# Patient Record
Sex: Male | Born: 1945 | Race: White | Hispanic: No | Marital: Married | Smoking: Never smoker
Health system: Southern US, Community
[De-identification: ages and names within clinical notes are randomized; demographics above are authoritative.]

## PROBLEM LIST (undated history)

## (undated) DIAGNOSIS — N184 Chronic kidney disease, stage 4 (severe): Secondary | ICD-10-CM

## (undated) DIAGNOSIS — I469 Cardiac arrest, cause unspecified: Secondary | ICD-10-CM

## (undated) DIAGNOSIS — I10 Essential (primary) hypertension: Secondary | ICD-10-CM

## (undated) DIAGNOSIS — I2510 Atherosclerotic heart disease of native coronary artery without angina pectoris: Secondary | ICD-10-CM

## (undated) DIAGNOSIS — E039 Hypothyroidism, unspecified: Secondary | ICD-10-CM

## (undated) DIAGNOSIS — K219 Gastro-esophageal reflux disease without esophagitis: Secondary | ICD-10-CM

## (undated) DIAGNOSIS — E119 Type 2 diabetes mellitus without complications: Secondary | ICD-10-CM

## (undated) DIAGNOSIS — E785 Hyperlipidemia, unspecified: Secondary | ICD-10-CM

## (undated) DIAGNOSIS — R4182 Altered mental status, unspecified: Secondary | ICD-10-CM

## (undated) DIAGNOSIS — Z940 Kidney transplant status: Secondary | ICD-10-CM

## (undated) DIAGNOSIS — N400 Enlarged prostate without lower urinary tract symptoms: Secondary | ICD-10-CM

## (undated) DIAGNOSIS — C4490 Unspecified malignant neoplasm of skin, unspecified: Secondary | ICD-10-CM

## (undated) DIAGNOSIS — Z8619 Personal history of other infectious and parasitic diseases: Secondary | ICD-10-CM

## (undated) DIAGNOSIS — I639 Cerebral infarction, unspecified: Secondary | ICD-10-CM

## (undated) HISTORY — DX: Hypothyroidism, unspecified: E03.9

## (undated) HISTORY — DX: Altered mental status, unspecified: R41.82

## (undated) HISTORY — PX: KIDNEY TRANSPLANT: SHX239

## (undated) HISTORY — DX: Benign prostatic hyperplasia without lower urinary tract symptoms: N40.0

## (undated) HISTORY — DX: Cerebral infarction, unspecified: I63.9

## (undated) HISTORY — DX: Atherosclerotic heart disease of native coronary artery without angina pectoris: I25.10

## (undated) HISTORY — DX: Essential (primary) hypertension: I10

## (undated) HISTORY — DX: Type 2 diabetes mellitus without complications: E11.9

## (undated) HISTORY — PX: EYE SURGERY: SHX253

## (undated) HISTORY — DX: Hyperlipidemia, unspecified: E78.5

## (undated) HISTORY — DX: Unspecified malignant neoplasm of skin, unspecified: C44.90

---

## 1997-07-13 ENCOUNTER — Encounter: Payer: Self-pay | Admitting: Family Medicine

## 1998-07-13 ENCOUNTER — Encounter: Payer: Self-pay | Admitting: Family Medicine

## 1999-01-26 ENCOUNTER — Ambulatory Visit (HOSPITAL_COMMUNITY): Admission: RE | Admit: 1999-01-26 | Discharge: 1999-01-26 | Payer: Self-pay | Admitting: Family Medicine

## 1999-01-26 ENCOUNTER — Encounter: Payer: Self-pay | Admitting: Family Medicine

## 1999-07-14 ENCOUNTER — Encounter: Payer: Self-pay | Admitting: Family Medicine

## 1999-07-14 LAB — CONVERTED CEMR LAB: PSA: 1.4 ng/mL

## 2000-06-12 ENCOUNTER — Encounter: Payer: Self-pay | Admitting: Family Medicine

## 2000-06-12 LAB — CONVERTED CEMR LAB: PSA: 1.6 ng/mL

## 2001-07-13 ENCOUNTER — Encounter: Payer: Self-pay | Admitting: Family Medicine

## 2002-09-12 ENCOUNTER — Encounter: Payer: Self-pay | Admitting: Family Medicine

## 2002-09-12 HISTORY — PX: CARDIAC CATHETERIZATION: SHX172

## 2002-09-12 LAB — CONVERTED CEMR LAB: PSA: 1.7 ng/mL

## 2002-12-10 ENCOUNTER — Encounter: Payer: Self-pay | Admitting: *Deleted

## 2002-12-10 ENCOUNTER — Ambulatory Visit (HOSPITAL_COMMUNITY): Admission: RE | Admit: 2002-12-10 | Discharge: 2002-12-10 | Payer: Self-pay | Admitting: *Deleted

## 2003-12-12 ENCOUNTER — Encounter: Payer: Self-pay | Admitting: Family Medicine

## 2003-12-12 LAB — CONVERTED CEMR LAB: PSA: 1.3 ng/mL

## 2004-07-26 ENCOUNTER — Ambulatory Visit: Payer: Self-pay | Admitting: Family Medicine

## 2005-06-10 ENCOUNTER — Ambulatory Visit: Payer: Self-pay | Admitting: Family Medicine

## 2005-07-04 ENCOUNTER — Ambulatory Visit: Payer: Self-pay | Admitting: Family Medicine

## 2005-07-13 ENCOUNTER — Encounter: Payer: Self-pay | Admitting: Family Medicine

## 2005-07-18 ENCOUNTER — Ambulatory Visit: Payer: Self-pay | Admitting: Family Medicine

## 2005-07-25 ENCOUNTER — Ambulatory Visit: Payer: Self-pay | Admitting: Family Medicine

## 2005-08-23 ENCOUNTER — Ambulatory Visit: Payer: Self-pay | Admitting: Family Medicine

## 2005-11-22 ENCOUNTER — Ambulatory Visit: Payer: Self-pay | Admitting: Family Medicine

## 2005-11-24 ENCOUNTER — Ambulatory Visit: Payer: Self-pay | Admitting: Family Medicine

## 2006-02-14 ENCOUNTER — Ambulatory Visit: Payer: Self-pay | Admitting: Family Medicine

## 2006-02-24 ENCOUNTER — Ambulatory Visit: Payer: Self-pay | Admitting: Family Medicine

## 2006-03-27 ENCOUNTER — Ambulatory Visit: Payer: Self-pay | Admitting: Family Medicine

## 2006-05-08 ENCOUNTER — Ambulatory Visit: Payer: Self-pay | Admitting: Family Medicine

## 2006-06-02 ENCOUNTER — Ambulatory Visit: Payer: Self-pay | Admitting: Family Medicine

## 2006-06-07 ENCOUNTER — Emergency Department (HOSPITAL_COMMUNITY): Admission: EM | Admit: 2006-06-07 | Discharge: 2006-06-07 | Payer: Self-pay | Admitting: Emergency Medicine

## 2006-06-14 ENCOUNTER — Ambulatory Visit: Payer: Self-pay | Admitting: Otolaryngology

## 2006-06-20 ENCOUNTER — Ambulatory Visit: Payer: Self-pay | Admitting: Internal Medicine

## 2006-06-26 ENCOUNTER — Ambulatory Visit: Payer: Self-pay | Admitting: Family Medicine

## 2006-06-29 ENCOUNTER — Encounter: Payer: Self-pay | Admitting: Family Medicine

## 2006-07-03 ENCOUNTER — Ambulatory Visit: Payer: Self-pay | Admitting: Family Medicine

## 2006-07-13 ENCOUNTER — Encounter: Payer: Self-pay | Admitting: Family Medicine

## 2006-07-27 ENCOUNTER — Ambulatory Visit: Payer: Self-pay | Admitting: Family Medicine

## 2006-07-27 LAB — CONVERTED CEMR LAB
Microalbumin U total vol: 360.4 mg/L
PSA: 2.02 ng/mL

## 2006-07-31 ENCOUNTER — Ambulatory Visit: Payer: Self-pay | Admitting: Family Medicine

## 2006-08-10 ENCOUNTER — Ambulatory Visit: Payer: Self-pay | Admitting: Family Medicine

## 2006-08-11 ENCOUNTER — Ambulatory Visit: Payer: Self-pay | Admitting: Family Medicine

## 2006-09-12 ENCOUNTER — Encounter: Payer: Self-pay | Admitting: Family Medicine

## 2006-09-28 ENCOUNTER — Ambulatory Visit: Payer: Self-pay | Admitting: Family Medicine

## 2006-09-28 LAB — CONVERTED CEMR LAB
BUN: 29 mg/dL — ABNORMAL HIGH (ref 6–23)
CO2: 27 meq/L (ref 19–32)
Calcium: 9.6 mg/dL (ref 8.4–10.5)
Chloride: 105 meq/L (ref 96–112)
Creatinine, Ser: 2.8 mg/dL — ABNORMAL HIGH (ref 0.4–1.5)
GFR calc Af Amer: 30 mL/min
GFR calc non Af Amer: 25 mL/min
Glucose, Bld: 102 mg/dL — ABNORMAL HIGH (ref 70–99)
Hgb A1c MFr Bld: 6.3 % — ABNORMAL HIGH (ref 4.6–6.0)
Potassium: 4.5 meq/L (ref 3.5–5.1)
Sodium: 138 meq/L (ref 135–145)

## 2006-10-02 ENCOUNTER — Ambulatory Visit: Payer: Self-pay | Admitting: Family Medicine

## 2006-12-27 ENCOUNTER — Ambulatory Visit: Payer: Self-pay | Admitting: Family Medicine

## 2006-12-27 LAB — CONVERTED CEMR LAB
BUN: 35 mg/dL — ABNORMAL HIGH (ref 6–23)
CO2: 27 meq/L (ref 19–32)
Calcium: 9.3 mg/dL (ref 8.4–10.5)
Chloride: 108 meq/L (ref 96–112)
Creatinine, Ser: 3.1 mg/dL — ABNORMAL HIGH (ref 0.4–1.5)
GFR calc Af Amer: 27 mL/min
GFR calc non Af Amer: 22 mL/min
Glucose, Bld: 109 mg/dL — ABNORMAL HIGH (ref 70–99)
Hgb A1c MFr Bld: 6.7 %
Hgb A1c MFr Bld: 6.7 % — ABNORMAL HIGH (ref 4.6–6.0)
Potassium: 4.6 meq/L (ref 3.5–5.1)
Sodium: 140 meq/L (ref 135–145)

## 2007-01-01 ENCOUNTER — Ambulatory Visit: Payer: Self-pay | Admitting: Family Medicine

## 2007-03-29 ENCOUNTER — Ambulatory Visit: Payer: Self-pay | Admitting: Family Medicine

## 2007-03-29 DIAGNOSIS — E782 Mixed hyperlipidemia: Secondary | ICD-10-CM

## 2007-03-29 DIAGNOSIS — N400 Enlarged prostate without lower urinary tract symptoms: Secondary | ICD-10-CM

## 2007-03-29 DIAGNOSIS — E039 Hypothyroidism, unspecified: Secondary | ICD-10-CM | POA: Insufficient documentation

## 2007-03-29 DIAGNOSIS — E119 Type 2 diabetes mellitus without complications: Secondary | ICD-10-CM | POA: Insufficient documentation

## 2007-03-29 DIAGNOSIS — K649 Unspecified hemorrhoids: Secondary | ICD-10-CM | POA: Insufficient documentation

## 2007-03-29 DIAGNOSIS — I6350 Cerebral infarction due to unspecified occlusion or stenosis of unspecified cerebral artery: Secondary | ICD-10-CM | POA: Insufficient documentation

## 2007-03-29 DIAGNOSIS — Q613 Polycystic kidney, unspecified: Secondary | ICD-10-CM

## 2007-03-29 DIAGNOSIS — I2510 Atherosclerotic heart disease of native coronary artery without angina pectoris: Secondary | ICD-10-CM | POA: Insufficient documentation

## 2007-03-29 DIAGNOSIS — I10 Essential (primary) hypertension: Secondary | ICD-10-CM | POA: Insufficient documentation

## 2007-03-29 LAB — CONVERTED CEMR LAB
CO2: 27 meq/L (ref 19–32)
GFR calc Af Amer: 27 mL/min
Glucose, Bld: 92 mg/dL (ref 70–99)
Potassium: 4.7 meq/L (ref 3.5–5.1)
Sodium: 140 meq/L (ref 135–145)

## 2007-04-02 ENCOUNTER — Ambulatory Visit: Payer: Self-pay | Admitting: Family Medicine

## 2007-04-02 DIAGNOSIS — N529 Male erectile dysfunction, unspecified: Secondary | ICD-10-CM

## 2007-06-19 ENCOUNTER — Ambulatory Visit: Payer: Self-pay | Admitting: Family Medicine

## 2007-06-21 ENCOUNTER — Ambulatory Visit: Payer: Self-pay | Admitting: Family Medicine

## 2007-08-17 ENCOUNTER — Telehealth: Payer: Self-pay | Admitting: Family Medicine

## 2007-09-03 ENCOUNTER — Telehealth (INDEPENDENT_AMBULATORY_CARE_PROVIDER_SITE_OTHER): Payer: Self-pay | Admitting: *Deleted

## 2007-09-13 DIAGNOSIS — I639 Cerebral infarction, unspecified: Secondary | ICD-10-CM

## 2007-09-13 HISTORY — DX: Cerebral infarction, unspecified: I63.9

## 2007-09-17 ENCOUNTER — Telehealth: Payer: Self-pay | Admitting: Family Medicine

## 2007-10-04 ENCOUNTER — Ambulatory Visit: Payer: Self-pay | Admitting: Family Medicine

## 2007-10-04 LAB — CONVERTED CEMR LAB
Alkaline Phosphatase: 44 units/L (ref 39–117)
BUN: 36 mg/dL — ABNORMAL HIGH (ref 6–23)
Bilirubin, Direct: 0.1 mg/dL (ref 0.0–0.3)
CO2: 25 meq/L (ref 19–32)
Eosinophils Absolute: 0.3 10*3/uL (ref 0.0–0.6)
Eosinophils Relative: 6.1 % — ABNORMAL HIGH (ref 0.0–5.0)
GFR calc Af Amer: 24 mL/min
GFR calc non Af Amer: 20 mL/min
HCT: 38.5 % — ABNORMAL LOW (ref 39.0–52.0)
Hgb A1c MFr Bld: 6.2 % — ABNORMAL HIGH (ref 4.6–6.0)
LDL Cholesterol: 91 mg/dL (ref 0–99)
Microalb Creat Ratio: 343.2 mg/g — ABNORMAL HIGH (ref 0.0–30.0)
Microalb, Ur: 51.2 mg/dL — ABNORMAL HIGH (ref 0.0–1.9)
Monocytes Absolute: 0.5 10*3/uL (ref 0.2–0.7)
Neutro Abs: 3.1 10*3/uL (ref 1.4–7.7)
Potassium: 4.6 meq/L (ref 3.5–5.1)
RBC: 4.59 M/uL (ref 4.22–5.81)
RDW: 13.1 % (ref 11.5–14.6)
Total CHOL/HDL Ratio: 5.3
Total Protein: 6.5 g/dL (ref 6.0–8.3)
WBC: 5.1 10*3/uL (ref 4.5–10.5)

## 2007-10-09 ENCOUNTER — Ambulatory Visit: Payer: Self-pay | Admitting: Family Medicine

## 2007-10-17 ENCOUNTER — Telehealth (INDEPENDENT_AMBULATORY_CARE_PROVIDER_SITE_OTHER): Payer: Self-pay | Admitting: *Deleted

## 2007-10-18 ENCOUNTER — Ambulatory Visit: Payer: Self-pay | Admitting: Family Medicine

## 2007-10-18 LAB — FECAL OCCULT BLOOD, GUAIAC: Fecal Occult Blood: NEGATIVE

## 2007-10-19 ENCOUNTER — Encounter (INDEPENDENT_AMBULATORY_CARE_PROVIDER_SITE_OTHER): Payer: Self-pay | Admitting: *Deleted

## 2008-01-02 ENCOUNTER — Ambulatory Visit: Payer: Self-pay | Admitting: Family Medicine

## 2008-01-03 LAB — CONVERTED CEMR LAB
BUN: 40 mg/dL — ABNORMAL HIGH (ref 6–23)
CO2: 27 meq/L (ref 19–32)
Chloride: 104 meq/L (ref 96–112)
Creatinine, Ser: 3.8 mg/dL — ABNORMAL HIGH (ref 0.4–1.5)

## 2008-01-07 ENCOUNTER — Ambulatory Visit: Payer: Self-pay | Admitting: Family Medicine

## 2008-01-09 ENCOUNTER — Encounter: Payer: Self-pay | Admitting: Family Medicine

## 2008-01-21 ENCOUNTER — Telehealth: Payer: Self-pay | Admitting: Family Medicine

## 2008-06-25 ENCOUNTER — Ambulatory Visit: Payer: Self-pay | Admitting: Family Medicine

## 2008-06-26 LAB — CONVERTED CEMR LAB
ALT: 25 units/L (ref 0–53)
AST: 28 units/L (ref 0–37)
BUN: 39 mg/dL — ABNORMAL HIGH (ref 6–23)
Basophils Absolute: 0 10*3/uL (ref 0.0–0.1)
Basophils Relative: 0.9 % (ref 0.0–3.0)
CO2: 26 meq/L (ref 19–32)
Chloride: 107 meq/L (ref 96–112)
Creatinine, Ser: 3.6 mg/dL — ABNORMAL HIGH (ref 0.4–1.5)
Creatinine,U: 109 mg/dL
Eosinophils Relative: 6.9 % — ABNORMAL HIGH (ref 0.0–5.0)
LDL Cholesterol: 89 mg/dL (ref 0–99)
Lymphocytes Relative: 25.3 % (ref 12.0–46.0)
Monocytes Relative: 9.3 % (ref 3.0–12.0)
Neutrophils Relative %: 57.6 % (ref 43.0–77.0)
Phosphorus: 4.3 mg/dL (ref 2.3–4.6)
RBC: 4.48 M/uL (ref 4.22–5.81)
TSH: 2.08 microintl units/mL (ref 0.35–5.50)
Total Bilirubin: 1 mg/dL (ref 0.3–1.2)
Total CHOL/HDL Ratio: 5.5
VLDL: 38 mg/dL (ref 0–40)
Vit D, 1,25-Dihydroxy: 28 — ABNORMAL LOW (ref 30–89)
WBC: 4.9 10*3/uL (ref 4.5–10.5)

## 2008-06-30 ENCOUNTER — Ambulatory Visit: Payer: Self-pay | Admitting: Family Medicine

## 2008-08-05 ENCOUNTER — Encounter: Payer: Self-pay | Admitting: Family Medicine

## 2008-08-29 ENCOUNTER — Telehealth: Payer: Self-pay | Admitting: Family Medicine

## 2008-09-08 ENCOUNTER — Telehealth: Payer: Self-pay | Admitting: Family Medicine

## 2008-09-29 ENCOUNTER — Ambulatory Visit: Payer: Self-pay | Admitting: Family Medicine

## 2008-10-07 ENCOUNTER — Ambulatory Visit: Payer: Self-pay | Admitting: Family Medicine

## 2008-10-07 ENCOUNTER — Ambulatory Visit: Payer: Self-pay | Admitting: Internal Medicine

## 2008-10-13 ENCOUNTER — Ambulatory Visit: Payer: Self-pay | Admitting: Internal Medicine

## 2008-10-13 LAB — HM COLONOSCOPY

## 2008-11-24 ENCOUNTER — Encounter: Payer: Self-pay | Admitting: Family Medicine

## 2008-12-09 ENCOUNTER — Encounter: Payer: Self-pay | Admitting: Family Medicine

## 2008-12-10 ENCOUNTER — Encounter: Payer: Self-pay | Admitting: Family Medicine

## 2008-12-24 ENCOUNTER — Telehealth: Payer: Self-pay | Admitting: Family Medicine

## 2008-12-24 ENCOUNTER — Ambulatory Visit: Payer: Self-pay | Admitting: Family Medicine

## 2008-12-25 LAB — CONVERTED CEMR LAB
Calcium: 9.3 mg/dL (ref 8.4–10.5)
Chloride: 106 meq/L (ref 96–112)
Creatinine, Ser: 5.7 mg/dL (ref 0.4–1.5)
Hgb A1c MFr Bld: 6.7 % — ABNORMAL HIGH (ref 4.6–6.5)

## 2008-12-26 ENCOUNTER — Encounter: Payer: Self-pay | Admitting: Family Medicine

## 2008-12-29 ENCOUNTER — Ambulatory Visit: Payer: Self-pay | Admitting: Family Medicine

## 2009-03-03 ENCOUNTER — Encounter: Payer: Self-pay | Admitting: Family Medicine

## 2009-03-23 ENCOUNTER — Telehealth: Payer: Self-pay | Admitting: Family Medicine

## 2009-06-24 ENCOUNTER — Ambulatory Visit: Payer: Self-pay | Admitting: Family Medicine

## 2009-06-24 LAB — CONVERTED CEMR LAB
BUN: 39 mg/dL — ABNORMAL HIGH (ref 6–23)
Basophils Absolute: 0 10*3/uL (ref 0.0–0.1)
Cholesterol: 146 mg/dL (ref 0–200)
Creatinine, Ser: 4.1 mg/dL — ABNORMAL HIGH (ref 0.4–1.5)
Creatinine,U: 103.3 mg/dL
Eosinophils Absolute: 0.4 10*3/uL (ref 0.0–0.7)
GFR calc non Af Amer: 15.73 mL/min (ref >60)
HDL: 29.5 mg/dL — ABNORMAL LOW (ref >39.00)
Hgb A1c MFr Bld: 6.8 % — ABNORMAL HIGH (ref 4.6–6.5)
LDL Cholesterol: 85 mg/dL (ref 0–99)
Lymphocytes Relative: 14.5 % (ref 12.0–46.0)
Lymphs Abs: 1.1 10*3/uL (ref 0.7–4.0)
Microalb Creat Ratio: 514 mg/g — ABNORMAL HIGH (ref 0.0–30.0)
Monocytes Relative: 11.3 % (ref 3.0–12.0)
Phosphorus: 4.7 mg/dL — ABNORMAL HIGH (ref 2.3–4.6)
Platelets: 177 10*3/uL (ref 150.0–400.0)
RDW: 12 % (ref 11.5–14.6)
Total Bilirubin: 0.9 mg/dL (ref 0.3–1.2)
Triglycerides: 159 mg/dL — ABNORMAL HIGH (ref 0.0–149.0)
VLDL: 31.8 mg/dL (ref 0.0–40.0)

## 2009-06-25 LAB — CONVERTED CEMR LAB: Vit D, 25-Hydroxy: 27 ng/mL — ABNORMAL LOW (ref 30–89)

## 2009-07-01 ENCOUNTER — Ambulatory Visit: Payer: Self-pay | Admitting: Family Medicine

## 2009-07-03 ENCOUNTER — Telehealth: Payer: Self-pay | Admitting: Family Medicine

## 2009-07-28 ENCOUNTER — Telehealth: Payer: Self-pay | Admitting: Family Medicine

## 2009-08-31 ENCOUNTER — Telehealth: Payer: Self-pay | Admitting: Family Medicine

## 2009-09-15 ENCOUNTER — Encounter: Payer: Self-pay | Admitting: Family Medicine

## 2009-10-28 ENCOUNTER — Telehealth: Payer: Self-pay | Admitting: Family Medicine

## 2010-02-16 ENCOUNTER — Encounter: Payer: Self-pay | Admitting: Family Medicine

## 2010-04-15 ENCOUNTER — Encounter (INDEPENDENT_AMBULATORY_CARE_PROVIDER_SITE_OTHER): Payer: Self-pay | Admitting: *Deleted

## 2010-05-13 ENCOUNTER — Ambulatory Visit: Payer: Self-pay | Admitting: Oncology

## 2010-06-03 ENCOUNTER — Ambulatory Visit: Payer: Self-pay | Admitting: Oncology

## 2010-06-03 ENCOUNTER — Encounter: Payer: Self-pay | Admitting: Family Medicine

## 2010-06-12 ENCOUNTER — Ambulatory Visit: Payer: Self-pay | Admitting: Oncology

## 2010-06-23 ENCOUNTER — Ambulatory Visit: Payer: Self-pay | Admitting: Family Medicine

## 2010-06-23 ENCOUNTER — Telehealth (INDEPENDENT_AMBULATORY_CARE_PROVIDER_SITE_OTHER): Payer: Self-pay | Admitting: *Deleted

## 2010-06-23 LAB — CONVERTED CEMR LAB
AST: 30 units/L (ref 0–37)
Alkaline Phosphatase: 58 units/L (ref 39–117)
Basophils Absolute: 0 10*3/uL (ref 0.0–0.1)
Bilirubin, Direct: 0.1 mg/dL (ref 0.0–0.3)
Calcium: 9.2 mg/dL (ref 8.4–10.5)
Creatinine, Ser: 4 mg/dL — ABNORMAL HIGH (ref 0.4–1.5)
Creatinine,U: 93.2 mg/dL
Eosinophils Absolute: 0.2 10*3/uL (ref 0.0–0.7)
Free T4: 0.65 ng/dL (ref 0.60–1.60)
GFR calc non Af Amer: 16.09 mL/min (ref >60)
Glucose, Bld: 97 mg/dL (ref 70–99)
HDL: 32.6 mg/dL — ABNORMAL LOW (ref >39.00)
Hemoglobin: 11.8 g/dL — ABNORMAL LOW (ref 13.0–17.0)
Lymphocytes Relative: 16.2 % (ref 12.0–46.0)
MCHC: 33.6 g/dL (ref 30.0–36.0)
Microalb, Ur: 49.5 mg/dL — ABNORMAL HIGH (ref 0.0–1.9)
Monocytes Relative: 9.2 % (ref 3.0–12.0)
Neutro Abs: 3.4 10*3/uL (ref 1.4–7.7)
Neutrophils Relative %: 70.6 % (ref 43.0–77.0)
PSA: 2.58 ng/mL (ref 0.10–4.00)
Platelets: 195 10*3/uL (ref 150.0–400.0)
RDW: 13.3 % (ref 11.5–14.6)
Sodium: 141 meq/L (ref 135–145)
Total Bilirubin: 0.9 mg/dL (ref 0.3–1.2)
Triglycerides: 68 mg/dL (ref 0.0–149.0)
VLDL: 13.6 mg/dL (ref 0.0–40.0)

## 2010-06-24 LAB — CONVERTED CEMR LAB: Vit D, 25-Hydroxy: 36 ng/mL (ref 30–89)

## 2010-07-08 ENCOUNTER — Ambulatory Visit: Payer: Self-pay | Admitting: Family Medicine

## 2010-07-09 ENCOUNTER — Encounter: Payer: Self-pay | Admitting: Family Medicine

## 2010-07-12 ENCOUNTER — Encounter: Payer: Self-pay | Admitting: Family Medicine

## 2010-08-11 ENCOUNTER — Encounter: Payer: Self-pay | Admitting: Family Medicine

## 2010-09-20 ENCOUNTER — Encounter: Payer: Self-pay | Admitting: Family Medicine

## 2010-09-23 ENCOUNTER — Encounter: Payer: Self-pay | Admitting: Family Medicine

## 2010-09-23 LAB — CONVERTED CEMR LAB
ALT: 24 units/L
AST: 23 units/L
Albumin: 3.5 g/dL
Alkaline Phosphatase: 112 units/L
Calcium: 8.7 mg/dL
Chloride: 108 meq/L
Potassium: 4.6 meq/L
Sodium: 139 meq/L
Total Protein: 5.7 g/dL

## 2010-09-24 ENCOUNTER — Encounter: Payer: Self-pay | Admitting: Family Medicine

## 2010-10-01 ENCOUNTER — Encounter: Payer: Self-pay | Admitting: Family Medicine

## 2010-10-04 ENCOUNTER — Encounter: Payer: Self-pay | Admitting: Family Medicine

## 2010-10-06 ENCOUNTER — Encounter: Payer: Self-pay | Admitting: Family Medicine

## 2010-10-07 ENCOUNTER — Encounter: Payer: Self-pay | Admitting: Family Medicine

## 2010-10-11 ENCOUNTER — Encounter: Payer: Self-pay | Admitting: Family Medicine

## 2010-10-14 LAB — CONVERTED CEMR LAB
Albumin: 2.7 g/dL
Albumin: 2.8 g/dL
BUN: 56 mg/dL
Calcium: 8.3 mg/dL
Magnesium: 1.3 mg/dL
Phosphorus: 4.4 mg/dL
Total Protein: 4.8 g/dL
Total Protein: 4.9 g/dL

## 2010-10-14 NOTE — Progress Notes (Signed)
Summary: Pt requests phone call  Phone Note Call from Patient Call back at [phone]   Caller: Patient Call For: Shaune Leeks MD Summary of Call: Pt is asking that you call him regarding a personal matter. Initial call taken by: Lowella Petties CMA,  October 28, 2009 3:15 PM    New/Updated Medications: LEVITRA 5 MG TABS (VARDENAFIL HCL) one tab by mouth 1 hr prior to desired activity. Prescriptions: LEVITRA 5 MG TABS (VARDENAFIL HCL) one tab by mouth 1 hr prior to desired activity.  #5 x 0   Entered and Authorized by:   Shaune Leeks MD   Signed by:   Shaune Leeks MD on 10/28/2009   Method used:   Electronically to        [facility]  [employer] [phone]* (retail)       [address] [place], [place]  [address]       Ph: [phone] or [phone]       Fax: [phone]   RxID:   [id number]

## 2010-10-14 NOTE — Letter (Signed)
Summary: [facility]   Imported By: Lanelle Bal 10/01/2009 09:00:35  _____________________________________________________________________  External Attachment:    Type:   Image     Comment:   External Document

## 2010-10-14 NOTE — Letter (Signed)
Summary: Edward Oconnell letter  [place] at [facility]  [address] [place], [place] [address]   Phone: [phone]  Fax: [phone]       04/15/2010 MRN: [id number]  Edward Oconnell [address] [place], [place]  [address]  Dear Mr. Bernestine [facility] [place], and [facility] announce the retirement of Arta Silence, M.D., from full-time practice at the [facility] office effective March 11, 2010 and his plans of returning part-time.  It is important to Dr. Hetty Ely and to our practice that you understand that [facility] has seven physicians in our office for your health care needs.  We will continue to offer the same exceptional care that you have today.    Dr. Hetty Ely has spoken to many of you about his plans for retirement and returning part-time in the fall.   We will continue to work with you through the transition to schedule appointments for you in the office and meet the high standards that [place] is committed to.   Again, it is with great pleasure that we share the news that Dr. Hetty Ely will return to [facility] in October of 2011 with a reduced schedule.    If you have any questions, or would like to request an appointment with one of our physicians, please call us at [phone] and press the option for Scheduling an appointment.  We take pleasure in providing you with excellent patient care and look forward to seeing you at your next office visit.  Our [facility] Physicians are:  Tillman Abide, M.D. Laurita Quint, M.D. Roxy Manns, M.D. Kerby Nora, M.D. Hannah Beat, M.D. Ruthe Mannan, M.D. We proudly welcomed Raechel Ache, M.D. and Eustaquio Boyden, M.D. to the practice in July/August 2011.  Sincerely,  [facility]

## 2010-10-14 NOTE — Miscellaneous (Signed)
Summary: Lab results from [facility]  Clinical Lists Changes  Observations: Added new observation of PO4: 4.4 mg/dL (84/69/6295 28:41) Added new observation of ALBUMIN: 2.7 g/dL (32/44/0102 72:53) Added new observation of PROTEIN, TOT: 4.9 g/dL (66/44/0347 42:59) Added new observation of CREATININE: 2.9 mg/dL (56/38/7564 33:29) Added new observation of BUN: 56 mg/dL (51/88/4166 06:30) Added new observation of BG RANDOM: 198 mg/dL (16/09/930 35:57) Added new observation of CO2 PLSM/SER: 18 meq/L (10/04/2010 12:32) Added new observation of CL SERUM: 111 meq/L (10/04/2010 12:32) Added new observation of MAGNESIUM: 1.3 mg/dL (32/20/2542 70:62)

## 2010-10-14 NOTE — Miscellaneous (Signed)
Summary: Labs Magnesium and Phosphorus  Clinical Lists Changes  Observations: Added new observation of MAGNESIUM: 1.5 mg/dL (56/21/3086 57:84) Added new observation of PO4: 5.2 mg/dL (69/62/9528 41:32)

## 2010-10-14 NOTE — Letter (Signed)
Summary: Dr.Michael Daniel,Urology,Note  Dr.Michael Daniel,Urology,Note   Imported By: Beau Fanny 08/12/2010 10:18:08  _____________________________________________________________________  External Attachment:    Type:   Image     Comment:   External Document

## 2010-10-14 NOTE — Letter (Signed)
Summary: [facility]  [facility]   Imported By: Maryln Gottron 07/29/2010 13:48:56  _____________________________________________________________________  External Attachment:    Type:   Image     Comment:   External Document  Appended Document: [facility] Pls let pt know, am willing to try this but need to recheck Chol profile in 3 mos time.  Appended Document: [facility] Patient notified as instructed by telephone. Was informed by patient that he already has an appointment set up for the recheck.

## 2010-10-14 NOTE — Letter (Signed)
Summary: Dr.Mariana Murea,Nephrology,Note  Dr.Mariana Murea,Nephrology,Note   Imported By: Beau Fanny 02/25/2010 10:16:38  _____________________________________________________________________  External Attachment:    Type:   Image     Comment:   External Document

## 2010-10-14 NOTE — Assessment & Plan Note (Signed)
Summary: cpx/dlo   Vital Signs:  Patient profile:   65 year old male Height:      67.75 inches Weight:      181 pounds BMI:     27.82 Temp:     97.9 degrees F oral Pulse rate:   64 / minute Pulse rhythm:   regular BP sitting:   130 / 82  (left arm) Cuff size:   regular  Vitals Entered By: Sydell Axon LPN (July 08, 2010 9:27 AM) CC: 30 Minute checkup, had a colonoscopy 02/10 by Dr. Leone Payor   History of Present Illness: Pt here for Comp Exam. He has recently been seen by Nephrology and some of his meds have been changed. He has had impotence problems. He has tried Cialis etc with no effect.He has crazy dreams at times.   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  Problems Prior to Update: 1)  Special Screening Malignant Neoplasm of Prostate  (ICD-V76.44) 2)  Special Screening Malig Neoplasms Other Sites  (ICD-V76.49) 3)  Health Maintenance Exam  (ICD-V70.0) 4)  Organic Impotence  (ICD-607.84) 5)  Diabetes Mellitus, Type II  (ICD-250.00) 6)  Abnormal Blood Chemistry Nec  (ICD-790.6) 7)  Stroke  (ICD-434.91) 8)  Hemorrhoids  (ICD-455.6) 9)  Polycystic Kidney Disease With Renal Insuff.  (ICD-753.12) 10)  Hyperlipidemia, Mixed  (ICD-272.2) 11)  Benign Prostatic Hypertrophy  (ICD-600.00) 12)  Hypothyroidism  (ICD-244.9) 13)  Hypertension  (ICD-401.9) 14)  Coronary Artery Disease  (ICD-414.00)  Medications Prior to Update: 1)  Diovan 320 Mg Tabs (Valsartan) .... Take 1 Tablet By Mouth Once A Day 2)  Levothyroxine Sodium 137 Mcg Tabs (Levothyroxine Sodium) .... Take 1 Tablet By Mouth Once A Day 3)  Tenex 2 Mg Tabs (Guanfacine Hcl) .... Take 1 Tablet By Mouth Every Night 4)  Ranitidine Hcl 150 Mg Caps (Ranitidine Hcl) .... Take 1 Capsule Twice A  Day 5)  Lipitor 40 Mg Tabs (Atorvastatin Calcium) .... Take One By Mouth At Bedtime 6)  Zetia 10 Mg Tabs (Ezetimibe) .... Take One By Mouth Daily 7)  Sertraline Hcl 100 Mg Tabs (Sertraline Hcl) .... Take One By Mouth Daily 8)  Adult Aspirin Low Strength 81 Mg  Tbdp (Aspirin) .... Take 1 Tablet By Mouth Once A Day 9)  Amlodipine Besylate 10 Mg Tabs (Amlodipine Besylate) .... One Tab By Mouth Daily 10)  Tricor 145 Mg  Tabs (Fenofibrate) .Marland Kitchen.. 1 At Bedtime 11)  Lopressor 50 Mg  Tabs (Metoprolol Tartrate) .Marland Kitchen.. 1 Tablet Twice A Day 12)  Fexofenadine Hcl 180 Mg  Tabs (Fexofenadine Hcl) .Marland Kitchen.. 1 Daily As Needed 13)  Ayurcid .... Digestive Support 14)  Suhruday .... Daily By Mouth 15)  Jivanyog .... Immune Support 16)  Amrutras .... Metabolic Formula 17)  Dr. Amaryllis Dyke Healthy .... Kidney Function 18)  Pure Nutrient 950 Without Iron .... Daily 19)  Kidney Tone .Marland KitchenMarland Kitchen. 1 Daily By Mouth 20)  Uritone .Marland KitchenMarland Kitchen. 1 Daily 21)  Levitra 5 Mg Tabs (Vardenafil Hcl) .... One Tab By Mouth 1 Hr Prior To Desired Activity.  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  Other died from COPD with oxygen dependence. 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  Other died from COPD with oxygen dependence. CV- Sister MI x3 HBP- Father, mother, sister Cancer- + uncles ETOH- + Uncles  Review of Systems General:  Complains of fatigue; denies chills, fever, sweats, weakness, and weight loss. Eyes:  Denies blurring, discharge, and eye pain. ENT:  Denies decreased hearing, ear discharge, earache, and ringing in ears. CV:  Complains of fatigue and shortness of breath with exertion; denies chest pain or discomfort, fainting, palpitations, swelling of feet, and swelling of hands; chronic . Resp:  Denies cough, shortness of breath, and wheezing. GI:  Denies abdominal pain, bloody stools, change in bowel habits, constipation, dark tarry stools, diarrhea, indigestion, loss of appetite, nausea, vomiting, vomiting blood, and yellowish skin color. GU:  Complains of nocturia; denies dysuria, hematuria, and urinary frequency; three times a night, difficulty with erections.. MS:  Complains of cramps; denies joint pain, muscle aches, and  stiffness; occas. Derm:  Denies dryness, itching, and rash. Neuro:  Denies numbness, poor balance, tingling, and tremors.  Physical Exam  General:  Well-developed,well-nourished,in no acute distress; alert,appropriate and cooperative throughout examination. Head:  Normocephalic and atraumatic without obvious abnormalities. No apparent alopecia or balding. Sinuses NT. Eyes:  Conjunctiva clear bilaterally.  Ears:  External ear exam shows no significant lesions or deformities.  Otoscopic examination reveals clear canals, tympanic membranes are intact bilaterally without bulging, retraction, inflammation or discharge. Hearing is grossly normal bilaterally. Nose:  External nasal examination shows no deformity or inflammation. Nasal mucosa are pink and moist without lesions. Mild clear mucous. Mouth:  Oral mucosa and oropharynx without lesions or exudates.  Teeth in good repair. Clear thick PND. Neck:  No deformities, masses, or tenderness noted. Chest Wall:  No deformities, masses, tenderness or gynecomastia noted. Breasts:  No masses or gynecomastia noted Lungs:  Normal respiratory effort, chest expands symmetrically. Lungs are clear to auscultation, no crackles or wheezes. Heart:  Normal rate  and regular rhythm. S1 and S2 normal without gallop, murmur, click, rub or other extra sounds. Abdomen:  Bowel sounds positive,abdomen soft and non-tender without masses, organomegaly or hernias noted. Rectal:  No external abnormalities noted. Normal sphincter tone. No rectal masses or tenderness. G neg. Genitalia:  Testes bilaterally descended without nodularity, tenderness or masses. No scrotal masses or lesions. No penis lesions or urethral discharge. Prostate:  Prostate gland firm and smooth, no enlargement, nodularity, tenderness, mass, asymmetry or induration. 20-30 gms. Msk:  No deformity or scoliosis noted of thoracic or lumbar spine.   Pulses:  R and L carotid,radial,femoral,dorsalis pedis and  posterior tibial pulses are full and equal bilaterally Extremities:  No clubbing, cyanosis, edema, or deformity noted with normal full range of motion of all joints.   Neurologic:  No cranial nerve deficits noted. Station and gait are normal. Sensory, motor and coordinative functions appear intact. Skin:  Intact without suspicious lesions or rashes Cervical Nodes:  No lymphadenopathy noted Inguinal Nodes:  No significant adenopathy Psych:  Cognition and judgment appear intact. Alert and cooperative with normal attention span and concentration. No apparent delusions, illusions, hallucinations  Diabetes Management Exam:    Foot Exam (with socks and/or shoes not present):       Sensory-Monofilament:          Left foot: diminished          Right foot: diminished       Inspection:          Left foot: normal          Right foot: normal   Impression & Recommendations:  Problem # 1:  HEALTH MAINTENANCE EXAM (ICD-V70.0)  Reviewed preventive care protocols, scheduled due services, and updated immunizations. Flu shot today.  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 The following medications were removed from the medication list:    Levitra 5 Mg Tabs (Vardenafil hcl) ..... One tab by mouth 1 hr prior to desired activity.  Orders: Urology Referral (Urology)  Problem # 3:  DIABETES MELLITUS, TYPE II (ICD-250.00) Assessment: Improved  Great control. Cont diet and activity. The following medications were removed from the medication list:    Diovan 320 Mg Tabs (Valsartan) .Marland Kitchen... Take 1 tablet by mouth once a day His updated medication list for this problem includes:    Adult Aspirin Low Strength 81 Mg Tbdp (Aspirin) .Marland Kitchen... Take 1 tablet by mouth once a day  Labs Reviewed: Creat: 4.0 (06/23/2010)   Microalbumin: 360.4 (07/27/2006)  Last Eye Exam: normal (09/12/2006) Reviewed HgBA1c results: 5.9 (06/23/2010)  6.8 (06/24/2009)  Problem #  4:  STROKE (ICD-434.91) Assessment: Unchanged Stable.  His updated medication list for this problem includes:    Adult Aspirin Low Strength 81 Mg Tbdp (Aspirin) .Marland Kitchen... Take 1 tablet by mouth once a day  Problem # 5:  HEMORRHOIDS (ICD-455.6) Assessment: Unchanged Stable.  Problem # 6:  POLYCYSTIC KIDNEY DISEASE WITH RENAL INSUFF. (ICD-753.12) Assessment: Unchanged Nos slightly improved...wax and wane. Cont to  follow. Is on transplant list.  Problem # 7:  HYPERLIPIDEMIA, MIXED (ICD-272.2) HDL slightly low but other nos great! Cont curr tx. The following medications were removed from the medication list:    Tricor 145 Mg Tabs (Fenofibrate) .Marland Kitchen... 1 at bedtime His updated medication list for this problem includes:    Lipitor 40 Mg Tabs (Atorvastatin calcium) .Marland Kitchen... Take one by mouth at bedtime    Zetia 10 Mg Tabs (Ezetimibe) .Marland Kitchen... Take one by mouth daily  Labs Reviewed: SGOT: 30 (06/23/2010)   SGPT: 23 (06/23/2010)   HDL:32.60 (06/23/2010), 29.50 (06/24/2009)  LDL:68 (06/23/2010), 85 (06/24/2009)  Chol:114 (06/23/2010), 146 (06/24/2009)  Trig:68.0 (06/23/2010), 159.0 (06/24/2009)  Problem # 8:  BENIGN PROSTATIC HYPERTROPHY (ICD-600.00) Assessment: Unchanged Stable sxs.   Problem # 9:  HYPOTHYROIDISM (ICD-244.9) Assessment: Unchanged Therapeutic per labs and sxs. Cont curr dose. His updated medication list for this problem includes:    Levothyroxine Sodium 137 Mcg Tabs (Levothyroxine sodium) .Marland Kitchen... Take 1 tablet by mouth once a day  Labs Reviewed: TSH: 1.25 (06/23/2010)    HgBA1c: 5.9 (06/23/2010) Chol: 114 (06/23/2010)   HDL: 32.60 (06/23/2010)   LDL: 68 (06/23/2010)   TG: 68.0 (06/23/2010)  Problem # 10:  HYPERTENSION (ICD-401.9) Assessment: Unchanged Stable control. Cont curr meds. The following medications were removed from the medication list:    Diovan 320 Mg Tabs (Valsartan) .Marland Kitchen... Take 1 tablet by mouth once a day    Tenex 2 Mg Tabs (Guanfacine hcl) .Marland Kitchen... Take 1 tablet by  mouth every night    Lopressor 50 Mg Tabs (Metoprolol tartrate) .Marland Kitchen... 1 tablet twice a day His updated medication list for this problem includes:    Amlodipine Besylate 10 Mg Tabs (Amlodipine besylate) ..... One tab by mouth daily    Labetalol Hcl 300 Mg Tabs (Labetalol hcl) .Marland Kitchen... Take one by mouth every am    Hydralazine Hcl 50 Mg Tabs (Hydralazine hcl) .Marland Kitchen... Take one by mouth two times a day  BP today: 130/82 Prior BP: 112/80 (07/01/2009)  Labs Reviewed: K+: 4.8 (06/23/2010) Creat: : 4.0 (06/23/2010)   Chol: 114 (06/23/2010)   HDL: 32.60 (06/23/2010)   LDL: 68 (06/23/2010)   TG: 68.0 (06/23/2010)  Complete Medication List: 1)  Levothyroxine Sodium 137 Mcg Tabs (Levothyroxine sodium) .... Take 1 tablet by mouth once a day 2)  Ranitidine Hcl 150 Mg Caps (Ranitidine hcl) .... Take 1 capsule twice a day 3)  Lipitor 40 Mg Tabs (Atorvastatin calcium) .... Take one by mouth at bedtime 4)  Zetia 10 Mg Tabs (Ezetimibe) .... Take one by mouth daily 5)  Sertraline Hcl 100 Mg Tabs (Sertraline hcl) .... Take one by mouth daily 6)  Adult Aspirin Low Strength 81 Mg Tbdp (Aspirin) .... Take 1 tablet by mouth once a day 7)  Amlodipine Besylate 10 Mg Tabs (Amlodipine besylate) .... One tab by mouth daily 8)  Ayurcid  .... Digestive support 9)  Suhruday  .... Daily by mouth 10)  Jivanyog  .... Immune support 11)  Amrutras  .... Metabolic formula 12)  Dr. Amaryllis Dyke Healthy  .... Kidney function 13)  Pure Nutrient 950 Without Iron  .... Daily 14)  Kidney Tone  .Marland Kitchen.. 1 daily by mouth 15)  Uritone  .Marland Kitchen.. 1 daily 16)  Labetalol Hcl 300 Mg Tabs (Labetalol hcl) .... Take one by mouth every am 17)  Hydralazine Hcl 50 Mg Tabs (Hydralazine hcl) .... Take one by mouth two times a day  Patient Instructions: 1)  Flu shot today.  2)  Refer to Urology. 3)  RTC 6 mos, BMET prior. 4)  Get eye exam for diabetes.   Orders Added: 1)  Urology Referral [Urology] 2)  Est. Patient 40-64 years  [phone]    Prior Medications (reviewed today): LEVOTHYROXINE SODIUM 137 MCG TABS (LEVOTHYROXINE SODIUM) Take 1 tablet by mouth once a day RANITIDINE HCL 150 MG CAPS (RANITIDINE HCL) Take 1 capsule twice a day LIPITOR 40 MG TABS (ATORVASTATIN CALCIUM) Take one by mouth at bedtime ZETIA 10 MG  TABS (EZETIMIBE) Take one by mouth daily SERTRALINE HCL 100 MG TABS (SERTRALINE HCL) Take one by mouth daily ADULT ASPIRIN LOW STRENGTH 81 MG  TBDP (ASPIRIN) Take 1 tablet by mouth once a day AMLODIPINE BESYLATE 10 MG TABS (AMLODIPINE BESYLATE) one tab by mouth daily AYURCID () digestive support SUHRUDAY () DAILY BY MOUTH JIVANYOG () immune support AMRUTRAS () metabolic formula DR. PANKAI NARAMIS HEALTHY () kidney function PURE NUTRIENT 950 WITHOUT IRON () daily KIDNEY TONE () 1 daily by mouth URITONE () 1 daily LABETALOL HCL 300 MG TABS (LABETALOL HCL) Take one by mouth every am HYDRALAZINE HCL 50 MG TABS (HYDRALAZINE HCL) Take one by mouth two times a day Current Allergies (reviewed today): No known allergies

## 2010-10-14 NOTE — Progress Notes (Signed)
----   Converted from flag ---- ---- 06/23/2010 7:11 AM, Shaune Leeks MD wrote: PSA V76.44 A1C MICROALB 250.00 BMET CBC 753.12 CHOL PROF HEPATIC 272.2 TSH T4FR 244.9 VIT D 268.9  ---- 06/22/2010 2:37 PM, Melody Comas wrote: Patient is coming in for cpx labs tomorrow am. What labs to draw and diagnosis please. ------------------------------

## 2010-10-14 NOTE — Miscellaneous (Signed)
Summary: [id number]  Clinical Lists Changes Fk506  10.7 NG/ML  (5.0-20.0)  (10/01/10)

## 2010-10-14 NOTE — Miscellaneous (Signed)
Summary: Lab results from [facility]  Clinical Lists Changes  Observations: Added new observation of CALCIUM: 8.3 mg/dL (16/06/9603 54:09) Added new observation of ALBUMIN: 2.8 g/dL (81/19/1478 29:56) Added new observation of PROTEIN, TOT: 4.8 g/dL (21/30/8657 84:69) Added new observation of CREATININE: 3.2 mg/dL (62/95/2841 32:44) Added new observation of BUN: 57 mg/dL (09/14/7251 66:44)

## 2010-10-14 NOTE — Letter (Signed)
Summary: [facility]  [facility]   Imported By: Lanelle Bal 06/21/2010 10:56:04  _____________________________________________________________________  External Attachment:    Type:   Image     Comment:   External Document

## 2010-10-14 NOTE — Consult Note (Signed)
Summary: Dr.Michael Daniel,[facility],Note  Dr.Michael Daniel,[facility],Note   Imported By: Beau Fanny 07/15/2010 09:19:53  _____________________________________________________________________  External Attachment:    Type:   Image     Comment:   External Document

## 2010-10-14 NOTE — Miscellaneous (Signed)
  Clinical Lists Changes  Observations: Added new observation of MAGNESIUM: 1.5 mg/dL (16/06/9603 54:09) Added new observation of CALCIUM: 8.7 mg/dL (81/19/1478 29:56) Added new observation of ALBUMIN: 3.5 g/dL (21/30/8657 84:69) Added new observation of PROTEIN, TOT: 5.7 g/dL (62/95/2841 32:44) Added new observation of SGPT (ALT): 24 units/L (09/23/2010 17:14) Added new observation of SGOT (AST): 23 units/L (09/23/2010 17:14) Added new observation of ALK PHOS: 112 units/L (09/23/2010 17:14) Added new observation of BILI TOTAL: 0.8 mg/dL (09/14/7251 66:44) Added new observation of CREATININE: 2.33 mg/dL (03/47/4259 56:38) Added new observation of BUN: 28 mg/dL (75/64/3329 51:88) Added new observation of BG RANDOM: 83 mg/dL (41/66/0630 16:01) Added new observation of CO2 PLSM/SER: 20 meq/L (09/23/2010 17:14) Added new observation of CL SERUM: 108 meq/L (09/23/2010 17:14) Added new observation of K SERUM: 4.6 meq/L (09/23/2010 17:14) Added new observation of NA: 139 meq/L (09/23/2010 17:14)

## 2010-10-18 ENCOUNTER — Encounter: Payer: Self-pay | Admitting: Family Medicine

## 2010-10-18 LAB — CONVERTED CEMR LAB
BUN: 35 mg/dL
Magnesium: 1.4 mg/dL
Potassium: 5.6 meq/L

## 2010-10-19 ENCOUNTER — Encounter: Payer: Self-pay | Admitting: Family Medicine

## 2010-10-20 NOTE — Letter (Signed)
Summary: Transplant Clinic Note/[facility]  Transplant Clinic Note/[facility]   Imported By: Sherian Rein 10/13/2010 08:56:08  _____________________________________________________________________  External Attachment:    Type:   Image     Comment:   External Document

## 2010-10-20 NOTE — Letter (Signed)
Summary: Post-transplant/[facility]   Imported By: Lester Teaticket 10/11/2010 09:55:04  _____________________________________________________________________  External Attachment:    Type:   Image     Comment:   External Document  Appended Document: [facility]    Clinical Lists Changes  Observations: Added new observation of PAST SURG HX: ECHO LAE 03/99 Persantine Cardiolite wnl 11/98 MRI/MRA- mild atrophy, sm vess dz secondary HTN 05/00 Stress Cardiolite, + EKG03/23/04 CATH, 70% , LAD lesion x 2 12/10/02 CT Head , negative abnml 06/07/06 MRI Cerv Spine, disc degen o/w  06/14/06 CT Head nml 06/2007 Carotid/ 12/07 Renal U/S 08/21/06 Colonoscopy Int 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  (Dr Leone Payor) 10/13/08     10 yrs Stress Echo w/Doppler Nml [facility]) 12/09/08 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 L Vertebr Art [facility]) 12/09/08 Donor Kidney Transplant [facility] 09/12/10 (10/12/2010 12:40)       Past Surgical History:    ECHO LAE 03/99    Persantine Cardiolite wnl 11/98    MRI/MRA- mild atrophy, sm vess dz secondary HTN 05/00    Stress Cardiolite, + EKG03/23/04    CATH, 70% , LAD lesion x 2 12/10/02    CT Head , negative abnml 06/07/06    MRI Cerv Spine, disc degen o/w  06/14/06    CT Head nml 06/2007    Carotid/ 12/07    Renal U/S 08/21/06    Colonoscopy 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

## 2010-10-21 ENCOUNTER — Encounter: Payer: Self-pay | Admitting: Family Medicine

## 2010-10-21 LAB — CONVERTED CEMR LAB
Creatinine, Ser: 4.28 mg/dL
Phosphorus: 4.6 mg/dL
Total Protein: 5.5 g/dL

## 2010-10-26 ENCOUNTER — Encounter: Payer: Self-pay | Admitting: Family Medicine

## 2010-10-27 ENCOUNTER — Encounter: Payer: Self-pay | Admitting: Family Medicine

## 2010-10-27 LAB — CONVERTED CEMR LAB
BUN: 37 mg/dL
Creatinine, Ser: 3.84 mg/dL
Glucose, Bld: 164 mg/dL
Magnesium: 1.5 mg/dL
Phosphorus: 4 mg/dL
Total Protein: 5.8 g/dL

## 2010-10-28 ENCOUNTER — Encounter: Payer: Self-pay | Admitting: Family Medicine

## 2010-10-28 NOTE — Miscellaneous (Signed)
Summary: Lab results from [facility]  Clinical Lists Changes  Observations: Added new observation of MAGNESIUM: 1.4 mg/dL (45/40/9811 91:47) Added new observation of PO4: 4.7 mg/dL (82/95/6213 08:65) Added new observation of PROTEIN, TOT: 5.6 g/dL (78/46/9629 52:84) Added new observation of CREATININE: 4.04 mg/dL (13/24/4010 27:25) Added new observation of BUN: 35 mg/dL (36/64/4034 74:25) Added new observation of K SERUM: 5.6 meq/L (10/18/2010 13:55)

## 2010-11-03 NOTE — Miscellaneous (Signed)
Summary: Labs from [facility]  Clinical Lists Changes  Observations: Added new observation of MAGNESIUM: 1.5 mg/dL (14/78/2956 21:30) Added new observation of PO4: 4.0 mg/dL (86/57/8469 62:95) Added new observation of PROTEIN, TOT: 5.8 g/dL (28/41/3244 01:02) Added new observation of CREATININE: 3.84 mg/dL (72/53/6644 03:47) Added new observation of BUN: 37 mg/dL (42/59/5638 75:64) Added new observation of BG RANDOM: 164 mg/dL (33/29/5188 41:66)

## 2010-11-03 NOTE — Letter (Signed)
Summary: [facility]   [facility]   Imported By: Maryln Gottron 10/26/2010 15:40:59  _____________________________________________________________________  External Attachment:    Type:   Image     Comment:   External Document

## 2010-11-03 NOTE — Letter (Signed)
Summary: [facility] Discharge Summary  [facility] Discharge Summary   Imported By: Kassie Mends 10/29/2010 09:14:19  _____________________________________________________________________  External Attachment:    Type:   Image     Comment:   External Document

## 2010-11-03 NOTE — Miscellaneous (Signed)
Summary: Labs from [facility]  Clinical Lists Changes  Observations: Added new observation of MAGNESIUM: 1.3 mg/dL (16/06/9603 54:09) Added new observation of PO4: 4.6 mg/dL (81/19/1478 29:56) Added new observation of PROTEIN, TOT: 5.5 g/dL (21/30/8657 84:69) Added new observation of CREATININE: 4.28 mg/dL (62/95/2841 32:44) Added new observation of BUN: 36 mg/dL (09/14/7251 66:44) Added new observation of CO2 PLSM/SER: 18 meq/L (10/21/2010 14:31)

## 2010-11-09 ENCOUNTER — Encounter: Payer: Self-pay | Admitting: Family Medicine

## 2010-11-09 NOTE — Letter (Signed)
Summary: [facility]  [facility]   Imported By: Maryln Gottron 10/29/2010 13:09:38  _____________________________________________________________________  External Attachment:    Type:   Image     Comment:   External Document

## 2010-11-17 ENCOUNTER — Encounter: Payer: Self-pay | Admitting: Family Medicine

## 2010-11-18 NOTE — Letter (Signed)
Summary: Transplant Visit Note/[facility]  Transplant Visit Note/[facility]   Imported By: Sherian Rein 11/08/2010 09:58:16  _____________________________________________________________________  External Attachment:    Type:   Image     Comment:   External Document

## 2010-11-18 NOTE — Letter (Signed)
Summary: [facility]   Imported By: Kassie Mends 11/12/2010 08:38:50  _____________________________________________________________________  External Attachment:    Type:   Image     Comment:   External Document

## 2010-11-23 NOTE — Letter (Signed)
Summary: [facility]   Imported By: Kassie Mends 11/15/2010 10:10:38  _____________________________________________________________________  External Attachment:    Type:   Image     Comment:   External Document

## 2010-11-30 ENCOUNTER — Encounter: Payer: Self-pay | Admitting: Family Medicine

## 2010-12-08 ENCOUNTER — Encounter: Payer: Self-pay | Admitting: Family Medicine

## 2010-12-23 ENCOUNTER — Encounter: Payer: Self-pay | Admitting: Family Medicine

## 2010-12-28 ENCOUNTER — Ambulatory Visit: Payer: Medicare Other | Admitting: Oncology

## 2010-12-29 ENCOUNTER — Other Ambulatory Visit: Payer: Self-pay | Admitting: *Deleted

## 2010-12-29 DIAGNOSIS — I10 Essential (primary) hypertension: Secondary | ICD-10-CM

## 2011-01-10 ENCOUNTER — Other Ambulatory Visit: Payer: Self-pay

## 2011-01-11 ENCOUNTER — Ambulatory Visit: Payer: Medicare Other | Admitting: Oncology

## 2011-01-13 ENCOUNTER — Ambulatory Visit: Payer: Self-pay | Admitting: Family Medicine

## 2011-01-28 NOTE — Assessment & Plan Note (Signed)
[facility]                             [place] OFFICE NOTE   NAME:Edward Oconnell, ELIM ECONOMOU                        MRN:          [id number]  DATE:07/31/2006                            DOB:          [birth year]    A 65 year old white male here for a health maintenance physical exam.  He  really has no problems today but has developed dizziness approximately 2  weeks ago.  It did not last long, possibly 15 minutes.  It came and went  quickly.  It was caused by light through the trees causing a scotoma, which  made the patient get slightly sick and then he looked very quickly to his  right, which caused his acute symptoms.  I sent him to physical therapy  earlier in the year for the same problem and they were unable to induce the  symptoms and did not treat him.  He had a blood pressure taken at Dr.  Kandis Cocking office recently and cardiology where he had not been seen for a  while and his blood pressure was very high.  Dr. Alanda Amass wants to evaluate  the renal arteries and is scheduled for that on December 10.  The patient  really has no complaints today.  Has not been hospitalized lately, although  he has been in the emergency room for dizziness.  Has had a head CT and a  MRI of the head and neck which showed degenerative disk disease.  He has  been seen by ENT for the dizziness as well; none of which has really made a  difference.  His son has recently been diagnosed with kidney disease as  well.  His creatinine is 1.9.  The patient, himself, has been very stable  with his kidney disease.  He has not seen Dr. Elton Sin for over a year now.  Last evaluation was actually in February 2005.   PAST MEDICAL HISTORY:  Significant for:  1. Extreme labile hypertension since 1984.  2. Hypothyroidism since 1983.  3. Elevated cholesterol and triglycerides since at least 1998.  4. Polycystic kidney disease with renal insufficiency of longstanding.  5. Benign  prostatic hypertrophy.  6. Hemorrhoids.  7. Coronary artery disease with most recent catheterization in March 2004      which was basically stable.  8. Elevated glucose since November 2000 which has remained in the low 100      range.   The patient had a MRI of the cervical spine in October of this year which  showed degenerative disk disease, otherwise normal.  A CT of the head with  no abnormalities in September of this year.  His catheterization, as above,  was in March 2004 predicated because of his stress Cardiolite test that  showed positive EKG changes just prior to that.  He had a MRI/MRA of the  head in May 2000 which showed mild atrophy and small vessel disease due to  presumed hypertension and an echo in March 1999 that showed a left atrial  enlargement.  He denies rheumatic fever, Scarlet Fever, strep infection,  asthma, pneumonia,  diabetes, liver disease.  He has had hypothyroidism since  1983, as above.  Has no known drug allergies.  Has never smoked.  Does not  drink or use drugs.   His medicines include:  1. Metoprolol ER 25 mg daily.  2. Diltiazem XR 180 mg daily.  3. Diovan 320 mg daily.  4. Ranitidine 150 mg once daily.  5. Synthroid 0.137 mg daily.  6. Zetia 10 mg daily.  7. Sertraline 100 mg daily.  8. Lipitor 40 mg at night.  9. Tenex 2 mg at night.  10.TriCor 160 mg at night.   He has had some erectile dysfunction in the past.  Viagra has not helped  much.   REVIEW OF SYSTEMS:  Significant for rare headaches at this point; dizziness  of which is presumably labyrinthitis, now fleeting; and syncope with  elevated blood pressure once in the past and also another episode from  bleeding from the teeth with extraction.  He has had no sequelae from either  incident.  EYES:  Significant for RK surgery in 1992 and laser surgery again  in January 2002.  He was last seen in October of this year and was tested  for glaucoma.  Hearing is mildly decreased, checked at  his work.  He has  occasional ringing of the ears.  He has a partial lower denture with his  last dental exam last month.  He has shortness of breath at times with  extreme exertion.  He has occasional chest pain which resolves with burping  and occasional palpitations.  He has had a murmur in the past, echo was  normal.  He was a blue baby when born.  He has rare diarrhea with  occasional constipation and has had blood from the rectum with straining due  to hemorrhoids in the past.  He has rare reflux disease reasonably well  controlled now with Ranitidine.  He does have nocturia usually once a day  with urgency, dribbling, and rare incontinence with decreased stream.  Otherwise, head, eyes, ears, nose, and throat, teeth, and gums,  cardiorespiratory, gastrointestinal, and genitourinary systems are  noncontributory.   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  He is  married and lives with his wife.  Has 2 children out of the house.   FAMILY HISTORY:  Father is alive at the age of 61 with hypertension.  Mother  died at the age of 72 with renal failure, was on dialysis 3 times a week  when she died.  Two sisters deceased, 1 with 3 or 4 heart attacks and an  aneurysm and kidney disease with failure.  Other died from COPD with oxygen  dependence.   Last tetanus shot was in 2003.  He had a flu shot last year in 2006.   PHYSICAL EXAMINATION:  A well-developed, well-nourished, 65 year old white  male in no acute distress.  Temperature 97.3.  Pulse is 60.  Blood pressure 150/90.  A weight of 188 at  68 inches with no known allergies.  Blood pressure when rechecked was  140/85.  Conjunctivae were clear.  HEENT:  Within normal limits.  Hearing was normal.  NECK:  Without adenopathy.  Thyroid is without  nodularity. LUNGS:  Clear to auscultation.  BACK:  Straight, nontender with no CVA tenderness.  HEART:  Regular rate and rhythm without murmur being heard today.  Pulses  were 2+ throughout and carotids were without bruits.  CHEST:  Symmetric with good excursion.  ABDOMEN:  Soft, nontender.  Good bowel sounds and no masses.  No  lymphadenopathy was noted throughout.  GENITOURINARY:  Testicles were descended bilaterally without nodularity, no  hernias or adenopathy are noted.  RECTAL:  Normal tone.  Guaiac-negative stool.  No mass.  The prostate 20 g,  smooth, soft, symmetric.  Raphae intact without nodularity.  MUSCULOSKELETAL:  Muscle strength is 5/5 with range of motion, gait, and  mobility normal.  SKIN:  Significant for actinic keratosis and benign moles.   Laboratory data was discussed.  Metabolic panel was totally normal, except  for his sugar being 110, which is stable for him, and his BUN, creatinine  being 33 and 2.9, which is the best it has been.  Cholesterol profile:  His  triglycerides were 141 and his HDL was only 34.1 and his LDL was 91.  Microalbumin was 360.4 mg per gram, which is improved for him.  His  glomerular filtration rate calculator was 24 mL per minute.  A1c was 6.5.  His PSA was 2.02 which is mildly elevated and his thyroid functions were  euthyroid on his current dose of Synthroid.  BUN and creatinine since  September 2004 were 41 and 3, respectively; 49 and 3 in December 2004; March  2005, 40 and 3;  July 2005, 35 and 3; November 2006, 33 and 2.9; June 2007,  29 and 3.1; September 2007, 32 and 3; October 2007, 40 and 3.2; and November  2007, 33 and 2.9.   ASSESSMENT:  1. Health maintenance physical examination with labile hypertension with      chronic renal insufficiency and known polycystic disease which now      appears stable on his current regimen and, currently, BUN and      creatinine are the best it has been since September 2004.  Still has       slightly high hypertension and Dr. Alanda Amass has adjusted his medicines      which the patient has not yet done, but I think we will be okay.  So, I      have written for prescriptions for him to go from Cardizem XR 180 to      Norvasc 5 mg daily, #90, and 4 refills, and Toprol XL 50 mg daily, #90      and 4 refills.  Have his renal artery evaluation on December 10 as      scheduled and followup with Dr. Alanda Amass as requested.  2. Hypothyroidism, adequate and euthyroid on the current dose of iron 37      mcg per day.  A prescription was written for #90 and 4 refills.  3. Elevated cholesterol and triglycerides which are adequate.  Still try      to decrease his LDL further by regular exercise and trying to tighten      diet somewhat which we discussed.  4. Benign prostatic hypertrophy which is stable.  He gets up once at night      sometimes but not every night, which is the best it has been in quite      some time and we will follow. 5. Coronary artery disease, appears stable.  He just saw Dr. Alanda Amass,      will followup.  6. Elevated glucose, stable since November 2000.  Cautioned the patient to  continue to be compliant with decreased sweets and carbohydrates.      Return in 3 months for an appointment. Refer back to Dr. Elton Sin to be      reevaluated at [facility].   Prescriptions otherwise were written for all of his medications for 3 months  and 3 refills and see me sooner if required.     Arta Silence, MD  Electronically Signed    RNS/MedQ  DD: 07/31/2006  DT: 08/01/2006  Job #: [id number]   cc:   Gerlene Burdock A. Alanda Amass, M.D.

## 2011-01-28 NOTE — Cardiovascular Report (Signed)
NAME:  Edward Oconnell, Edward Oconnell                           ACCOUNT NO.:  [id number]   MEDICAL RECORD NO.:  [id number]                   PATIENT TYPE:  OIB   LOCATION:  2899                                 FACILITY:  [facility]   PHYSICIAN:  Veneda Melter, M.D. [facility]               DATE OF BIRTH:  April 06, 1946   DATE OF PROCEDURE:  12/10/2002  DATE OF DISCHARGE:                              CARDIAC CATHETERIZATION   PROCEDURES PERFORMED:  1. Left heart catheterization.  2. Selective coronary angiography.  3. Selective renal angiography.   DIAGNOSES:  1. Three-vessel coronary artery disease.  2. Mild renal artery stenosis.   HISTORY:  The patient is a 65 year old gentleman with polycystic kidney  disease, hypertension, hypothyroidism and dyslipidemia who presents for  assessment of abnormal Cardiolite.  The patient had mild scarring and  ischemia in the inferior apex.  He is referred for further cardiac  assessment.   TECHNIQUE:  Informed consent was obtained.  The patient was brought to the  catheterization laboratory.  A 6-French sheath was placed in the right  femoral artery using modified Seldinger technique.  A 6-French JL-4 and JR-4  catheter were then used to engage the left and right coronary arteries.  A  selective angiography was performed in various projections using manual  injection of contrast.  A JR-4 catheter was then used to engage the renal  arteries and again selective angiography was performed using manual  injection of contrast.  A 6-French pigtail catheter was then advanced to the  left ventricle and appropriate hemodynamics were obtained.  Left  ventriculogram was not performed due to mild renal insuffiencey.  At the  termination of the case, the catheters were removed, the patient was  enrolled in the matrix closure study and was randomized to standard  compression.  Manual pressure was applied and hemostasis was achieved.  The  patient was then transferred to the floor in  stable condition.  He tolerated  the procedure well.  A total of 45 cc of nonionic contrast was utilized.   FINDINGS:   CORONARY ANGIOGRAPHY:  1. Left main trunk, a large-caliber vessel is a proximal taper of 30%.  2. LAD.  This is a small-caliber vessel, provides a first diagonal branch in     the mid-section.  The LAD has severe narrowing of 70% of the proximal     segment.  Prior to the first septal perforator there is abnormal contour     suggesting an ulcerated plaque.  There is then a further high-grade     narrowing of 70% between the septal perforator and the first diagonal     branch.  There is moderate stenosis involving  the bifurcation with the     diagonal branch of 50%.  The distal LAD is a small-caliber vessel with     mild irregularities.  The first diagonal branch has an ostial narrowing  of 50% as noted.  Remainder of vessel has mild disease.  3. Left circumflex artery is dominant and is a medium caliber vessel prior     to a distal marginal branch as well as the posterior descending artery.     The ostium and proximal AV circumflex has a tubular narrowing of at least     50 to 60%.  There is then further disease of 30% in the mid AV     circumflex.  The PDA and distal marginal branch have mild irregularities.  4. Ramus intermedius.  This is a large-caliber vessel prior to the     anterolateral wall.  There is a long tubular narrowing of 70% in the     proximal segment.  5. Right coronary artery.  Non-dominant and a small-caliber vessel and gives     off an  RV marginal branch.   LEFT VENTRICULOGRAPHY:  1. Left ventriculogram was not performed.  2. LV pressure is 140/5, aortic is 140/85, LVEDP equals 10.   RENAL ARTERIOGRAPHY:  1. Left renal artery is a small-caliber vessel.  There is a mild tubular     narrowing of 30% in the proximal segment.  2. The right renal artery is a large-caliber vessel with luminal     irregularities.   ASSESSMENT AND PLAN:  The  patient is a 65 year old gentleman who presents  with advanced multivessel coronary artery disease.  Treatment options will  be discussed with the patient.                                               Veneda Melter, M.D. [facility]    NG/MEDQ  D:  12/10/2002  T:  12/10/2002  Job:  [id number]   cc:   Veneda Melter, M.D. [facility]  [address]  [place]  [place] [address]  Fax: [phone]   Laurita Quint, M.D.  [address] [place]  [place] [address]  Fax: [phone]

## 2011-01-28 NOTE — Discharge Summary (Signed)
NAME:  Edward Oconnell, Edward Oconnell                           ACCOUNT NO.:  [id number]   MEDICAL RECORD NO.:  [id number]                   PATIENT TYPE:  OIB   LOCATION:  3733                                 FACILITY:  [facility]   PHYSICIAN:  Richard A. Alanda Amass, M.D.          DATE OF BIRTH:  18-Mar-1946   DATE OF ADMISSION:  12/10/2002  DATE OF DISCHARGE:  12/10/2002                                 DISCHARGE SUMMARY   HISTORY OF PRESENT ILLNESS:  The patient came in for outpatient Cardiolite.  He apparently had been seen by Dr. Hetty Ely, who is his primary care  physician.  Screening Cardiolite test was done per patient's request for  past history of known coronary disease, seen by Dr. Corinda Gubler in the past.  This was done in the [facility] office.  A Cardiolite, showed some  minor abnormality.  He had no other abnormal stress tests with good exercise  tolerance, a borderline abnormal stress EKG, normal LV size and function.  On images, he had partial reversible defect at the apex that could represent  a small segment of myocardial ischemia.  Thus, he was recommended to undergo  cardiac catheterization.  This was performed by Dr. Chales Abrahams which revealed  disease in his LAD, 70 and 50% blockages in his left circumflex, 50% and 30%  blockages in his OD,  70% blockages. Left ventriculogram was not done  secondary to known renal insufficiency with a baseline creatinine.  He also  had left main proximal 30%.  His RCA was small and nondominant.  The  patient, at that point, asked to be seen by Dr. Alanda Amass, his primary  cardiologist.  At that point, we were requested to take care of the patient,  thus he was seen by Dr. Alanda Amass.  He felt that he did not have any  symptoms of angina.  He has known polycystic kidney disease long term with  abnormal creatinine.  He felt that his Cardiolite only had a minor  abnormality, thus, he felt the patient could go home.  Dr. Alanda Amass plans  on reviewing the  films and seeing the patient in the office and to discuss  treatment options with him at that time.   LABORATORY DATA:  On December 03, 2002, his hemoglobin was 14.8, hematocrit  42.7, wbc 6.9, platelets 260.  Sodium 140, potassium 4.1, BUN 24, creatinine  2.0.  He was treated with Mucomyst prior to and after his procedure.   DISCHARGE DIAGNOSES:  1. Atherosclerotic cardiovascular disease status post catheterization as     stated above.  Mildly abnormal Cardiolite.  2. Polycystic kidney disease with abnormal creatinine, chronic; had been see     by Dr. Cornelius Moras in the past.  Apparently Dr. Hetty Ely is his primary care     physician and also sees him for his polycystic kidney disease.  3. History of hyperlipidemia.  4. Hypertension, difficult to control in the past.  The patient states that     his current medications keep his blood pressure under well control.   DISCHARGE MEDICATIONS:  1. Diltiazem XR 240 mg once a day.  2. Hyzaar 100/25 every morning.  3. Synthroid 125 mcg once a day.  4. ________ 150 mg twice a day.  5. Zoloft 100 mg at bedtime.  6. Lipitor 10 mg every morning.  7. Cozaar 50 mg once a day.  8. Toprol XL 25 mg once a day.  9. Enteric coated aspirin 81 mg once a day.   ACTIVITY:  He should do no strenuous activity, no driving, no lifting for  three days.   DISCHARGE INSTRUCTIONS:  He should have a BMET drawn on December 12, 2002.   FOLLOW UP:  He will follow up with Dr. Alanda Amass in two weeks.  Our office  will call him with an appointment.     Lezlie Octave, N.P.                        Richard A. Alanda Amass, M.D.    BB/MEDQ  D:  12/10/2002  T:  12/11/2002  Job:  [id number]   cc:   Laurita Quint, M.D.  [address] [place]  [place] [address]  Fax: [phone]

## 2011-01-28 NOTE — H&P (Signed)
NAME:  Edward Oconnell, Edward Oconnell                           ACCOUNT NO.:  [id number]   MEDICAL RECORD NO.:  [id number]                   PATIENT TYPE:  OIB   LOCATION:  3733                                 FACILITY:  [facility]   PHYSICIAN:  Richard A. Alanda Amass, M.D.          DATE OF BIRTH:  [birth year]   DATE OF ADMISSION:  DATE OF DISCHARGE:                                HISTORY & PHYSICAL   HISTORY OF PRESENT ILLNESS:  The patient is a 65 year old male followed by  Dr. Lovey Newcomer who has seen Dr. Alanda Amass in the past with a history of  hypertension and polycystic kidney disease and chronic renal insufficiency.  He last saw Dr. Alanda Amass in 1998.  He has had a negative Cardiolite study,  this was in 11/98.  He recently saw Dr. Lovey Newcomer for health maintenance  physical.  He was set up for a Cardiolite study as a screening.  He was not  having chest pain.  The Cardiolite study was abnormal at [facility]  12/03/02 with evidence of partial reversibility in the apex.  He was admitted  for diagnostic catheterization.  The patient was admitted.  Dr. Jillyn Hidden  evaluated and [facility] cardiology by Dr. Daleen Squibb.  He received Mucomyst prior to  his catheterization.  The catheterization was done using dye for the cords  only, revealed 70% LAD, 70% ramus intermedius, and 50-60% circumflex.  After  the catheterization the patient requested Dr. Alanda Amass be called.  We are  seeing him now and taking him over on our service.  He tolerated the  catheterization well and is stable.   CURRENT MEDICATIONS:  1. Diltiazem 240 daily.  2. Hyzaar 10/25 daily.  3. Synthroid 0.125 mg a day.  4. Ranitidine 150 mg b.i.d.  5. Cozaar 50 mg a day.  6. Zoloft 100 mg h.s.  7. Lipitor 10 mg h.s.  8. Humibid LA b.i.d.   ALLERGIES:  No known drug allergies.   PAST SURGICAL HISTORY:  He has had previous laser surgery.   PAST MEDICAL HISTORY:  1. He has a history of polycystic kidney disease and was seen by renal     service in  the past.  He says his creatinine has been stable at 2 for     some time now.  2. Hyperlipidemia.  3. Hypothyroidism.   SOCIAL HISTORY:  He is married.  Retired in [employer].  He  has two children.  He does not smoke.   FAMILY HISTORY:  His father is alive at 60 and he also has a history of  hypertension.  His mother died in her 44s of renal failure.  He has one  sister who died of a heart attack and one sister died of COPD.   REVIEW OF SYSTEMS:  Essentially unremarkable except as noted above.   PHYSICAL EXAMINATION:  VITAL SIGNS: Blood pressure 126/56, heart rate 82,  temperature 97.3.  GENERAL: He is  a well-developed well-nourished man in no acute distress.  HEENT: Normocephalic.  Extraocular movements intact.  Sclerae are  nonicteric.  Conjunctivae are within normal limits.  NECK: Without bruit and without JVD.  CHEST: Clear to auscultation and percussion.  CARDIAC: Reveals regular rate and rhythm without murmurs, rubs, or gallops.  Normal S1 and S2.  ABDOMEN: Nontender.  No hepatosplenomegaly.  Bowel sounds are present.  EXTREMITIES: Without edema.  Peripheral pulses are 2+/4.  His right groin is  without hematoma.  NEUROLOGIC: Grossly intact.  He is awake, alert, oriented, and cooperative.  He moves all extremities without obvious deficit.   LABORATORY DATA:  EKG reveal sinus rhythm without acute changes.   IMPRESSION:  1. Abnormal Cardiolite study with moderate coronary disease at     catheterization.  2. Chronic renal insufficiency with a history of polycystic kidney disease     with a stable creatinine of about 2.1.  3. Hypertension, controlled.  4. Hypothyroidism.  5. History of benign prostatic hypertrophy.  6. Family history of coronary disease.   PLAN:  The patient sinographies will be reviewed by Dr. Alanda Amass.  We will  follow up a creatinine.  We have assumed his care.      Abelino Derrick, P.A.                      Richard A. Alanda Amass,  M.D.    Lenard Lance  D:  12/10/2002  T:  12/10/2002  Job:  [id number]   cc:   Jillyn Hidden B. Truett Perna, M.D.  [address] [facility]  [place]  [place]  [address]  Fax: [phone]

## 2011-02-11 ENCOUNTER — Ambulatory Visit: Payer: Medicare Other | Admitting: Oncology

## 2011-03-07 ENCOUNTER — Other Ambulatory Visit: Payer: Self-pay | Admitting: Family Medicine

## 2011-03-07 NOTE — Telephone Encounter (Signed)
Please verify dose with pt.  It appears he was on 137, no a day.  Let me know. Thanks.

## 2011-03-07 NOTE — Telephone Encounter (Signed)
Patient not seen in over one year for physical is it okay to fill?

## 2011-03-09 NOTE — Telephone Encounter (Signed)
Please schedule pt for CPE. 

## 2011-03-09 NOTE — Telephone Encounter (Signed)
Spoke with spouse and was advised that patient is taking of Synthroid.  Please advise.

## 2011-03-10 MED ORDER — LEVOTHYROXINE SODIUM 150 MCG PO TABS: 150.0000 ug | ORAL_TABLET | Freq: Every day | ORAL | Status: DC

## 2011-03-10 NOTE — Telephone Encounter (Signed)
Patient say that he is going on vacation next week and will call to schedule cpx when he gets back. Also asked for 90 day supply. Refilled #90 w/ 0 refills and canceled original script w/ pharmacy.

## 2011-03-25 ENCOUNTER — Other Ambulatory Visit: Payer: Self-pay | Admitting: *Deleted

## 2011-03-25 MED ORDER — SERTRALINE HCL 100 MG PO TABS: 100.0000 mg | ORAL_TABLET | Freq: Every day | ORAL | Status: DC

## 2011-04-27 ENCOUNTER — Ambulatory Visit: Payer: Medicare Other | Admitting: Oncology

## 2011-05-14 ENCOUNTER — Ambulatory Visit: Payer: Medicare Other | Admitting: Oncology

## 2011-06-12 ENCOUNTER — Other Ambulatory Visit: Payer: Self-pay | Admitting: Family Medicine

## 2011-06-13 ENCOUNTER — Other Ambulatory Visit: Payer: Self-pay | Admitting: *Deleted

## 2011-06-13 MED ORDER — LEVOTHYROXINE SODIUM 150 MCG PO TABS: 150.0000 ug | ORAL_TABLET | Freq: Every day | ORAL | Status: DC

## 2011-06-13 NOTE — Telephone Encounter (Signed)
Thyroid functions being followed with everything else through [facility].

## 2011-06-13 NOTE — Telephone Encounter (Signed)
Received faxed refill request from pharmacy. Is it okay to refill Synthroid?

## 2011-07-13 ENCOUNTER — Encounter: Payer: Self-pay | Admitting: Family Medicine

## 2011-07-13 ENCOUNTER — Ambulatory Visit (INDEPENDENT_AMBULATORY_CARE_PROVIDER_SITE_OTHER): Payer: Medicare Other | Admitting: Family Medicine

## 2011-07-13 DIAGNOSIS — Q613 Polycystic kidney, unspecified: Secondary | ICD-10-CM

## 2011-07-13 DIAGNOSIS — E039 Hypothyroidism, unspecified: Secondary | ICD-10-CM

## 2011-07-13 DIAGNOSIS — E782 Mixed hyperlipidemia: Secondary | ICD-10-CM

## 2011-07-13 DIAGNOSIS — I10 Essential (primary) hypertension: Secondary | ICD-10-CM

## 2011-07-13 DIAGNOSIS — E119 Type 2 diabetes mellitus without complications: Secondary | ICD-10-CM

## 2011-07-13 MED ORDER — LEVOTHYROXINE SODIUM 150 MCG PO TABS: 150.0000 ug | ORAL_TABLET | Freq: Every day | ORAL | Status: DC

## 2011-07-13 NOTE — Assessment & Plan Note (Signed)
S/P transplant that eventually did not take but kidney function hanging in there. He thinks the renal problem began with manipulation of BP meds. We discussed avoiding dehydration and not overdoing. Cont per [facility].

## 2011-07-13 NOTE — Patient Instructions (Addendum)
Discuss Zostavax with Nephrology folks at [facility]. I do not think a live vaccine a good idea with his current state of disease.

## 2011-07-13 NOTE — Assessment & Plan Note (Signed)
Very slightly high, meds controlled via [facility]. Not high enough here today to warrant readjustment. Will follow. BP Readings from Last 3 Encounters:  07/13/11 148/84  07/08/10 130/82  07/01/09 112/80

## 2011-07-13 NOTE — Progress Notes (Signed)
  Subjective:    Patient ID: Edward Oconnell, male    DOB: Jul 03, 1946, 65 y.o.   MRN: [id number]  HPI Pt here for Comp Exam. He has had kidney transplant with rejection and is again on the transplant list. He is followed at [facility] and seen routinely. He continues with his [employer], doing physically what he can but staying in there as he does not know how not to work and would go nuts without something to do. He looks remarkably well today and really has no complaints. "I am good as long as I can stay off that machine."    Review of Systems  Constitutional: Positive for fatigue (routinely). Negative for fever, chills, diaphoresis, appetite change and unexpected weight change.  HENT: Negative for hearing loss, ear pain, tinnitus and ear discharge.   Eyes: Positive for visual disturbance (getting weaker as time goes by.). Negative for pain and discharge.  Respiratory: Negative for cough, shortness of breath and wheezing.   Cardiovascular: Negative for chest pain and palpitations.       No SOB w/ exertion  Gastrointestinal: Negative for nausea, vomiting, abdominal pain, diarrhea, constipation and blood in stool.       No heartburn or swallowing problems.  Genitourinary: Negative for dysuria, frequency and difficulty urinating.       No nocturia  Musculoskeletal: Negative for myalgias, back pain and arthralgias.  Skin: Negative for rash.       No itching or dryness.  Neurological: Negative for tremors and numbness.       No tingling or balance problems.  Hematological: Negative for adenopathy. Does not bruise/bleed easily.  Psychiatric/Behavioral: Negative for dysphoric mood and agitation.       Objective:   Physical Exam  Constitutional: He is oriented to person, place, and time. He appears well-developed and well-nourished. No distress.  HENT:  Head: Normocephalic and atraumatic.  Right Ear: External ear normal.  Left Ear: External ear normal.  Nose: Nose normal.  Mouth/Throat:  Oropharynx is clear and moist.  Eyes: Conjunctivae and EOM are normal. Pupils are equal, round, and reactive to light. Right eye exhibits no discharge. Left eye exhibits no discharge. No scleral icterus.  Neck: Normal range of motion. Neck supple. No thyromegaly present.  Cardiovascular: Normal rate, regular rhythm, normal heart sounds and intact distal pulses.   No murmur heard. Pulmonary/Chest: Effort normal and breath sounds normal. No respiratory distress. He has no wheezes.  Abdominal: Soft. Bowel sounds are normal. He exhibits no distension and no mass. There is no tenderness. There is no rebound and no guarding.       Multiple lower abdominal incision scars.  Genitourinary:       No genitorectal exam.  Musculoskeletal: Normal range of motion. He exhibits no edema.  Lymphadenopathy:    He has no cervical adenopathy.  Neurological: He is alert and oriented to person, place, and time. Coordination normal.  Skin: Skin is warm and dry. No rash noted. He is not diaphoretic.  Psychiatric: He has a normal mood and affect. His behavior is normal. Judgment and thought content normal.          Assessment & Plan:

## 2011-07-13 NOTE — Assessment & Plan Note (Signed)
Also followed closely per [facility]. [place]far has done well with good A1Cs.

## 2011-07-13 NOTE — Assessment & Plan Note (Signed)
Was adjusteed recently via labs at [facility] by [facility] but he needs script written here, which has been done.

## 2011-07-13 NOTE — Assessment & Plan Note (Signed)
Again followed by [facility]. LAbs I have received have been good.

## 2011-09-15 ENCOUNTER — Other Ambulatory Visit: Payer: Self-pay | Admitting: Family Medicine

## 2011-09-16 MED ORDER — LEVOTHYROXINE SODIUM 150 MCG PO TABS: 150.0000 ug | ORAL_TABLET | Freq: Every day | ORAL | Status: DC

## 2011-09-16 NOTE — Telephone Encounter (Signed)
Addended by: Annamarie Major on: 09/16/2011 02:37 PM   Modules accepted: Orders

## 2011-09-18 ENCOUNTER — Other Ambulatory Visit: Payer: Self-pay | Admitting: Family Medicine

## 2011-09-21 DIAGNOSIS — Z85828 Personal history of other malignant neoplasm of skin: Secondary | ICD-10-CM | POA: Diagnosis not present

## 2011-09-21 DIAGNOSIS — L821 Other seborrheic keratosis: Secondary | ICD-10-CM | POA: Diagnosis not present

## 2011-09-28 DIAGNOSIS — N289 Disorder of kidney and ureter, unspecified: Secondary | ICD-10-CM | POA: Diagnosis not present

## 2011-09-29 DIAGNOSIS — N289 Disorder of kidney and ureter, unspecified: Secondary | ICD-10-CM | POA: Diagnosis not present

## 2011-10-04 DIAGNOSIS — N289 Disorder of kidney and ureter, unspecified: Secondary | ICD-10-CM | POA: Diagnosis not present

## 2011-10-04 DIAGNOSIS — Z79899 Other long term (current) drug therapy: Secondary | ICD-10-CM | POA: Diagnosis not present

## 2011-10-18 ENCOUNTER — Ambulatory Visit (INDEPENDENT_AMBULATORY_CARE_PROVIDER_SITE_OTHER): Payer: Medicare Other | Admitting: Family Medicine

## 2011-10-18 ENCOUNTER — Encounter: Payer: Self-pay | Admitting: Family Medicine

## 2011-10-18 DIAGNOSIS — R05 Cough: Secondary | ICD-10-CM

## 2011-10-18 NOTE — Patient Instructions (Signed)
Finish the avelox.  This should gradually improve.  Take care.  Let us know if you have other concerns.

## 2011-10-18 NOTE — Progress Notes (Signed)
Followed at [facility].  On immunosuppressives for renal transplant.    Started with post nasal gtt, ST, some cough.  No fevers.  Going on for about 1 week.  On avelox per renal at [facility].  Some better, "I feel all right" but the cough is still lingering.  Has 2 days left of avelox.  Not SOB unless with exertion, at baseline.  No CP.  No dysuria.  No abd pain, but some diarrhea recently.  Sugar has been controlled.   Meds, vitals, and allergies reviewed.   ROS: See HPI.  Otherwise, noncontributory.  nad Mmm Op wnl L TM with SOM, no erythema on either TM Minimal cobblestoning.  ctab rrr Abd soft No edema

## 2011-10-20 DIAGNOSIS — R05 Cough: Secondary | ICD-10-CM | POA: Insufficient documentation

## 2011-10-20 NOTE — Assessment & Plan Note (Signed)
Nontoxic, lungs ctab, I would finish the abx and then f/u prn. This should resolve.  He agrees, understood.

## 2011-10-31 DIAGNOSIS — Z0000 Encounter for general adult medical examination without abnormal findings: Secondary | ICD-10-CM | POA: Diagnosis not present

## 2011-11-14 DIAGNOSIS — Z79899 Other long term (current) drug therapy: Secondary | ICD-10-CM | POA: Diagnosis not present

## 2011-11-14 DIAGNOSIS — N185 Chronic kidney disease, stage 5: Secondary | ICD-10-CM | POA: Diagnosis not present

## 2011-11-14 DIAGNOSIS — E039 Hypothyroidism, unspecified: Secondary | ICD-10-CM | POA: Diagnosis not present

## 2011-11-14 DIAGNOSIS — I120 Hypertensive chronic kidney disease with stage 5 chronic kidney disease or end stage renal disease: Secondary | ICD-10-CM | POA: Diagnosis not present

## 2011-11-14 DIAGNOSIS — Q612 Polycystic kidney, adult type: Secondary | ICD-10-CM | POA: Diagnosis not present

## 2011-11-14 DIAGNOSIS — T8610 Unspecified complication of kidney transplant: Secondary | ICD-10-CM | POA: Diagnosis not present

## 2011-11-14 DIAGNOSIS — I10 Essential (primary) hypertension: Secondary | ICD-10-CM | POA: Diagnosis not present

## 2012-01-09 DIAGNOSIS — Z79899 Other long term (current) drug therapy: Secondary | ICD-10-CM | POA: Diagnosis not present

## 2012-01-09 DIAGNOSIS — T451X5A Adverse effect of antineoplastic and immunosuppressive drugs, initial encounter: Secondary | ICD-10-CM | POA: Diagnosis not present

## 2012-01-09 DIAGNOSIS — E785 Hyperlipidemia, unspecified: Secondary | ICD-10-CM | POA: Diagnosis not present

## 2012-01-09 DIAGNOSIS — N189 Chronic kidney disease, unspecified: Secondary | ICD-10-CM | POA: Diagnosis not present

## 2012-01-28 ENCOUNTER — Other Ambulatory Visit: Payer: Self-pay | Admitting: Family Medicine

## 2012-01-30 ENCOUNTER — Other Ambulatory Visit: Payer: Self-pay | Admitting: *Deleted

## 2012-01-30 MED ORDER — ATORVASTATIN CALCIUM 40 MG PO TABS: ORAL_TABLET | ORAL | Status: DC

## 2012-03-19 ENCOUNTER — Encounter: Payer: Self-pay | Admitting: Internal Medicine

## 2012-03-19 ENCOUNTER — Telehealth: Payer: Self-pay | Admitting: Family Medicine

## 2012-03-19 ENCOUNTER — Ambulatory Visit (INDEPENDENT_AMBULATORY_CARE_PROVIDER_SITE_OTHER): Payer: Medicare Other | Admitting: Internal Medicine

## 2012-03-19 VITALS — BP 160/94 | HR 68 | Temp 98.2°F | Ht 67.75 in | Wt 185.2 lb

## 2012-03-19 DIAGNOSIS — N433 Hydrocele, unspecified: Secondary | ICD-10-CM | POA: Insufficient documentation

## 2012-03-19 NOTE — Telephone Encounter (Signed)
Caller: Blease/Patient; PCP: Crawford Givens Clelia Croft); CB#: [phone];  Call regarding Swollen/Painful R. Scrotum; Onset: approx 02/17/12.   Afebrile.  Feels scalded.  Advised to see MD now for new marked swelling per Scrotum or Testicles Symptoms.  Appt scheduled for 1630 03/19/12 with Dr Alphonsus Sias.

## 2012-03-19 NOTE — Assessment & Plan Note (Signed)
Reassured that this is not worrisome Discussed proper support and padding of seat when on riding mower---traumatized area No treatment indicated

## 2012-03-19 NOTE — Progress Notes (Signed)
Subjective:    Patient ID: Edward Oconnell, male    DOB: 1946/04/23, 66 y.o.   MRN: [id number]  HPI Just at beach this weekend Seemed to get worse  Has swelling of right hemiscrotum Right side seems to draw up Worse since trip to beach  Some dull aching in right scrotum Not overly red Not warm No fever  No trouble with voiding--just goes frequently after diuretic No dysuria or hematuria Bowels have been okay. Gets sore near rectum. Some loose stools  Current Outpatient Prescriptions on File Prior to Visit  Medication Sig Dispense Refill  . aspirin 81 MG EC tablet Take 81 mg by mouth daily.        Marland Kitchen atorvastatin (LIPITOR) 40 MG tablet Take one by mouth Monday, Wednesday and Friday  90 tablet  1  . fenofibrate (TRICOR) 48 MG tablet Take 48 mg by mouth daily.        . Ferrous Gluconate (IRON) 240 (27 FE) MG TABS Take by mouth daily.        . furosemide (LASIX) 40 MG tablet Take 1 tablet by mouth Mon, Wed and Fri      . hydrALAZINE (APRESOLINE) 50 MG tablet Take 50 mg by mouth 4 (four) times daily.       Marland Kitchen levothyroxine (SYNTHROID) 150 MCG tablet Take 1 tablet (150 mcg total) by mouth daily.  90 tablet  3  . losartan (COZAAR) 25 MG tablet Take 25 mg by mouth 2 (two) times daily.        . Magnesium Chloride (SLOW-MAG PO) Take by mouth 2 (two) times daily.        . metoprolol tartrate (LOPRESSOR) 25 MG tablet Take 25 mg by mouth 2 (two) times daily.      . mycophenolate (MYFORTIC) 180 MG EC tablet Taking 2 tabs in Am and 1 tab in PM      . ranitidine (ZANTAC) 150 MG tablet Take 150 mg by mouth daily.      . sodium bicarbonate 650 MG tablet Take 650 mg by mouth 2 (two) times daily.        . tacrolimus (PROGRAF) 1 MG capsule Take 2 & 1/2 by mouth in AM and 3 tabs at bedtime.      Marland Kitchen DISCONTD: sertraline (ZOLOFT) 100 MG tablet TAKE 1 TABLET EVERY DAY  90 tablet  2    No Known Allergies  Past Medical History  Diagnosis Date  . CAD (coronary artery disease)     nonobstructive  .  Hypertension   . Hypothyroidism   . Benign prostatic hypertrophy   . Chronic kidney disease     s/p transplant per [facility]  . Hyperlipidemia   . Stroke     Past Surgical History  Procedure Date  . Kidney transplant     donor    Family History  Problem Relation Age of Onset  . Kidney failure Mother     was on dialysis 3 times a week when she died  . Alcohol abuse Mother   . Hypertension Father   . Heart attack Sister   . Kidney disease Sister   . Aneurysm Sister   . COPD Sister     History   Social History  . Marital Status: Married    Spouse Name: N/A    Number of Children: N/A  . Years of Education: N/A   Occupational History  . [employer] as a Nurse, adult     retired now totally  . part  owner in a [employer]    Social History Main Topics  . Smoking status: Never Smoker   . Smokeless tobacco: Never Used  . Alcohol Use: No  . Drug Use: No  . Sexually Active: Yes   Other Topics Concern  . Not on file   Social History Narrative  . No narrative on file   Review of Systems Kidney keeping him off dialysis but still not working well Frequently has nausea    Objective:   Physical Exam  Constitutional: He appears well-developed and well-nourished. No distress.  Genitourinary:       Swelling in right hemiscrotum Transilluminates No sig tenderness or warmth Slight redness over overlying skin          Assessment & Plan:

## 2012-03-19 NOTE — Telephone Encounter (Signed)
Noted  

## 2012-04-30 ENCOUNTER — Telehealth: Payer: Self-pay | Admitting: Family Medicine

## 2012-04-30 NOTE — Telephone Encounter (Signed)
He is going to be seeing his kidney doctor soon and needs blood work done.  He has orders from that doctor. Where he used to go is closed and he is wondering if he could have it done in our lab.  I told him I would leave message for the office to call him in morning and let him know.  He said he can't wait that long to take his Prograf so he plans to be here at 0800 in the morning.

## 2012-05-01 ENCOUNTER — Telehealth: Payer: Self-pay

## 2012-05-01 DIAGNOSIS — N189 Chronic kidney disease, unspecified: Secondary | ICD-10-CM | POA: Diagnosis not present

## 2012-05-01 NOTE — Telephone Encounter (Signed)
Pt seeing [facility] Dr this Thursday and request lab test done at [facility]. Pt has numerous lab test requested by Dr at [facility]; pt has signed orders from [facility] Dr. Algis Downs pt needs to go to [facility] drawing station (nearest one is off [address]). Pt verbalized understanding.

## 2012-05-03 DIAGNOSIS — T8610 Unspecified complication of kidney transplant: Secondary | ICD-10-CM | POA: Diagnosis not present

## 2012-05-03 DIAGNOSIS — I120 Hypertensive chronic kidney disease with stage 5 chronic kidney disease or end stage renal disease: Secondary | ICD-10-CM | POA: Diagnosis not present

## 2012-05-03 DIAGNOSIS — E785 Hyperlipidemia, unspecified: Secondary | ICD-10-CM | POA: Diagnosis not present

## 2012-05-03 DIAGNOSIS — E039 Hypothyroidism, unspecified: Secondary | ICD-10-CM | POA: Diagnosis not present

## 2012-05-03 DIAGNOSIS — Z79899 Other long term (current) drug therapy: Secondary | ICD-10-CM | POA: Diagnosis not present

## 2012-05-03 DIAGNOSIS — Q612 Polycystic kidney, adult type: Secondary | ICD-10-CM | POA: Diagnosis not present

## 2012-05-03 DIAGNOSIS — N185 Chronic kidney disease, stage 5: Secondary | ICD-10-CM | POA: Diagnosis not present

## 2012-05-03 DIAGNOSIS — Z940 Kidney transplant status: Secondary | ICD-10-CM | POA: Diagnosis not present

## 2012-05-25 DIAGNOSIS — N508 Other specified disorders of male genital organs: Secondary | ICD-10-CM | POA: Diagnosis not present

## 2012-05-25 DIAGNOSIS — N433 Hydrocele, unspecified: Secondary | ICD-10-CM | POA: Diagnosis not present

## 2012-05-29 ENCOUNTER — Telehealth: Payer: Self-pay

## 2012-05-29 NOTE — Telephone Encounter (Signed)
Will see tomorrow

## 2012-05-29 NOTE — Telephone Encounter (Signed)
Pt walked in with dull h/a since last night; when touches rt side of head or forehead pain is sharp to touch; pt had hx of shingles and felt similar; no breakout at this time. No dizziness, SOB or vision problem. Pt seen at [facility] last week and BP was OK. Pt scheduled 05/30/12 at 8:15 with Dr Sharen Hones. If condition changes or worsens pt will go to [facility] tonight.

## 2012-05-30 ENCOUNTER — Ambulatory Visit (INDEPENDENT_AMBULATORY_CARE_PROVIDER_SITE_OTHER): Payer: Medicare Other | Admitting: Family Medicine

## 2012-05-30 ENCOUNTER — Encounter: Payer: Self-pay | Admitting: Family Medicine

## 2012-05-30 VITALS — BP 144/94 | HR 72 | Temp 97.8°F | Wt 186.5 lb

## 2012-05-30 DIAGNOSIS — B029 Zoster without complications: Secondary | ICD-10-CM | POA: Insufficient documentation

## 2012-05-30 DIAGNOSIS — R51 Headache: Secondary | ICD-10-CM

## 2012-05-30 MED ORDER — VALACYCLOVIR HCL 1 G PO TABS: 1000.0000 mg | ORAL_TABLET | Freq: Three times a day (TID) | ORAL | Status: DC

## 2012-05-30 NOTE — Assessment & Plan Note (Signed)
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 No current eye involvement. Start valtrex 1gm tid right away for 7 days. Discussed red flags to seek urgent care or to notify us for urgent referral to optho if rash progresses.

## 2012-05-30 NOTE — Patient Instructions (Addendum)
I am worried that this may be shingles of eye which can be sight threatening.  Start valtrex three times daily for 7 days. If any vision change, or any worsening rash, please let us know to set you up with eye doctor right away. May use ibuprofen for discomfort.  Shingles Shingles is caused by the same virus that causes chickenpox (varicella zoster virus or VZV). Shingles often occurs many years or decades after having chickenpox. That is why it is more common in adults older than 50 years. The virus reactivates and breaks out as an infection in a nerve root. SYMPTOMS   The initial feeling (sensations) may be pain. This pain is usually described as:   Burning.   Stabbing.   Throbbing.   Tingling in the nerve root.   A red rash will follow in a couple days. The rash may occur in any area of the body and is usually on one side (unilateral) of the body in a band or belt-like pattern. The rash usually starts out as very small blisters (vesicles). They will dry up after 7 to 10 days. This is not usually a significant problem except for the pain it causes.   Long-lasting (chronic) pain is more likely in an elderly person. It can last months to years. This condition is called postherpetic neuralgia.  Shingles can be an extremely severe infection in someone with AIDS, a weakened immune system, or with forms of leukemia. It can also be severe if you are taking transplant medicines or other medicines that weaken the immune system. TREATMENT  Your caregiver will often treat you with:  Antiviral drugs.   Anti-inflammatory drugs.   Pain medicines.  Bed rest is very important in preventing the pain associated with herpes zoster (postherpetic neuralgia). Application of heat in the form of a hot water bottle or electric heating pad or gentle pressure with the hand is recommended to help with the pain or discomfort. PREVENTION  A varicella zoster vaccine is available to help protect against the virus.  The Food and Drug Administration approved the varicella zoster vaccine for individuals 46 years of age and older. HOME CARE INSTRUCTIONS   Cool compresses to the area of rash may be helpful.   Only take over-the-counter or prescription medicines for pain, discomfort, or fever as directed by your caregiver.   Avoid contact with:   Babies.   Pregnant women.   Children with eczema.   Elderly people with transplants.   People with chronic illnesses, such as leukemia and AIDS.   If the area involved is on your face, you may receive a referral for follow-up to a specialist. It is very important to keep all follow-up appointments. This will help avoid eye complications, chronic pain, or disability.  SEEK IMMEDIATE MEDICAL CARE IF:   You develop any pain (headache) in the area of the face or eye. This must be followed carefully by your caregiver or ophthalmologist. An infection in part of your eye (cornea) can be very serious. It could lead to blindness.   You do not have pain relief from prescribed medicines.   Your redness or swelling spreads.   The area involved becomes very swollen and painful.   You have a fever.   You notice any red or painful lines extending away from the affected area toward your heart (lymphangitis).   Your condition is worsening or has changed.  Document Released: 08/29/2005 Document Revised: 08/18/2011 Document Reviewed: 08/03/2009 [facility] Patient Information 2012 [place], [place].

## 2012-05-30 NOTE — Progress Notes (Signed)
  Subjective:    Patient ID: Edward Oconnell, male    DOB: [birth year], 66 y.o.   MRN: [id number]  HPI CC: headache  BP elevated today but hasn't taken AM meds yet.  H/o renal transplant, sees [facility].  1d h/o dull ache and stinging sensation from top of head down to around eyeball.  Only right sided.  Feels like "beesting".  Stinging and burning when touching head.  If doesn't touch, feels dull ache.  Has had shingles in past - on left eyebrow.  Several years ago.  This feels exactly the same.    No rashes anywhere that he's noticed.  No fevers/chills.  Today noticed R eye has dull ache as well.  No vision changes.  No abd pain, n/v.  Has not had shingles shot.  Review of Systems Per HPI    Objective:   Physical Exam  Nursing note and vitals reviewed. Constitutional: He appears well-developed and well-nourished. No distress.  HENT:  Head: Normocephalic and atraumatic.  Right Ear: Hearing, tympanic membrane, external ear and ear canal normal.  Left Ear: Hearing, tympanic membrane, external ear and ear canal normal.  Nose: Nose normal. No mucosal edema or rhinorrhea.  Mouth/Throat: Uvula is midline, oropharynx is clear and moist and mucous membranes are normal. No oropharyngeal exudate, posterior oropharyngeal edema, posterior oropharyngeal erythema or tonsillar abscesses.       Sensitive to touch across right forehead and right scalp  Eyes: Conjunctivae normal and EOM are normal. Pupils are equal, round, and reactive to light. No scleral icterus. Right eye exhibits normal extraocular motion. Left eye exhibits normal extraocular motion.  Neck: Normal range of motion. Neck supple.  Lymphadenopathy:    He has no cervical adenopathy.  Neurological: No cranial nerve deficit or sensory deficit.       CN 2-12 intact.       Assessment & Plan:

## 2012-06-01 DIAGNOSIS — B0232 Zoster iridocyclitis: Secondary | ICD-10-CM | POA: Diagnosis not present

## 2012-06-04 ENCOUNTER — Telehealth: Payer: Self-pay | Admitting: Family Medicine

## 2012-06-04 MED ORDER — HYDROCODONE-ACETAMINOPHEN 5-500 MG PO TABS: 1.0000 | ORAL_TABLET | Freq: Four times a day (QID) | ORAL | Status: DC | PRN

## 2012-06-04 NOTE — Telephone Encounter (Signed)
Caller: Edward Oconnell/Patient; Phone: [phone]; Reason for Call: Patient states was seen by Dr.  Sharen Hones last week for Shingles.  States the Tylenol is not helping with the pain and wants to know if Dr.  Sharen Hones would call something in that is stronger.  Please call patient back.  Thanks

## 2012-06-04 NOTE — Telephone Encounter (Signed)
Spoke with pt.  Taking tylenol, not touching pain.  No rash developed.  Taking valtrex.  Saw eye doctor, eye looked ok. Will prescribe vicodin.  If not better after a few days - to call us for trial of gabapentin.

## 2012-06-13 ENCOUNTER — Other Ambulatory Visit: Payer: Self-pay | Admitting: Family Medicine

## 2012-06-13 NOTE — Telephone Encounter (Signed)
Sent, will see 07/13/12.

## 2012-06-13 NOTE — Telephone Encounter (Signed)
Received refill request electronically. Last office visit 05/30/12. Is it okay to refill medication?

## 2012-06-14 ENCOUNTER — Encounter: Payer: Self-pay | Admitting: Family Medicine

## 2012-06-14 ENCOUNTER — Ambulatory Visit (INDEPENDENT_AMBULATORY_CARE_PROVIDER_SITE_OTHER): Payer: Medicare Other | Admitting: Family Medicine

## 2012-06-14 ENCOUNTER — Telehealth: Payer: Self-pay | Admitting: Family Medicine

## 2012-06-14 VITALS — BP 130/82 | HR 64 | Temp 98.2°F | Wt 185.5 lb

## 2012-06-14 DIAGNOSIS — B029 Zoster without complications: Secondary | ICD-10-CM | POA: Diagnosis not present

## 2012-06-14 MED ORDER — GABAPENTIN 300 MG PO CAPS: 300.0000 mg | ORAL_CAPSULE | Freq: Two times a day (BID) | ORAL | Status: DC

## 2012-06-14 MED ORDER — VALACYCLOVIR HCL 1 G PO TABS: 1000.0000 mg | ORAL_TABLET | Freq: Three times a day (TID) | ORAL | Status: DC

## 2012-06-14 MED ORDER — HYDROCODONE-ACETAMINOPHEN 5-500 MG PO TABS: 1.0000 | ORAL_TABLET | Freq: Three times a day (TID) | ORAL | Status: DC | PRN

## 2012-06-14 MED ORDER — OXYCODONE-ACETAMINOPHEN 5-325 MG PO TABS: 1.0000 | ORAL_TABLET | Freq: Three times a day (TID) | ORAL | Status: DC | PRN

## 2012-06-14 NOTE — Progress Notes (Signed)
  Subjective:    Patient ID: Edward Oconnell, male    DOB: August 14, 1946, 66 y.o.   MRN: [id number]  HPI CC:  F/u shingles  See prior note for details.  Briefly, seen here 05/30/2012, dx with atypical shingles and started on 7d course of valtrex 1gm tid.  Called in 5d later requesting stronger pain medicine - prescribed vicodin.  Finished valtrex course last week. Given location, there was concern for herpes zoster opthalmicus, so he saw his ophthalmologist as well.  returns today with continued constant headache, eye ache and some tooth ache.  This has been going on for 3 weeks now. Now spread of pain to right lateral nose. Also having some scaly flaking of skin on left forehead - this is only rash that has developed. vicodin helps pain for 1 hour then pain returns.  No fevers/chills.  Eye watery and matted in morning.  Some ST   H/o renal transplant, sees [facility].  On prograf. Had not had shingles shot.  Review of Systems Per HPI    Objective:   Physical Exam  Nursing note and vitals reviewed. Constitutional: He appears well-developed and well-nourished. No distress.  HENT:  Head: Normocephalic and atraumatic.    Right Ear: Hearing, tympanic membrane, external ear and ear canal normal.  Left Ear: Hearing, tympanic membrane, external ear and ear canal normal.  Nose: Nose normal. No mucosal edema or rhinorrhea.  Mouth/Throat: Uvula is midline, oropharynx is clear and moist and mucous membranes are normal. No oropharyngeal exudate, posterior oropharyngeal edema, posterior oropharyngeal erythema or tonsillar abscesses.       Sensitive to touch across right forehead and right scalp, now sensitive periorbitally and right nasal bridge  Eyes: Conjunctivae normal and EOM are normal. Pupils are equal, round, and reactive to light. No scleral icterus. Right eye exhibits normal extraocular motion. Left eye exhibits normal extraocular motion.  Neck: Normal range of motion. Neck supple.    Lymphadenopathy:    He has no cervical adenopathy.  Neurological: No cranial nerve deficit or sensory deficit.       CN 2-12 intact.  Skin: Skin is warm and dry. Rash noted.       Papular rash across right forehead and evident at right nasal bridge.  Not vesicular.  tender       Assessment & Plan:

## 2012-06-14 NOTE — Telephone Encounter (Signed)
Seen today. 

## 2012-06-14 NOTE — Telephone Encounter (Signed)
Caller: [name]/Patient; Patient Name: Edward Oconnell; PCP: Eustaquio Boyden [facility]); Best Callback Phone Number: [phone]. States that he was in office approximately 3 weeks ago and treated for Shingles. Is out of medication and is still having symptoms. Still having Shingles to forehead and is moving down face to eyebrow and down right side of nose. Painful. Burns. Thinks he has a fever today. Sore throat. "It feels like a toothache, headache and a earache." Decreased appetite. All emergent symptoms per Skin Lesions protocol ruled out. Call provider immediately disposition. Made appointment for today at 1545 with Dr. Oren Section.

## 2012-06-14 NOTE — Patient Instructions (Addendum)
I'm sorry this isn't getting better as quickly as we had hoped for. Do another course of valcyclovir for 7 more days. Take percocets as needed. Start gabapentin at night time for 4 days then twice daily to see if this will help nerve pain. Keep appt tomorrow with eye doctor.

## 2012-06-14 NOTE — Assessment & Plan Note (Signed)
Evident rash along trigeminal V1 distribution, however papular so not typical of herpes zoster. Now going on 3 wks. With eye involvement, anticipate herpes zoster ophthalmicus, however did start valtrex early. Has appt with ophtho tomorrow morning for f/u. Given spread, will prescribe another valtrex course, increase analgesic to vicodin, and start gabapentin.  Discussed sedation precautions with percocets and dizzy precautions with gabapentin. Concern for post herpetic neuralgia.

## 2012-06-15 DIAGNOSIS — B0232 Zoster iridocyclitis: Secondary | ICD-10-CM | POA: Diagnosis not present

## 2012-06-26 ENCOUNTER — Telehealth: Payer: Self-pay

## 2012-06-26 NOTE — Telephone Encounter (Signed)
Pt finished med for shingles last week; pt has taken 2 prescriptions of shingles med. Pt is presently taking steroid 4 times a day per eye dr. Rock Nephew has f/u appt to see eye dr on 06/29/12. Pt can touch forehead without pain now; but right eye ,head and side of neck with difficulty swallowing still bothers pt.Please advise. [facility].

## 2012-06-26 NOTE — Telephone Encounter (Signed)
Spoke with pt - having ST with swallowing.  Recommended ibuprofen for this.  May try increased gabapentin to tid for discomfort around eye from shingles rash.  To call us with update.

## 2012-06-29 DIAGNOSIS — B0232 Zoster iridocyclitis: Secondary | ICD-10-CM | POA: Diagnosis not present

## 2012-07-06 DIAGNOSIS — B0232 Zoster iridocyclitis: Secondary | ICD-10-CM | POA: Diagnosis not present

## 2012-07-13 ENCOUNTER — Encounter: Payer: Medicare Other | Admitting: Family Medicine

## 2012-07-13 DIAGNOSIS — B0232 Zoster iridocyclitis: Secondary | ICD-10-CM | POA: Diagnosis not present

## 2012-07-14 ENCOUNTER — Other Ambulatory Visit: Payer: Self-pay | Admitting: Family Medicine

## 2012-07-15 NOTE — Telephone Encounter (Signed)
I don't see where this Rx originated; clarify with pharmacy about originating MD.  Thanks.

## 2012-07-15 NOTE — Telephone Encounter (Signed)
To pcp.  i don't think i've prescribed this for recent shingles.

## 2012-07-16 NOTE — Telephone Encounter (Signed)
Spoke to pharmacist and was advised that this was originally written by Dr. Rosalee Kaufman 12/05/10.

## 2012-07-17 NOTE — Telephone Encounter (Signed)
Okay to send for 1 month, needs OV to ext with new MD.

## 2012-07-18 NOTE — Telephone Encounter (Signed)
Patient notified as instructed by telephone. Was advised that he has a physical scheduled in January with Dr. Para March.

## 2012-07-20 DIAGNOSIS — B0232 Zoster iridocyclitis: Secondary | ICD-10-CM | POA: Diagnosis not present

## 2012-07-25 ENCOUNTER — Other Ambulatory Visit: Payer: Self-pay | Admitting: Family Medicine

## 2012-07-26 DIAGNOSIS — Z48298 Encounter for aftercare following other organ transplant: Secondary | ICD-10-CM | POA: Diagnosis not present

## 2012-07-26 DIAGNOSIS — Z79899 Other long term (current) drug therapy: Secondary | ICD-10-CM | POA: Diagnosis not present

## 2012-07-26 DIAGNOSIS — E785 Hyperlipidemia, unspecified: Secondary | ICD-10-CM | POA: Diagnosis not present

## 2012-07-26 DIAGNOSIS — N185 Chronic kidney disease, stage 5: Secondary | ICD-10-CM | POA: Diagnosis not present

## 2012-07-26 DIAGNOSIS — T8610 Unspecified complication of kidney transplant: Secondary | ICD-10-CM | POA: Diagnosis not present

## 2012-07-26 DIAGNOSIS — E039 Hypothyroidism, unspecified: Secondary | ICD-10-CM | POA: Diagnosis not present

## 2012-07-26 DIAGNOSIS — Z23 Encounter for immunization: Secondary | ICD-10-CM | POA: Diagnosis not present

## 2012-08-03 DIAGNOSIS — B0232 Zoster iridocyclitis: Secondary | ICD-10-CM | POA: Diagnosis not present

## 2012-08-12 DIAGNOSIS — Z8619 Personal history of other infectious and parasitic diseases: Secondary | ICD-10-CM

## 2012-08-12 HISTORY — DX: Personal history of other infectious and parasitic diseases: Z86.19

## 2012-08-24 DIAGNOSIS — B0232 Zoster iridocyclitis: Secondary | ICD-10-CM | POA: Diagnosis not present

## 2012-08-29 ENCOUNTER — Other Ambulatory Visit: Payer: Self-pay

## 2012-08-29 DIAGNOSIS — Z79899 Other long term (current) drug therapy: Secondary | ICD-10-CM | POA: Diagnosis not present

## 2012-08-29 DIAGNOSIS — N189 Chronic kidney disease, unspecified: Secondary | ICD-10-CM | POA: Diagnosis not present

## 2012-08-29 LAB — BASIC METABOLIC PANEL
Anion Gap: 8 (ref 7–16)
BUN: 43 mg/dL — ABNORMAL HIGH (ref 7–18)
Calcium, Total: 9.2 mg/dL (ref 8.5–10.1)
Chloride: 106 mmol/L (ref 98–107)
Co2: 24 mmol/L (ref 21–32)
Creatinine: 2.92 mg/dL — ABNORMAL HIGH (ref 0.60–1.30)
EGFR (African American): 25 — ABNORMAL LOW
EGFR (Non-African Amer.): 21 — ABNORMAL LOW
Osmolality: 287 (ref 275–301)
Potassium: 4.5 mmol/L (ref 3.5–5.1)

## 2012-09-01 ENCOUNTER — Other Ambulatory Visit: Payer: Self-pay | Admitting: Family Medicine

## 2012-09-01 DIAGNOSIS — Z125 Encounter for screening for malignant neoplasm of prostate: Secondary | ICD-10-CM

## 2012-09-01 DIAGNOSIS — E119 Type 2 diabetes mellitus without complications: Secondary | ICD-10-CM

## 2012-09-01 DIAGNOSIS — N400 Enlarged prostate without lower urinary tract symptoms: Secondary | ICD-10-CM

## 2012-09-10 ENCOUNTER — Other Ambulatory Visit (INDEPENDENT_AMBULATORY_CARE_PROVIDER_SITE_OTHER): Payer: Medicare Other

## 2012-09-10 DIAGNOSIS — Z125 Encounter for screening for malignant neoplasm of prostate: Secondary | ICD-10-CM

## 2012-09-10 DIAGNOSIS — E119 Type 2 diabetes mellitus without complications: Secondary | ICD-10-CM

## 2012-09-10 LAB — COMPREHENSIVE METABOLIC PANEL
Albumin: 3.8 g/dL (ref 3.5–5.2)
Alkaline Phosphatase: 77 U/L (ref 39–117)
BUN: 35 mg/dL — ABNORMAL HIGH (ref 6–23)
Creatinine, Ser: 2.8 mg/dL — ABNORMAL HIGH (ref 0.4–1.5)
Glucose, Bld: 163 mg/dL — ABNORMAL HIGH (ref 70–99)
Total Bilirubin: 0.7 mg/dL (ref 0.3–1.2)

## 2012-09-10 LAB — LIPID PANEL
Cholesterol: 146 mg/dL (ref 0–200)
HDL: 26.6 mg/dL — ABNORMAL LOW (ref >39.00)
Triglycerides: 261 mg/dL — ABNORMAL HIGH (ref 0.0–149.0)

## 2012-09-10 LAB — LDL CHOLESTEROL, DIRECT: Direct LDL: 73.3 mg/dL

## 2012-09-10 LAB — HEMOGLOBIN A1C: Hgb A1c MFr Bld: 6.2 % (ref 4.6–6.5)

## 2012-09-10 LAB — PSA, MEDICARE: PSA: 3.4 ng/ml (ref 0.10–4.00)

## 2012-09-10 LAB — TSH: TSH: 0.88 u[IU]/mL (ref 0.35–5.50)

## 2012-09-14 ENCOUNTER — Encounter: Payer: Self-pay | Admitting: Family Medicine

## 2012-09-14 ENCOUNTER — Ambulatory Visit (INDEPENDENT_AMBULATORY_CARE_PROVIDER_SITE_OTHER): Payer: Medicare Other | Admitting: Family Medicine

## 2012-09-14 VITALS — BP 138/84 | HR 59 | Temp 97.5°F | Ht 68.0 in | Wt 186.0 lb

## 2012-09-14 DIAGNOSIS — E782 Mixed hyperlipidemia: Secondary | ICD-10-CM

## 2012-09-14 DIAGNOSIS — Z0000 Encounter for general adult medical examination without abnormal findings: Secondary | ICD-10-CM | POA: Diagnosis not present

## 2012-09-14 DIAGNOSIS — I10 Essential (primary) hypertension: Secondary | ICD-10-CM | POA: Diagnosis not present

## 2012-09-14 DIAGNOSIS — E119 Type 2 diabetes mellitus without complications: Secondary | ICD-10-CM

## 2012-09-14 DIAGNOSIS — Q613 Polycystic kidney, unspecified: Secondary | ICD-10-CM

## 2012-09-14 DIAGNOSIS — E039 Hypothyroidism, unspecified: Secondary | ICD-10-CM | POA: Diagnosis not present

## 2012-09-14 NOTE — Patient Instructions (Addendum)
Ask [facility] about the pneumonia shot.   Don't change your meds for now and we'll send the note/labs to [facility].  Take care.  Glad to see you.

## 2012-09-16 DIAGNOSIS — Z0000 Encounter for general adult medical examination without abnormal findings: Secondary | ICD-10-CM | POA: Insufficient documentation

## 2012-09-16 NOTE — Assessment & Plan Note (Signed)
No meds.  Reasonable control, continue current meds.  D/w pt about low salt low sugar diet.

## 2012-09-16 NOTE — Assessment & Plan Note (Signed)
He's always had high TG.  Reasonable control, continue current meds.  D/w pt about low salt low sugar diet.

## 2012-09-16 NOTE — Assessment & Plan Note (Signed)
Reasonable control, continue current meds.  D/w pt about low salt low sugar diet.

## 2012-09-16 NOTE — Assessment & Plan Note (Signed)
Controlled, TSH wnl, continue current meds.

## 2012-09-16 NOTE — Progress Notes (Signed)
I have personally reviewed the Medicare Annual Wellness questionnaire and have noted 1. The patient's medical and social history 2. Their use of alcohol, tobacco or illicit drugs 3. Their current medications and supplements 4. The patient's functional ability including ADL's, fall risks, home safety risks and hearing or visual             impairment. 5. Diet and physical activities 6. Evidence for depression or mood disorders  The patients weight, height, BMI have been recorded in the chart and visual acuity is per eye clinic.  I have made referrals, counseling and provided education to the patient based review of the above and I have provided the pt with a written personalized care plan for preventive services.  See scanned forms.  Routine anticipatory guidance given to patient.  See health maintenance. Flu 2013 Shingles not indicated.  PNA pending, see instructions Tetanus 2012 Colonoscopy 2010 Prostate cancer screening- PSA wnl Advance directive d/w pt.  Has a living will.  Wife designated if incapacitated.  Cognitive function addressed- see scanned forms- and if abnormal then additional documentation follows.   S/p renal transplant for cystic renal disease.  Followed at [facility], no fevers on immunosuppressants.  Cr stable.  Feeling at his baseline.  No edema.    Hypertension:    Using medication without problems or lightheadedness: yes Chest pain with exertion:no Edema:no Short of breath:no  Elevated Cholesterol: Using medications without problems:yes Muscle aches: no Diet compliance:discussed Exercise:discussed  Diabetes:  No meds.   Hypoglycemic episodes:no Hyperglycemic episodes:no Feet problems:no eye exam within last year:yes A1c 6.2.   PMH and SH reviewed  Meds, vitals, and allergies reviewed.   ROS: See HPI.  Otherwise negative.    GEN: nad, alert and oriented HEENT: mucous membranes moist NECK: supple w/o LA CV: rrr. PULM: ctab, no inc wob ABD: soft,  +bs, old scar well healed.  EXT: no edema SKIN: no acute rash  Diabetic foot exam: Normal inspection No skin breakdown No calluses  Normal DP pulses Normal sensation to light touch and monofilament Nails normal

## 2012-09-16 NOTE — Assessment & Plan Note (Signed)
See scanned forms. Routine anticipatory guidance given to patient. See health maintenance.  Flu 2013  Shingles not indicated.  PNA pending, see instructions  Tetanus 2012  Colonoscopy 2010  Prostate cancer screening- PSA wnl  Advance directive d/w pt. Has a living will. Wife designated if incapacitated.  Cognitive function addressed- see scanned forms- and if abnormal then additional documentation follows.

## 2012-09-16 NOTE — Assessment & Plan Note (Signed)
S/p transplant and Cr reasonable. Continue as is per [facility].  I'll defer to them.

## 2012-09-17 DIAGNOSIS — B0232 Zoster iridocyclitis: Secondary | ICD-10-CM | POA: Diagnosis not present

## 2012-10-01 DIAGNOSIS — H169 Unspecified keratitis: Secondary | ICD-10-CM | POA: Diagnosis not present

## 2012-10-09 DIAGNOSIS — I10 Essential (primary) hypertension: Secondary | ICD-10-CM | POA: Diagnosis not present

## 2012-10-09 DIAGNOSIS — E039 Hypothyroidism, unspecified: Secondary | ICD-10-CM | POA: Diagnosis not present

## 2012-10-22 DIAGNOSIS — B0232 Zoster iridocyclitis: Secondary | ICD-10-CM | POA: Diagnosis not present

## 2012-10-31 DIAGNOSIS — C44211 Basal cell carcinoma of skin of unspecified ear and external auricular canal: Secondary | ICD-10-CM | POA: Diagnosis not present

## 2012-10-31 DIAGNOSIS — C44529 Squamous cell carcinoma of skin of other part of trunk: Secondary | ICD-10-CM | POA: Diagnosis not present

## 2012-10-31 DIAGNOSIS — Z85828 Personal history of other malignant neoplasm of skin: Secondary | ICD-10-CM | POA: Diagnosis not present

## 2012-10-31 DIAGNOSIS — L570 Actinic keratosis: Secondary | ICD-10-CM | POA: Diagnosis not present

## 2012-10-31 DIAGNOSIS — D485 Neoplasm of uncertain behavior of skin: Secondary | ICD-10-CM | POA: Diagnosis not present

## 2012-11-13 ENCOUNTER — Telehealth: Payer: Self-pay | Admitting: Family Medicine

## 2012-11-13 ENCOUNTER — Encounter: Payer: Self-pay | Admitting: Family Medicine

## 2012-11-13 ENCOUNTER — Ambulatory Visit (INDEPENDENT_AMBULATORY_CARE_PROVIDER_SITE_OTHER): Payer: Medicare Other | Admitting: Family Medicine

## 2012-11-13 VITALS — BP 128/80 | HR 78 | Temp 98.2°F | Wt 184.8 lb

## 2012-11-13 DIAGNOSIS — J069 Acute upper respiratory infection, unspecified: Secondary | ICD-10-CM

## 2012-11-13 DIAGNOSIS — R51 Headache: Secondary | ICD-10-CM | POA: Diagnosis not present

## 2012-11-13 LAB — POCT INFLUENZA A/B: Influenza A, POC: NEGATIVE

## 2012-11-13 NOTE — Progress Notes (Signed)
No recent changes in meds.  Had sx starting last night.  He didn't feel well and then had diarrhea last night X3, twice today.  ST last night with some posterior nasal gtt.  No fevers. No blood in stool. No vomiting.  Prev with a mild HA, but not now.  No known sick contacts. No abd pain, but his stomach feels unsettled. No back pain but he's had some episodic L shoulder pain recently- "I thought it was the way I was laying in the bed."  He doesn't feel worse today than last night.  He was able to work today.  No other sx stated by patient. No rash.  No ear pain.  Had a flu shot this past fall.    Meds, vitals, and allergies reviewed.   ROS: See HPI.  Otherwise, noncontributory.  GEN: nad, alert and oriented HEENT: mucous membranes moist, tm w/o erythema, nasal exam w/o erythema, clear discharge noted,  OP with cobblestoning NECK: supple w/o LA CV: rrr.   PULM: ctab, no inc wob EXT: no edema SKIN: no acute rash  Flu neg.

## 2012-11-13 NOTE — Patient Instructions (Addendum)
Drink plenty of fluids, take tylenol as needed, and gargle with warm salt water for your throat.  This should gradually improve.  Take care.  Let us know if you have other concerns, especially if you have more swelling or a fever.

## 2012-11-13 NOTE — Telephone Encounter (Signed)
Patient Information:  Caller Name: Riad  Phone: [phone]  Patient: Edward Oconnell, Edward Oconnell  Gender: Male  DOB: 1945/10/31  Age: 67 Years  PCP: Crawford Givens Clelia Croft) [facility])  Office Follow Up:  Does the office need to follow up with this patient?: No  Instructions For The Office: N/A  RN Note:  Appt set up for 1430 per Rose with Dr. Para March  Symptoms  Reason For Call & Symptoms: Pt calling regarding sore throat, nausea, and drainage. Feels like he may have fever. No thermometer. No known flu exposure. Hx of kidney transplant (2 yrs ago) and recent shingles.  Reviewed Health History In EMR: Yes  Reviewed Medications In EMR: Yes  Reviewed Allergies In EMR: Yes  Reviewed Surgeries / Procedures: Yes  Date of Onset of Symptoms: 11/12/2012  Guideline(s) Used:  Influenza - Seasonal  Disposition Per Guideline:   Discuss with PCP and Callback by Nurse within 1 Hour  Reason For Disposition Reached:   HIGH RISK (e.g., age > 64 years, pregnant, HIV+, chronic medical condition) and flu symptoms  Advice Given:  N/A

## 2012-11-14 DIAGNOSIS — J069 Acute upper respiratory infection, unspecified: Secondary | ICD-10-CM | POA: Insufficient documentation

## 2012-11-14 NOTE — Assessment & Plan Note (Signed)
Nontoxic, likely viral.  Flu neg.  Benign exam.  F/u prn.  Supportive tx in meantime.  ddx d/w pt.  He agrees with plan.

## 2012-11-15 HISTORY — PX: OTHER SURGICAL HISTORY: SHX169

## 2012-11-21 HISTORY — PX: OTHER SURGICAL HISTORY: SHX169

## 2012-11-22 DIAGNOSIS — C44529 Squamous cell carcinoma of skin of other part of trunk: Secondary | ICD-10-CM | POA: Diagnosis not present

## 2012-12-03 DIAGNOSIS — B0232 Zoster iridocyclitis: Secondary | ICD-10-CM | POA: Diagnosis not present

## 2012-12-05 ENCOUNTER — Encounter (HOSPITAL_COMMUNITY)
Admission: EM | Disposition: A | Discharge status: Home / Self Care | Payer: Self-pay | Source: Home / Self Care | Attending: Cardiovascular Disease

## 2012-12-05 ENCOUNTER — Inpatient Hospital Stay (HOSPITAL_COMMUNITY)
Admission: EM | Admit: 2012-12-05 | Discharge: 2012-12-12 | DRG: 237 | Disposition: A | Discharge status: Home / Self Care | Payer: Medicare Other | Attending: Cardiovascular Disease | Admitting: Cardiovascular Disease

## 2012-12-05 ENCOUNTER — Emergency Department (HOSPITAL_COMMUNITY): Payer: Medicare Other | Admitting: Anesthesiology

## 2012-12-05 ENCOUNTER — Encounter (HOSPITAL_COMMUNITY): Payer: Self-pay | Admitting: Anesthesiology

## 2012-12-05 ENCOUNTER — Ambulatory Visit (HOSPITAL_COMMUNITY): Admit: 2012-12-05 | Payer: Self-pay | Admitting: Cardiovascular Disease

## 2012-12-05 DIAGNOSIS — I2510 Atherosclerotic heart disease of native coronary artery without angina pectoris: Secondary | ICD-10-CM | POA: Diagnosis not present

## 2012-12-05 DIAGNOSIS — Z955 Presence of coronary angioplasty implant and graft: Secondary | ICD-10-CM

## 2012-12-05 DIAGNOSIS — R0789 Other chest pain: Secondary | ICD-10-CM | POA: Diagnosis not present

## 2012-12-05 DIAGNOSIS — I369 Nonrheumatic tricuspid valve disorder, unspecified: Secondary | ICD-10-CM | POA: Diagnosis not present

## 2012-12-05 DIAGNOSIS — J9601 Acute respiratory failure with hypoxia: Secondary | ICD-10-CM

## 2012-12-05 DIAGNOSIS — D649 Anemia, unspecified: Secondary | ICD-10-CM | POA: Diagnosis present

## 2012-12-05 DIAGNOSIS — E782 Mixed hyperlipidemia: Secondary | ICD-10-CM | POA: Diagnosis present

## 2012-12-05 DIAGNOSIS — I469 Cardiac arrest, cause unspecified: Secondary | ICD-10-CM

## 2012-12-05 DIAGNOSIS — K279 Peptic ulcer, site unspecified, unspecified as acute or chronic, without hemorrhage or perforation: Secondary | ICD-10-CM | POA: Diagnosis present

## 2012-12-05 DIAGNOSIS — N183 Chronic kidney disease, stage 3 unspecified: Secondary | ICD-10-CM | POA: Diagnosis present

## 2012-12-05 DIAGNOSIS — I213 ST elevation (STEMI) myocardial infarction of unspecified site: Secondary | ICD-10-CM

## 2012-12-05 DIAGNOSIS — Z940 Kidney transplant status: Secondary | ICD-10-CM

## 2012-12-05 DIAGNOSIS — N189 Chronic kidney disease, unspecified: Secondary | ICD-10-CM | POA: Diagnosis not present

## 2012-12-05 DIAGNOSIS — N184 Chronic kidney disease, stage 4 (severe): Secondary | ICD-10-CM

## 2012-12-05 DIAGNOSIS — I2109 ST elevation (STEMI) myocardial infarction involving other coronary artery of anterior wall: Secondary | ICD-10-CM

## 2012-12-05 DIAGNOSIS — Z452 Encounter for adjustment and management of vascular access device: Secondary | ICD-10-CM | POA: Diagnosis not present

## 2012-12-05 DIAGNOSIS — K219 Gastro-esophageal reflux disease without esophagitis: Secondary | ICD-10-CM | POA: Diagnosis present

## 2012-12-05 DIAGNOSIS — E876 Hypokalemia: Secondary | ICD-10-CM | POA: Diagnosis present

## 2012-12-05 DIAGNOSIS — E119 Type 2 diabetes mellitus without complications: Secondary | ICD-10-CM | POA: Diagnosis present

## 2012-12-05 DIAGNOSIS — J9600 Acute respiratory failure, unspecified whether with hypoxia or hypercapnia: Secondary | ICD-10-CM | POA: Diagnosis present

## 2012-12-05 DIAGNOSIS — I4901 Ventricular fibrillation: Secondary | ICD-10-CM | POA: Diagnosis not present

## 2012-12-05 DIAGNOSIS — I10 Essential (primary) hypertension: Secondary | ICD-10-CM | POA: Diagnosis not present

## 2012-12-05 DIAGNOSIS — I214 Non-ST elevation (NSTEMI) myocardial infarction: Secondary | ICD-10-CM | POA: Diagnosis not present

## 2012-12-05 DIAGNOSIS — E039 Hypothyroidism, unspecified: Secondary | ICD-10-CM | POA: Diagnosis present

## 2012-12-05 DIAGNOSIS — Z85828 Personal history of other malignant neoplasm of skin: Secondary | ICD-10-CM | POA: Diagnosis not present

## 2012-12-05 DIAGNOSIS — R570 Cardiogenic shock: Secondary | ICD-10-CM | POA: Diagnosis not present

## 2012-12-05 DIAGNOSIS — Z4682 Encounter for fitting and adjustment of non-vascular catheter: Secondary | ICD-10-CM | POA: Diagnosis not present

## 2012-12-05 DIAGNOSIS — N400 Enlarged prostate without lower urinary tract symptoms: Secondary | ICD-10-CM | POA: Diagnosis present

## 2012-12-05 DIAGNOSIS — Z992 Dependence on renal dialysis: Secondary | ICD-10-CM

## 2012-12-05 DIAGNOSIS — N179 Acute kidney failure, unspecified: Secondary | ICD-10-CM | POA: Diagnosis not present

## 2012-12-05 DIAGNOSIS — Q612 Polycystic kidney, adult type: Secondary | ICD-10-CM

## 2012-12-05 DIAGNOSIS — I219 Acute myocardial infarction, unspecified: Secondary | ICD-10-CM | POA: Diagnosis not present

## 2012-12-05 DIAGNOSIS — I129 Hypertensive chronic kidney disease with stage 1 through stage 4 chronic kidney disease, or unspecified chronic kidney disease: Secondary | ICD-10-CM | POA: Diagnosis present

## 2012-12-05 DIAGNOSIS — J9819 Other pulmonary collapse: Secondary | ICD-10-CM | POA: Diagnosis not present

## 2012-12-05 DIAGNOSIS — Z8673 Personal history of transient ischemic attack (TIA), and cerebral infarction without residual deficits: Secondary | ICD-10-CM

## 2012-12-05 DIAGNOSIS — I498 Other specified cardiac arrhythmias: Secondary | ICD-10-CM | POA: Diagnosis present

## 2012-12-05 DIAGNOSIS — R001 Bradycardia, unspecified: Secondary | ICD-10-CM

## 2012-12-05 HISTORY — DX: Kidney transplant status: Z94.0

## 2012-12-05 HISTORY — PX: LEFT HEART CATHETERIZATION WITH CORONARY ANGIOGRAM: SHX5451

## 2012-12-05 HISTORY — DX: Gastro-esophageal reflux disease without esophagitis: K21.9

## 2012-12-05 HISTORY — DX: Chronic kidney disease, stage 4 (severe): N18.4

## 2012-12-05 HISTORY — PX: CORONARY ANGIOPLASTY WITH STENT PLACEMENT: SHX49

## 2012-12-05 HISTORY — DX: Personal history of other infectious and parasitic diseases: Z86.19

## 2012-12-05 HISTORY — DX: Cardiac arrest, cause unspecified: I46.9

## 2012-12-05 SURGERY — LEFT HEART CATHETERIZATION WITH CORONARY ANGIOGRAM
Anesthesia: LOCAL

## 2012-12-05 MED ORDER — LIDOCAINE HCL (PF) 1 % IJ SOLN
INTRAMUSCULAR | Status: AC
  Filled 2012-12-05: qty 30

## 2012-12-05 MED ORDER — SODIUM CHLORIDE 0.9 % IV SOLN
25.0000 ug/h | INTRAVENOUS | Status: DC
  Filled 2012-12-05: qty 50

## 2012-12-05 MED ORDER — AMIODARONE HCL 150 MG/3ML IV SOLN
INTRAVENOUS | Status: AC
  Filled 2012-12-05: qty 6

## 2012-12-05 MED ORDER — ATROPINE SULFATE 1 MG/ML IJ SOLN
INTRAMUSCULAR | Status: AC
  Filled 2012-12-05: qty 1

## 2012-12-05 MED ORDER — PANTOPRAZOLE SODIUM 40 MG PO PACK
40.0000 mg | PACK | Freq: Every day | ORAL | Status: DC
  Administered 2012-12-06: 40 mg
  Filled 2012-12-05 (×2): qty 20

## 2012-12-05 MED ORDER — FENTANYL BOLUS VIA INFUSION: 25.0000 ug | Freq: Four times a day (QID) | INTRAVENOUS | Status: DC | PRN

## 2012-12-05 MED ORDER — SODIUM BICARBONATE 8.4 % IV SOLN
INTRAVENOUS | Status: AC
  Filled 2012-12-05: qty 50

## 2012-12-05 MED ORDER — LIDOCAINE IN D5W 4-5 MG/ML-% IV SOLN
1.0000 mg/min | INTRAVENOUS | Status: DC
  Filled 2012-12-05: qty 250

## 2012-12-05 MED ORDER — AMIODARONE HCL IN DEXTROSE 360-4.14 MG/200ML-% IV SOLN
60.0000 mg/h | INTRAVENOUS | Status: AC
  Administered 2012-12-06: 33.33 mL/h via INTRAVENOUS
  Filled 2012-12-05 (×3): qty 200

## 2012-12-05 MED ORDER — MIDAZOLAM HCL 2 MG/2ML IJ SOLN
INTRAMUSCULAR | Status: AC
  Filled 2012-12-05: qty 2

## 2012-12-05 MED ORDER — HEPARIN (PORCINE) IN NACL 2-0.9 UNIT/ML-% IJ SOLN
INTRAMUSCULAR | Status: AC
  Filled 2012-12-05: qty 1000

## 2012-12-05 MED ORDER — SODIUM CHLORIDE 0.9 % IV SOLN
1.0000 mg/h | INTRAVENOUS | Status: DC
  Filled 2012-12-05: qty 10

## 2012-12-05 MED ORDER — FENTANYL CITRATE 0.05 MG/ML IJ SOLN
INTRAMUSCULAR | Status: AC
  Filled 2012-12-05: qty 2

## 2012-12-05 MED ORDER — EPTIFIBATIDE BOLUS VIA INFUSION
180.0000 ug/kg | Freq: Once | INTRAVENOUS | Status: DC
  Filled 2012-12-05: qty 20

## 2012-12-05 MED ORDER — MIDAZOLAM BOLUS VIA INFUSION: 1.0000 mg | INTRAVENOUS | Status: DC | PRN

## 2012-12-05 MED ORDER — ETOMIDATE 2 MG/ML IV SOLN
INTRAVENOUS | Status: DC | PRN
  Administered 2012-12-05: 18 mg via INTRAVENOUS

## 2012-12-05 MED ORDER — BIVALIRUDIN 250 MG IV SOLR
INTRAVENOUS | Status: AC
  Filled 2012-12-05: qty 250

## 2012-12-05 MED ORDER — EPTIFIBATIDE 75 MG/100ML IV SOLN
INTRAVENOUS | Status: AC
  Filled 2012-12-05: qty 100

## 2012-12-05 MED ORDER — EPTIFIBATIDE 75 MG/100ML IV SOLN
1.0000 ug/kg/min | INTRAVENOUS | Status: DC
  Filled 2012-12-05: qty 100

## 2012-12-05 MED ORDER — ROCURONIUM BROMIDE 100 MG/10ML IV SOLN
INTRAVENOUS | Status: DC | PRN
  Administered 2012-12-05: 50 mg via INTRAVENOUS

## 2012-12-05 NOTE — Anesthesia Postprocedure Evaluation (Signed)
  Anesthesia Post-op Note  Patient: Edward Oconnell  Procedure(s) Performed: Procedure(s): LEFT HEART CATHETERIZATION WITH CORONARY ANGIOGRAM (N/A)  Patient Location: PACU and Cath Lab  Anesthesia Type:General  Level of Consciousness: sedated and unresponsive  Airway and Oxygen Therapy: Patient remains intubated per anesthesia plan and Patient placed on Ventilator (see vital sign flow sheet for setting)  Post-op Pain: none  Post-op Assessment: PATIENT'S CARDIOVASCULAR STATUS UNSTABLE and Respiratory Function Stable, cont in cath lab  Post-op Vital Signs: unstable  Complications: No apparent anesthesia complications

## 2012-12-05 NOTE — ED Provider Notes (Signed)
I saw and evaluated the patient, reviewed the resident's note and I agree with the findings and plan.  Juliet Rude. Rubin Payor, MD 12/05/12 [phone]

## 2012-12-05 NOTE — Transfer of Care (Signed)
Immediate Anesthesia Transfer of Care Note  Patient: Edward Oconnell  Procedure(s) Performed: Procedure(s): LEFT HEART CATHETERIZATION WITH CORONARY ANGIOGRAM (N/A)  Patient Location: PACU and Cath Lab  Anesthesia Type:General  Level of Consciousness: sedated and unresponsive  Airway & Oxygen Therapy: Patient remains intubated per anesthesia plan and Patient placed on Ventilator (see vital sign flow sheet for setting)  Post-op Assessment: Dr Dicie Beam cont cath  Post vital signs: unstable  Complications: No apparent anesthesia complications

## 2012-12-05 NOTE — ED Notes (Signed)
Per EMS: pt from [facility]. Pt walked into fire department complaining of CP that started an hour prior to arriving at fire department. Pt took 1 baby aspirin given 3 more for a total of 324 mg of aspirin. Pt also given of Fentanyl in route for pain.

## 2012-12-05 NOTE — Anesthesia Preprocedure Evaluation (Signed)
Anesthesia Evaluation  Patient identified by MRN, date of birth, ID band Patient awake    Reviewed: Allergy & Precautions, H&P , NPO status , Patient's Chart, lab work & pertinent test results, Unable to perform ROS - Chart review only  History of Anesthesia Complications Negative for: history of anesthetic complications  Airway Mallampati: I TM Distance: >3 FB Neck ROM: Full    Dental  (+) Teeth Intact and Dental Advisory Given   Pulmonary neg pulmonary ROS,  breath sounds clear to auscultation  Pulmonary exam normal       Cardiovascular hypertension, + CAD, + Past MI (presently acute anterior MI, in cath lab) and + CABG - Peripheral Vascular Disease + dysrhythmias (VT x 5 ) Ventricular Tachycardia Rhythm:Regular Rate:Normal     Neuro/Psych CVA negative psych ROS   GI/Hepatic negative GI ROS,   Endo/Other  diabetes, Well Controlled, Type 2Hypothyroidism   Renal/GU Renal InsufficiencyRenal disease (s/p renal transplant)     Musculoskeletal   Abdominal   Peds  Hematology   Anesthesia Other Findings   Reproductive/Obstetrics                           Anesthesia Physical Anesthesia Plan  ASA: IV and emergent  Anesthesia Plan: General   Post-op Pain Management:    Induction: Intravenous  Airway Management Planned: Oral ETT  Additional Equipment:   Intra-op Plan:   Post-operative Plan: Post-operative intubation/ventilation  Informed Consent: I have reviewed the patients History and Physical, chart, labs and discussed the procedure including the risks, benefits and alternatives for the proposed anesthesia with the patient or authorized representative who has indicated his/her understanding and acceptance.   Dental advisory given  Plan Discussed with: CRNA and Surgeon  Anesthesia Plan Comments: (Plan routine monitors, GETA with cricoid pressure)        Anesthesia Quick  Evaluation

## 2012-12-05 NOTE — H&P (Signed)
Cardiology H&P  Primary Care Povider: Crawford Givens, MD Primary Cardiologist: none   HPI: [name] is a 67 y.o.male with hx of 3 vessel CAD, multiple CHD risk factors including DM, HTN, CKD s/p renal transplant at [facility] presented via EMS to [facility] ED with acute onset of chest pain. Episode began approximately one hour prior to arrival. Described as severe, central and radiating to his left arm. EMS 12-lead demonstrated ST elevation in the anterior and inferior leads and CODE STEMI was called. On arrival, patient was brought emergently to cath lab. There the patient had multiple episodes of v-fib requiring defibrillation. He was intubated emergently and initiated on an amiodarone and later a lidocaine gtt. Cath revealed proximal LAD disease, a 100% mid occlusion of a small non-dominant RCA and diffuse non-obstructive disease in the circumflex. He went for PCI to LAD with DES and is now being transferred to CCU. Of note, the non-dominant RCA recanalized during the procedure. Because the patient continued to demonstrate electrical instability during the case, an IABP and transvenous pacer were placed before departing the cath lab.    Past Medical History  Diagnosis Date  . CAD (coronary artery disease)     nonobstructive  . Hypertension   . Hypothyroidism   . Benign prostatic hypertrophy   . Chronic kidney disease     s/p transplant per [facility]  . Hyperlipidemia   . Stroke   . Diabetes mellitus without complication     Past Surgical History  Procedure Laterality Date  . Kidney transplant      09/2010    Family History  Problem Relation Age of Onset  . Kidney failure Mother     was on dialysis 3 times a week when she died  . Alcohol abuse Mother   . Hypertension Father   . Heart attack Sister   . Kidney disease Sister   . Aneurysm Sister   . COPD Sister   . Colon cancer Neg Hx   . Prostate cancer Neg Hx     Social History:  reports that he has never smoked. He has never  used smokeless tobacco. He reports that he does not drink alcohol or use illicit drugs.  Allergies: No Known Allergies  Current Facility-Administered Medications  Medication Dose Route Frequency Provider Last Rate Last Dose  . amiodarone (NEXTERONE PREMIX) 360 mg/200 mL dextrose IV infusion  60 mg/hr Intravenous STAT Kathleene Hazel, MD      . eptifibatide (INTEGRILIN) 0.75 mg/mL bolus via infusion 14,900 mcg  180 mcg/kg Intravenous Once Kathleene Hazel, MD      . eptifibatide (INTEGRILIN) 75 mg / 100 mL infusion  1 mcg/kg/min Intravenous Continuous Kathleene Hazel, MD      . fentaNYL (SUBLIMAZE) 10 mcg/mL in sodium chloride 0.9 % 250 mL infusion  25-300 mcg/hr Intravenous STAT Kathleene Hazel, MD      . midazolam (VERSED) 1 mg/mL in sodium chloride 0.9 % 50 mL infusion  1-10 mg/hr Intravenous STAT Kathleene Hazel, MD        ROS: A full review of systems is obtained and is negative except as noted in the HPI.  Physical Exam (before cath): Weight 83 kg (182 lb 15.7 oz).  GENERAL: Stoic, age-appropriate white male.  EYES: Extra ocular movements are intact.  ENT: Oropharynx is clear. Dentition is within normal limits.  NECK: Supple. The thyroid is not enlarged.  LYMPH: There are no masses or lymphadenopathy present.  HEART: Tachycardic, irreg irreg.  No m/g/r.  Normal S2. Marland Kitchen No JVD LUNGS: Clear to auscultation There are no rales, rhonchi, or wheezes.  ABDOMEN: Soft, non-tender, and non-distended with normoactive bowel sounds. There is no hepatosplenomegaly.  EXTREMITIES: No clubbing, cyanosis, or edema.  PULSES: Radial pulses were +2 and equal bilaterally. DP/PT pulses were +2 and equal bilaterally.  SKIN: Warm, dry, and intact.  NEUROLOGIC: The patient was oriented to person, place, and time. No overt neurologic deficits were detected.  PSYCH: Normal judgment and insight, mood is appropriate.   Results: No results found for this or any previous visit  (from the past 24 hour(s)).  Pre-procedure EKG: Atrial fibrillation, rate 130s. Anterior and inferior injury pattern, consistent with acute MI.  CXR: not performed  Impression:  1. Acute anterior STEMI s/p DES x 1 proximal LAD  2. Occlusion of small, non-dominant RCA with resolution of thrombus with anti-coagulation and anti-thrombotic therapy (too small for PCI)  3. Cardiogenic shock, IABP in place.  4. Ventricular fibrillation arrest  5. Respiratory failure  6. Hx of renal transplant on immunosuppresion  Plan:  1. Admit to CCU. Standard admit labs. Serial ECGs and troponins. Check echo in AM.  2. Standard ACS therapies:   - dual anti-platelet with Brilinta and ASA, high dose statin, beta-blockerHe will be admitted to the CCU.   - c/w Integrilin gtt for 18 hours  3. Continue Lidocaine and amiodarone drips.  4. C/w IABP at 1:1 overnight.  5. C/w mechanical ventilation. Will ask PCCM to assist. 6. C/w home immunosuppresive agents. Consider renal consult in AM to assist with mgmt.    Kalyiah Saintil 12/05/2012, 9:33 PM

## 2012-12-05 NOTE — CV Procedure (Signed)
Cardiac Catheterization Operative Report  Edward Oconnell [id number] 3/26/201410:28 PM Crawford Givens, MD  Procedure Performed:  1. Left Heart Catheterization 2. Selective Coronary Angiography 3. PTCA/DES x 1 proximal LAD 4. Placement temporary transvenous pacemaker 5. IABP placement RFA  Operator: Verne Carrow, MD  Indication:  67 yo male with history of DM, HTN, HLD, CAD (moderately severe disease by cath 2004 with 70% proximal LAD, 70% proximal intermediate), chronic kidney disease s/p kidney transplant (last creatinine 2.8 on 11/13/12) admitted via EMS with acute anterior STEMI. Code STEMI activated en route from [facility].   Note: Non-system delay in PCI. Pt had onset of ventricular fibrillation 2 minutes after arrival to cath lab and was defibrillated x 3. At this point, anesthesia was called for emergent intubation for airway protection. During arterial access, he had onset of v. Fib requiring 2 more defibrillations.                                    Procedure Details: Emergent consent obtained. The patient was brought to the cath lab via EMS as a code STEMI. Pt had onset of ventricular fibrillation 2 minutes after arrival to cath lab and was defibrillated x 3. At this point, anesthesia was called for emergent intubation for airway protection. During arterial access, he had onset of ventricular fibrillation requiring 2 more defibrillations.  He was given amiodarone 300 mg IV x 1 and a drip was started. The patient was sedated with Versed and Fentanyl. The right groin was prepped and draped in the usual manner. Using the modified Seldinger access technique, a 6 French sheath was placed in the right femoral artery. A JR4 catheter was used to engage the small, non-dominant RCA which was found to be totally occluded. This was a small caliber vessel, previously known to be small and non-dominant so I turned my attention to the left system. His EKG changes were anterior. A XB LAD  3.5 guiding catheter was used to engage the left main artery. He was found to have 99% diffuse stenosis from the ostium of the LAD that extended to the mid vessel just prior to the takeoff of the diagonal branch. He was given a bolus of Angiomax and a drip was started. He was given a double bolus of Integrilin and a drip was started. I then advanced a BMW wire down the LAD. A 2.5 x 15 mm balloon was inflated from the mid vessel x 3 back to the ostium. I then deployed a 3.0 x 38 mm Promus Premier DES in the mid LAD extending back to the ostium. The distal segment of the stent was post-dilated with a 3.0 x 21 mm Lublin balloon. The proximal portion of the stent was post-dilated with a 3.25 x 21 mm Doniphan balloon. I then used a 3.5 x 12 mm Barbour balloon to post-dilate the proximal segment of the stent. There was excellent flow down the LAD with no mechanical complications. At this point, he began to have incessant ventricular fibrillation requiring 8 more defibrillations. He then became bradycardic with a junctional rhythm. A 7 French sheath was placed in the right femoral vein and a temporary transvenous pacemaker was positioned in the RV.  I removed the guide from the left main and engaged the RCA with a JR4 guide, with the thought that the small RCA could be the culprit. With the next injection, the RCA was patent with 70%  proximal stenosis. It was felt that this vessel likely had acute thrombotic occlusion that resolved with anti-coagulation and anti-platelet therapy. Given his electrical instablity, an IABP was placed from the right femoral artery. He was started on a lidocaine drip (1mg /min). An amiodarone drip was continued.   The patient stabilized at this point with no further v. Fib.   There were no immediate complications. The patient was taken to the CCU in critical condition.    Hemodynamic Findings: Central aortic pressure: 128/79 Left ventricular pressure: Not obtained due to instability of pt at end of  case  Angiographic Findings:  Left main: 30% ostial stenosis. 20% mid stenosis.   Left Anterior Descending Artery: Large caliber vessel that courses to the apex. The proximal vessel is hazy with diffuse 95-99% stenosis extending from the ostium down to the first diagonal branch. The remainder of the LAD has no flow limiting disease. The diagonal branch is patent ostial 60% stenosis.    Ramus Intermediate: Moderate caliber vessel with 70-80% proximal stenosis.   Circumflex Artery: Large caliber, dominant vessel with 40% proximal stenosis. The first obtuse marginal branch is small in caliber with no obstructive disease. The second obtuse marginal branch is small with no disease. The distal AV groove Circumflex has a focal 50% stenosis. The left posterolateral branch has 50% stenosis.   Right Coronary Artery: Small caliber (1.75 mm) non-dominant vessel with 100% mid occlusion. (This vessel recanalized during the procedure and at the end of the procedure was noted to be small, non-dominant with a proximal 70% stenosis.   Left Ventricular Angiogram: Deferred.   Impression: 1. Acute anterior STEMI secondary to severe proximal LAD stenosis, now s/p DES x 1 proximal LAD 2. Occlusion of small, non-dominant RCA with resolution of thrombus with anti-coagulation and anti-thrombotic therapy (too small for PCI) 3. Cardiogenic shock, IABP in place. 4. Ventricular fibrillation arrest 5. Respiratory failure  Recommendations: He will be admitted to the CCU. Will continue Lidocaine and amiodarone drips. Will continue Integrilin drip for 18 hours. Will continue IABP at 1:1. Will give ASA 324 mg po x 1 and Brilinta 180 mg down the NT tube in the CCU when NG tube placed. Beta blocker as tolerated. High dose statin. Echo in am. Will ask PCCM to assist with ventilator management.        Complications:  None. The patient tolerated the procedure well.

## 2012-12-05 NOTE — ED Notes (Signed)
Pt arrived to trauma room A with cardiologist. Pt transferred over to bed and Cath Lab ready. Pt was placed on zoll R monitor and Cardiologist stated no EKG to go straight to cath lab. Pt transported to cath lab with Cardiologist, RN on monitor.

## 2012-12-05 NOTE — ED Provider Notes (Signed)
MSE was initiated and I personally evaluated the patient and placed orders (if any) at  8:49 PM on December 05, 2012.  The patient appears stable so that the remainder of the MSE may be completed by another provider. 67 y/o male who presents with a 1 hour history of chest pain that began while walking. EMS EKG reviewed. Received ASA and 2 SL nitro by EMS. ABC's intact. Cath lab ready on arrival. The patient is stable for transfer to the Cath lab.   Shanon Ace, MD 12/05/12 2053

## 2012-12-06 ENCOUNTER — Inpatient Hospital Stay (HOSPITAL_COMMUNITY): Payer: Medicare Other

## 2012-12-06 ENCOUNTER — Encounter (HOSPITAL_COMMUNITY): Payer: Self-pay | Admitting: *Deleted

## 2012-12-06 DIAGNOSIS — E119 Type 2 diabetes mellitus without complications: Secondary | ICD-10-CM

## 2012-12-06 DIAGNOSIS — I369 Nonrheumatic tricuspid valve disorder, unspecified: Secondary | ICD-10-CM

## 2012-12-06 DIAGNOSIS — I219 Acute myocardial infarction, unspecified: Secondary | ICD-10-CM

## 2012-12-06 DIAGNOSIS — I2510 Atherosclerotic heart disease of native coronary artery without angina pectoris: Secondary | ICD-10-CM

## 2012-12-06 DIAGNOSIS — N189 Chronic kidney disease, unspecified: Secondary | ICD-10-CM

## 2012-12-06 LAB — POCT I-STAT, CHEM 8
BUN: 27 mg/dL — ABNORMAL HIGH (ref 6–23)
Creatinine, Ser: 1 mg/dL (ref 0.50–1.35)
Glucose, Bld: 144 mg/dL — ABNORMAL HIGH (ref 70–99)
Potassium: 2.4 mEq/L — CL (ref 3.5–5.1)
Sodium: 112 mEq/L — CL (ref 135–145)
TCO2: 17 mmol/L (ref 0–100)

## 2012-12-06 LAB — CBC
MCV: 91.9 fL (ref 78.0–100.0)
Platelets: 244 10*3/uL (ref 150–400)
Platelets: 262 10*3/uL (ref 150–400)
RBC: 2.96 MIL/uL — ABNORMAL LOW (ref 4.22–5.81)
RBC: 3.28 MIL/uL — ABNORMAL LOW (ref 4.22–5.81)
RDW: 14 % (ref 11.5–15.5)
WBC: 10 10*3/uL (ref 4.0–10.5)
WBC: 13.7 10*3/uL — ABNORMAL HIGH (ref 4.0–10.5)

## 2012-12-06 LAB — COMPREHENSIVE METABOLIC PANEL
ALT: 68 U/L — ABNORMAL HIGH (ref 0–53)
AST: 102 U/L — ABNORMAL HIGH (ref 0–37)
Albumin: 3.1 g/dL — ABNORMAL LOW (ref 3.5–5.2)
CO2: 22 mEq/L (ref 19–32)
Calcium: 7.9 mg/dL — ABNORMAL LOW (ref 8.4–10.5)
Creatinine, Ser: 2.3 mg/dL — ABNORMAL HIGH (ref 0.50–1.35)
Sodium: 136 mEq/L (ref 135–145)
Total Protein: 5.3 g/dL — ABNORMAL LOW (ref 6.0–8.3)

## 2012-12-06 LAB — LIPID PANEL: LDL Cholesterol: 46 mg/dL (ref 0–99)

## 2012-12-06 LAB — POCT I-STAT 3, ART BLOOD GAS (G3+)
Acid-base deficit: 2 mmol/L (ref 0.0–2.0)
Bicarbonate: 15.2 mEq/L — ABNORMAL LOW (ref 20.0–24.0)
O2 Saturation: 100 %
Patient temperature: 98.6
pH, Arterial: 7.169 — CL (ref 7.350–7.450)
pO2, Arterial: 465 mmHg — ABNORMAL HIGH (ref 80.0–100.0)

## 2012-12-06 LAB — TSH: TSH: 3.89 u[IU]/mL (ref 0.350–4.500)

## 2012-12-06 LAB — GLUCOSE, CAPILLARY
Glucose-Capillary: 113 mg/dL — ABNORMAL HIGH (ref 70–99)
Glucose-Capillary: 131 mg/dL — ABNORMAL HIGH (ref 70–99)
Glucose-Capillary: 137 mg/dL — ABNORMAL HIGH (ref 70–99)
Glucose-Capillary: 141 mg/dL — ABNORMAL HIGH (ref 70–99)
Glucose-Capillary: 151 mg/dL — ABNORMAL HIGH (ref 70–99)
Glucose-Capillary: 153 mg/dL — ABNORMAL HIGH (ref 70–99)
Glucose-Capillary: 174 mg/dL — ABNORMAL HIGH (ref 70–99)
Glucose-Capillary: 181 mg/dL — ABNORMAL HIGH (ref 70–99)
Glucose-Capillary: 191 mg/dL — ABNORMAL HIGH (ref 70–99)
Glucose-Capillary: 196 mg/dL — ABNORMAL HIGH (ref 70–99)
Glucose-Capillary: 242 mg/dL — ABNORMAL HIGH (ref 70–99)
Glucose-Capillary: 253 mg/dL — ABNORMAL HIGH (ref 70–99)
Glucose-Capillary: 265 mg/dL — ABNORMAL HIGH (ref 70–99)
Glucose-Capillary: 268 mg/dL — ABNORMAL HIGH (ref 70–99)

## 2012-12-06 LAB — CBC WITH DIFFERENTIAL/PLATELET
Basophils Absolute: 0 10*3/uL (ref 0.0–0.1)
Basophils Relative: 0 % (ref 0–1)
Eosinophils Absolute: 0 10*3/uL (ref 0.0–0.7)
Eosinophils Relative: 0 % (ref 0–5)
Lymphs Abs: 0.9 10*3/uL (ref 0.7–4.0)
MCH: 31.2 pg (ref 26.0–34.0)
Neutrophils Relative %: 88 % — ABNORMAL HIGH (ref 43–77)
Platelets: 235 10*3/uL (ref 150–400)
RBC: 3.21 MIL/uL — ABNORMAL LOW (ref 4.22–5.81)
RDW: 13.5 % (ref 11.5–15.5)

## 2012-12-06 LAB — BASIC METABOLIC PANEL
CO2: 20 mEq/L (ref 19–32)
Chloride: 108 mEq/L (ref 96–112)
Creatinine, Ser: 2.23 mg/dL — ABNORMAL HIGH (ref 0.50–1.35)
GFR calc Af Amer: 34 mL/min — ABNORMAL LOW (ref >90)
Potassium: 3.9 mEq/L (ref 3.5–5.1)
Sodium: 140 mEq/L (ref 135–145)

## 2012-12-06 LAB — MRSA PCR SCREENING: MRSA by PCR: NEGATIVE

## 2012-12-06 LAB — HEPATIC FUNCTION PANEL
Bilirubin, Direct: <0.1 mg/dL (ref 0.0–0.3)
Total Bilirubin: 0.3 mg/dL (ref 0.3–1.2)

## 2012-12-06 LAB — TROPONIN I
Troponin I: >20 ng/mL (ref <0.30)
Troponin I: >20 ng/mL (ref <0.30)

## 2012-12-06 LAB — HEMOGLOBIN A1C
Hgb A1c MFr Bld: 6.6 % — ABNORMAL HIGH (ref <5.7)
Mean Plasma Glucose: 143 mg/dL — ABNORMAL HIGH (ref <117)

## 2012-12-06 LAB — PROTIME-INR: Prothrombin Time: 24.3 seconds — ABNORMAL HIGH (ref 11.6–15.2)

## 2012-12-06 LAB — HEPARIN LEVEL (UNFRACTIONATED): Heparin Unfractionated: 0.12 IU/mL — ABNORMAL LOW (ref 0.30–0.70)

## 2012-12-06 MED ORDER — HEPARIN (PORCINE) IN NACL 100-0.45 UNIT/ML-% IJ SOLN
800.0000 [IU]/h | INTRAMUSCULAR | Status: DC
  Administered 2012-12-06: 800 [IU]/h via INTRAVENOUS
  Filled 2012-12-06: qty 250

## 2012-12-06 MED ORDER — "THROMBI-PAD 3""X3"" EX PADS"
1.0000 | MEDICATED_PAD | Freq: Once | CUTANEOUS | Status: AC
  Administered 2012-12-06: 1 via TOPICAL
  Filled 2012-12-06: qty 1

## 2012-12-06 MED ORDER — FENTANYL BOLUS VIA INFUSION
25.0000 ug | Freq: Four times a day (QID) | INTRAVENOUS | Status: DC | PRN
  Filled 2012-12-06: qty 100

## 2012-12-06 MED ORDER — NITROGLYCERIN 0.4 MG SL SUBL: 0.4000 mg | SUBLINGUAL_TABLET | SUBLINGUAL | Status: DC | PRN

## 2012-12-06 MED ORDER — AMIODARONE HCL IN DEXTROSE 360-4.14 MG/200ML-% IV SOLN
30.0000 mg/h | INTRAVENOUS | Status: DC
  Administered 2012-12-06 – 2012-12-07 (×4): 30 mg/h via INTRAVENOUS
  Filled 2012-12-06 (×11): qty 200

## 2012-12-06 MED ORDER — METOPROLOL TARTRATE 25 MG/10 ML ORAL SUSPENSION
25.0000 mg | Freq: Every day | ORAL | Status: DC
  Administered 2012-12-06: 25 mg via ORAL
  Filled 2012-12-06 (×2): qty 10

## 2012-12-06 MED ORDER — FAMOTIDINE IN NACL 20-0.9 MG/50ML-% IV SOLN
20.0000 mg | Freq: Two times a day (BID) | INTRAVENOUS | Status: DC
  Administered 2012-12-06 (×2): 20 mg via INTRAVENOUS
  Filled 2012-12-06 (×4): qty 50

## 2012-12-06 MED ORDER — ASPIRIN 81 MG PO TBEC: 325.0000 mg | DELAYED_RELEASE_TABLET | Freq: Every day | ORAL | Status: DC

## 2012-12-06 MED ORDER — INSULIN ASPART 100 UNIT/ML ~~LOC~~ SOLN
1.0000 [IU] | SUBCUTANEOUS | Status: DC
  Administered 2012-12-06 – 2012-12-08 (×7): 2 [IU] via SUBCUTANEOUS

## 2012-12-06 MED ORDER — ASPIRIN EC 81 MG PO TBEC: 81.0000 mg | DELAYED_RELEASE_TABLET | Freq: Every day | ORAL | Status: DC

## 2012-12-06 MED ORDER — TACROLIMUS 1 MG PO CAPS
3.0000 mg | ORAL_CAPSULE | Freq: Two times a day (BID) | ORAL | Status: DC
  Administered 2012-12-06 – 2012-12-12 (×13): 3 mg via ORAL
  Filled 2012-12-06 (×15): qty 3

## 2012-12-06 MED ORDER — TICAGRELOR 90 MG PO TABS
90.0000 mg | ORAL_TABLET | Freq: Two times a day (BID) | ORAL | Status: DC
  Administered 2012-12-06 – 2012-12-12 (×13): 90 mg via NASOGASTRIC
  Filled 2012-12-06 (×15): qty 1

## 2012-12-06 MED ORDER — SODIUM CHLORIDE 0.9 % IV SOLN
INTRAVENOUS | Status: AC
  Administered 2012-12-06: 02:00:00 via INTRAVENOUS

## 2012-12-06 MED ORDER — ASPIRIN 81 MG PO CHEW
81.0000 mg | CHEWABLE_TABLET | Freq: Every day | ORAL | Status: DC
  Administered 2012-12-06 – 2012-12-12 (×7): 81 mg via ORAL
  Filled 2012-12-06 (×7): qty 1

## 2012-12-06 MED ORDER — METOPROLOL TARTRATE 25 MG PO TABS: 25.0000 mg | ORAL_TABLET | Freq: Every day | ORAL | Status: DC

## 2012-12-06 MED ORDER — EPTIFIBATIDE 75 MG/100ML IV SOLN
1.0000 ug/kg/min | INTRAVENOUS | Status: AC
  Filled 2012-12-06: qty 100

## 2012-12-06 MED ORDER — MIDAZOLAM HCL 2 MG/2ML IJ SOLN: 2.0000 mg | INTRAMUSCULAR | Status: DC | PRN

## 2012-12-06 MED ORDER — ATORVASTATIN CALCIUM 80 MG PO TABS: 80.0000 mg | ORAL_TABLET | Freq: Every day | ORAL | Status: DC

## 2012-12-06 MED ORDER — NOREPINEPHRINE BITARTRATE 1 MG/ML IJ SOLN
2.0000 ug/min | INTRAVENOUS | Status: DC
  Filled 2012-12-06: qty 8

## 2012-12-06 MED ORDER — LIDOCAINE IN D5W 4-5 MG/ML-% IV SOLN
1.0000 mg/min | INTRAVENOUS | Status: DC
  Administered 2012-12-07: 1 mg/min via INTRAVENOUS
  Filled 2012-12-06: qty 500
  Filled 2012-12-06: qty 250

## 2012-12-06 MED ORDER — TICAGRELOR 90 MG PO TABS
180.0000 mg | ORAL_TABLET | Freq: Once | ORAL | Status: AC
  Administered 2012-12-06: 180 mg via NASOGASTRIC
  Filled 2012-12-06: qty 2

## 2012-12-06 MED ORDER — LEVOTHYROXINE SODIUM 125 MCG PO TABS
125.0000 ug | ORAL_TABLET | Freq: Every day | ORAL | Status: DC
  Administered 2012-12-06 – 2012-12-12 (×7): 125 ug via ORAL
  Filled 2012-12-06 (×11): qty 1

## 2012-12-06 MED ORDER — ACETAMINOPHEN 325 MG PO TABS
650.0000 mg | ORAL_TABLET | ORAL | Status: DC | PRN
  Administered 2012-12-07: 650 mg via ORAL
  Filled 2012-12-06: qty 2

## 2012-12-06 MED ORDER — CHLORHEXIDINE GLUCONATE 0.12 % MT SOLN
15.0000 mL | Freq: Two times a day (BID) | OROMUCOSAL | Status: DC
  Administered 2012-12-06 (×2): 15 mL via OROMUCOSAL
  Filled 2012-12-06 (×3): qty 15

## 2012-12-06 MED ORDER — DOPAMINE-DEXTROSE 3.2-5 MG/ML-% IV SOLN
2.0000 ug/kg/min | INTRAVENOUS | Status: DC
  Administered 2012-12-06: 10 ug/kg/min via INTRAVENOUS
  Administered 2012-12-07: 5 ug/kg/min via INTRAVENOUS
  Filled 2012-12-06: qty 250

## 2012-12-06 MED ORDER — SODIUM CHLORIDE 0.9 % IV SOLN: 25.0000 ug/h | INTRAVENOUS | Status: DC

## 2012-12-06 MED ORDER — DOPAMINE-DEXTROSE 3.2-5 MG/ML-% IV SOLN
INTRAVENOUS | Status: AC
  Filled 2012-12-06: qty 250

## 2012-12-06 MED ORDER — ONDANSETRON HCL 4 MG/2ML IJ SOLN
4.0000 mg | Freq: Four times a day (QID) | INTRAMUSCULAR | Status: DC | PRN
  Filled 2012-12-06 (×2): qty 2

## 2012-12-06 MED ORDER — MYCOPHENOLATE SODIUM 180 MG PO TBEC
360.0000 mg | DELAYED_RELEASE_TABLET | Freq: Two times a day (BID) | ORAL | Status: DC
  Administered 2012-12-06 – 2012-12-12 (×13): 360 mg via ORAL
  Filled 2012-12-06 (×16): qty 2

## 2012-12-06 MED ORDER — ASPIRIN 325 MG PO TABS: 325.0000 mg | ORAL_TABLET | Freq: Every day | ORAL | Status: DC

## 2012-12-06 MED ORDER — SODIUM CHLORIDE 0.9 % IV SOLN: INTRAVENOUS | Status: DC

## 2012-12-06 MED ORDER — INSULIN ASPART 100 UNIT/ML ~~LOC~~ SOLN: 1.0000 [IU] | SUBCUTANEOUS | Status: DC

## 2012-12-06 MED ORDER — SODIUM CHLORIDE 0.9 % IV SOLN
INTRAVENOUS | Status: DC
  Administered 2012-12-06: 2.1 [IU]/h via INTRAVENOUS
  Filled 2012-12-06: qty 1

## 2012-12-06 MED ORDER — BIOTENE DRY MOUTH MT LIQD
15.0000 mL | Freq: Four times a day (QID) | OROMUCOSAL | Status: DC
  Administered 2012-12-06 – 2012-12-07 (×5): 15 mL via OROMUCOSAL

## 2012-12-06 MED ORDER — ATORVASTATIN CALCIUM 80 MG PO TABS
80.0000 mg | ORAL_TABLET | Freq: Every day | ORAL | Status: DC
  Administered 2012-12-06 – 2012-12-11 (×6): 80 mg via ORAL
  Filled 2012-12-06 (×7): qty 1

## 2012-12-06 MED ORDER — ONDANSETRON HCL 4 MG/2ML IJ SOLN
4.0000 mg | Freq: Four times a day (QID) | INTRAMUSCULAR | Status: DC | PRN
  Administered 2012-12-06 – 2012-12-07 (×3): 4 mg via INTRAVENOUS
  Filled 2012-12-06: qty 2

## 2012-12-06 MED ORDER — HEPARIN (PORCINE) IN NACL 100-0.45 UNIT/ML-% IJ SOLN
950.0000 [IU]/h | INTRAMUSCULAR | Status: DC
  Administered 2012-12-06: 950 [IU]/h via INTRAVENOUS
  Filled 2012-12-06: qty 250

## 2012-12-06 MED FILL — Dextrose Inj 5%: INTRAVENOUS | Qty: 50 | Status: AC

## 2012-12-06 NOTE — Procedures (Signed)
Central Venous Catheter Insertion Procedure Note Edward Oconnell [id number] [birth year]  Procedure: Insertion of Central Venous Catheter Indications: Assessment of intravascular volume and Drug and/or fluid administration  Procedure Details Consent: Risks of procedure as well as the alternatives and risks of each were explained to the (patient/caregiver).  Consent for procedure obtained. Time Out: Verified patient identification, verified procedure, site/side was marked, verified correct patient position, special equipment/implants available, medications/allergies/relevent history reviewed, required imaging and test results available.  Performed  Maximum sterile technique was used including antiseptics, cap, gloves, gown, hand hygiene, mask and sheet. Skin prep: Chlorhexidine; local anesthetic administered A antimicrobial bonded/coated triple lumen catheter was placed in the left internal jugular vein using the Seldinger technique.  Ultrasound was used to verify the patency of the vein and for real time needle guidance.  Evaluation Blood flow good Complications: No apparent complications Patient did tolerate procedure well. Chest X-ray ordered to verify placement.  CXR: pending.  Edward Oconnell 12/06/2012, 12:05 AM

## 2012-12-06 NOTE — Progress Notes (Signed)
ANTICOAGULATION CONSULT NOTE - FOLLOW-UP    HL = 0.12 (goal 0.2 - 0.5 units/mL) Heparin weight = 81 kg    Assessment: 66 YOF s/p cath to continue on IV heparin.  Noted patient has an IABP.  Heparin level sub-therapeutic.  No complication with infusion, and oozing has stopped post Integrilin discontinued per RN.   Plan: - Increase heparin gtt to 950 units/hr - Check 6 hr HL     Dannae Kato D. Laney Potash, PharmD, BCPS Pager:  [phone] 12/06/2012, 7:06 PM

## 2012-12-06 NOTE — Care Management Note (Signed)
    Page 1 of 1   12/06/2012     10:52:29 AM   CARE MANAGEMENT NOTE 12/06/2012  Patient:  Edward Oconnell, Edward Oconnell   Account Number:  [id number]  Date Initiated:  12/06/2012  Documentation initiated by:  Junius Creamer  Subjective/Objective Assessment:   adm w mi, on vent     Action/Plan:   lives w wife, pcp dr Crawford Givens   Anticipated DC Date:     Anticipated DC Plan:        DC Planning Services  CM consult  Medication Assistance      Choice offered to / List presented to:             Status of service:   Medicare Important Message given?   (If response is "NO", the following Medicare IM given date fields will be blank) Date Medicare IM given:   Date Additional Medicare IM given:    Discharge Disposition:    Per UR Regulation:  Reviewed for med. necessity/level of care/duration of stay  If discussed at Long Length of Stay Meetings, dates discussed:    Comments:  3/27 1049 debbie Noa Galvao rn,bsn pt on vent. left brilinta 30day free and copay assist card in shadow chart.

## 2012-12-06 NOTE — Plan of Care (Signed)
Observed pt's floor wet, asked pt if he spilled water on floor, pt stated no, it may have been his granddaughter when she was giving him some water to drink. This RN observed the floor was also wet around where the IABP was situated. Upon further assessment, saw the heparin bag leaking. At this time, IABP alarming and dampened waveform (straight line observed). Bag switched to NS while awaiting bag from Pharm, IABP flushed with no return of waveform. On call MD notified and at Bedside. 2300 CN notified for assist.Told to try to hang another heparin bag as soon as possible and check the connections.  IABP continues to pump but with no waveform and unable to tell if augmenting. Heparinarized bag  Received from pharm and hung in pressure bag with no change in waveform despite flushing and checking all the connections. 2300 RN re notified for assistance. Pt remains stable, vss. Will cont to monitor closely.

## 2012-12-06 NOTE — Progress Notes (Signed)
ANTICOAGULATION CONSULT NOTE - Initial Consult  Pharmacy Consult for [facility] Indication: IABP s/p STEMI  No Known Allergies  Patient Measurements: Height: 5\' 8"  (172.7 cm) Weight: 178 lb 9.2 oz (81 kg) IBW/kg (Calculated) : 68.4 Heparin Dosing Weight: 81kg  Vital Signs: Temp: 97.9 F (36.6 C) (03/27 0800) Temp src: Oral (03/27 0800) BP: 78/58 mmHg (03/27 1030) Pulse Rate: 57 (03/27 0900)  Labs:  Recent Labs  12/06/12 0044 12/06/12 0647  HGB 10.0* 9.2*  HCT 29.3* 27.2*  PLT 235 244  LABPROT 24.3*  --   INR 2.30*  --   CREATININE 2.30* 2.23*  TROPONINI >20.00* >20.00*    Estimated Creatinine Clearance: 31.5 ml/min (by C-G formula based on Cr of 2.23).   Medical History: Past Medical History  Diagnosis Date  . CAD (coronary artery disease)     nonobstructive  . Hypertension   . Hypothyroidism   . Benign prostatic hypertrophy   . Chronic kidney disease     s/p transplant per [facility]  . Hyperlipidemia   . Diabetes mellitus without complication   . Anginal pain   . Stroke 2009  . GERD (gastroesophageal reflux disease)   . Cancer     skin cancer  . History of shingles 08/2012    Lt eye shingles    Medications:  Scheduled:  . [COMPLETED] amiodarone      . [COMPLETED] amiodarone (NEXTERONE PREMIX) 360 mg/200 mL dextrose  60 mg/hr Intravenous STAT  . antiseptic oral rinse  15 mL Mouth Rinse QID  . aspirin  81 mg Oral Daily  . atorvastatin  80 mg Oral q1800  . [COMPLETED] atropine      . [COMPLETED] bivalirudin      . chlorhexidine  15 mL Mouth Rinse BID  . [COMPLETED] eptifibatide      . famotidine (PEPCID) IV  20 mg Intravenous Q12H  . [COMPLETED] fentaNYL      . [COMPLETED] heparin      . levothyroxine  125 mcg Oral QAC breakfast  . [COMPLETED] lidocaine (PF)      . metoprolol tartrate  25 mg Oral Daily  . [COMPLETED] midazolam      . mycophenolate  360 mg Oral BID  . pantoprazole sodium  40 mg Per Tube Q1200  . [COMPLETED] sodium bicarbonate      .  tacrolimus  3 mg Oral BID  . [COMPLETED] THROMBI-PAD  1 each Topical Once  . [COMPLETED] Ticagrelor  180 mg Per NG tube Once  . Ticagrelor  90 mg Per NG tube BID  . [DISCONTINUED] aspirin  325 mg Oral Daily  . [DISCONTINUED] aspirin EC  81 mg Oral Daily  . [DISCONTINUED] aspirin  325 mg Oral Daily  . [DISCONTINUED] atorvastatin  80 mg Oral q1800  . [DISCONTINUED] eptifibatide  180 mcg/kg Intravenous Once  . [DISCONTINUED] fentaNYL infusion INTRAVENOUS  25-300 mcg/hr Intravenous STAT  . [DISCONTINUED] insulin aspart  1-3 Units Subcutaneous Q4H  . [DISCONTINUED] metoprolol tartrate  25 mg Oral Daily  . [DISCONTINUED] metoprolol tartrate  25 mg Per NG tube Daily  . [DISCONTINUED] midazolam (VERSED) infusion  1-10 mg/hr Intravenous STAT   Infusions:  . sodium chloride 100 mL/hr at 12/06/12 0131  . amiodarone (NEXTERONE PREMIX) 360 mg/200 mL dextrose 30 mg/hr (12/06/12 0824)  . eptifibatide Stopped (12/06/12 0723)  . fentaNYL infusion INTRAVENOUS 50 mcg/hr (12/06/12 0121)  . insulin (NOVOLIN-R) infusion 5.3 mL/hr at 12/06/12 0800  . [DISCONTINUED] eptifibatide 1 mcg/kg/min (12/06/12 0100)  . [DISCONTINUED] lidocaine 1  mg/min (12/06/12 0100)    Assessment: 67 y/o male patient admitted with STEMI, now s/p cath with DES x1 to proximal LAD. IABP was placed d/t cardiac instability along with Integrillin. Integrillin has now been d/c'd and heparin gtt is ordered for anticoagulation. Will begin with no bolus and lower range goal.  Goal of Therapy:  Heparin level 0.2-0.5 units/ml Monitor platelets by anticoagulation protocol: Yes   Plan:  Begin heparin gtt at 800 units/hr and check 6 hour heparin level with daily cbc and heparin level.  Verlene Mayer, PharmD, BCPS Pager [phone] 12/06/2012,11:27 AM

## 2012-12-06 NOTE — Procedures (Signed)
Extubation Procedure Note  Patient Details:   Name: KALMEN LOLLAR DOB: 1946/01/15 MRN: [id number]   Airway Documentation:     Evaluation  O2 sats: stable throughout Complications: No apparent complications Patient did tolerate procedure well. Bilateral Breath Sounds: Clear Suctioning: Airway Yes  Devra Dopp D 12/06/2012, 1:56 PM

## 2012-12-06 NOTE — Progress Notes (Signed)
Cardiology Cross Cover note Called by nursing this evening due to loss of arterial waveform on IABP. Apparently, the pressure bag had a hidden leak and failed to continue a constant drip to the arterial line. Likely clotted. Insertation site without obvious kinks or abnormalities. Distal pulses intact. Otherwise, the IABP triggers normally on the EKG without alarms. Patient still on dopamine at 8, amiodarone and lidocaine and heparin gtt. Alert and without pain. Per nursing, earlier today when 1:2 wean attempted he had increased ventricular ectopy. Plan to continue IABP at this time and consider removal (or exchange) in the AM. Spoke with interventionalist on call who agrees.

## 2012-12-06 NOTE — H&P (Signed)
PULMONARY  / CRITICAL CARE MEDICINE  Name: Edward Oconnell MRN: [id number] DOB: 28-Oct-1945    ADMISSION DATE:  12/05/2012 CONSULTATION DATE:  12/05/12  REFERRING MD :  Clifton James PRIMARY SERVICE: Cardiology  CHIEF COMPLAINT:  STEMI  BRIEF PATIENT DESCRIPTION: 67 y/o male with CKD s/p renal transplant presented to the [facility] ED on 3/26 with chest pain, found to have a STEMI. In the cath lab had multiple episodes of VFib which were all defibrillated quickly without CPR.  Intubated in setting of recurrent VFIB.  SIGNIFICANT EVENTS / STUDIES:  3/26 LHC> PCI prox LAD; prob thrombus RCA  LINES / TUBES: 3/26 ETT >> 3/26 L IJ CVL >> 3/26 balloon pump >> 3/26 temp pacer R fem >>  CULTURES:   ANTIBIOTICS:   HISTORY OF PRESENT ILLNESS:  66 y/o male with CKD s/p renal transplant presented to the [facility] ED on 3/26 with chest pain, found to have a STEMI. In the cath lab had multiple episodes of VFib which were all defibrillated quickly without CPR.  Intubated in setting of recurrent VFIB.  He was unable to provide history because he was intubated.    PAST MEDICAL HISTORY :  Past Medical History  Diagnosis Date  . CAD (coronary artery disease)     nonobstructive  . Hypertension   . Hypothyroidism   . Benign prostatic hypertrophy   . Chronic kidney disease     s/p transplant per [facility]  . Hyperlipidemia   . Stroke   . Diabetes mellitus without complication    Past Surgical History  Procedure Laterality Date  . Kidney transplant      09/2010   Prior to Admission medications   Medication Sig Start Date End Date Taking? Authorizing Provider  aspirin 81 MG EC tablet Take 81 mg by mouth daily.      Historical Provider, MD  atorvastatin (LIPITOR) 40 MG tablet TAKE ONE BY MOUTH MONDAY, [facility] AND FRIDAY 07/25/12   Dianne Dun, MD  Ferrous Gluconate (IRON) 240 (27 FE) MG TABS Take by mouth daily.      Historical Provider, MD  furosemide (LASIX) 40 MG tablet Take 1 tablet by mouth Mon, Wed and Fri     Historical Provider, MD  hydrALAZINE (APRESOLINE) 50 MG tablet Take 50 mg by mouth 4 (four) times daily.     Historical Provider, MD  levothyroxine (SYNTHROID, LEVOTHROID) 125 MCG tablet Take 125 mcg by mouth daily.    Historical Provider, MD  losartan (COZAAR) 25 MG tablet Take 25 mg by mouth 2 (two) times daily.      Historical Provider, MD  Magnesium Chloride (SLOW-MAG PO) Take by mouth 2 (two) times daily.     Historical Provider, MD  metoprolol tartrate (LOPRESSOR) 25 MG tablet Take 25 mg by mouth daily.     Historical Provider, MD  mycophenolate (MYFORTIC) 180 MG EC tablet Taking 2 tabs in Am and 2 tab in PM    Historical Provider, MD  ondansetron (ZOFRAN-ODT) 4 MG disintegrating tablet TAKE 1 TABLET BY MOUTH 3 TIMES A DAY AS NEEDED FOR NAUSEA 07/14/12   Joaquim Nam, MD  ranitidine (ZANTAC) 150 MG tablet Take 150 mg by mouth daily.    Historical Provider, MD  sertraline (ZOLOFT) 100 MG tablet Take 100 mg by mouth daily.  09/18/11   Joaquim Nam, MD  sodium bicarbonate 650 MG tablet Take 650 mg by mouth 3 (three) times daily.     Historical Provider, MD  tacrolimus (PROGRAF) 1 MG  capsule Take 3 by mouth in AM and 3 tabs at bedtime.    Historical Provider, MD  valACYclovir (VALTREX) 1000 MG tablet Take 1,000 mg by mouth daily. 06/14/12   Eustaquio Boyden, MD   No Known Allergies  FAMILY HISTORY:  Family History  Problem Relation Age of Onset  . Kidney failure Mother     was on dialysis 3 times a week when she died  . Alcohol abuse Mother   . Hypertension Father   . Heart attack Sister   . Kidney disease Sister   . Aneurysm Sister   . COPD Sister   . Colon cancer Neg Hx   . Prostate cancer Neg Hx    SOCIAL HISTORY:  reports that he has never smoked. He has never used smokeless tobacco. He reports that he does not drink alcohol or use illicit drugs.  REVIEW OF SYSTEMS:  Could not obtain because intubated  SUBJECTIVE:   VITAL SIGNS: Pulse Rate:  [130] 130 (03/26  2059) Weight:  [83 kg (182 lb 15.7 oz)] 83 kg (182 lb 15.7 oz) (03/26 2100) HEMODYNAMICS:   VENTILATOR SETTINGS:   INTAKE / OUTPUT: Intake/Output   None     PHYSICAL EXAMINATION:  Gen: comfortable on vent HEENT: NCAT, PERRL, EOMi, ETT in place PULM: CTA B CV: Balloon pump, S1/S2 sound normal AB: BS+, soft, nontender, no hsm Ext: warm, no edema, no clubbing, no cyanosis Derm: no rash or skin breakdown Neuro: Awake and alert, following commands   LABS: No results found for this basename: HGB, WBC, PLT, NA, K, CL, CO2, GLUCOSE, BUN, CREATININE, CALCIUM, MG, PHOS, AST, ALT, ALKPHOS, BILITOT, PROT, ALBUMIN, APTT, INR, LATICACIDVEN, TROPONINI, PROCALCITON, PROBNP, O2SATVEN, PHART, PCO2ART, PO2ART,  in the last 168 hours No results found for this basename: GLUCAP,  in the last 168 hours  3/26 CXR >   ASSESSMENT / PLAN:  PULMONARY A:  Acute respiratory failure in setting of recurrent VFib; mostly intubated so he could receive sedation  P:   -full vent support -ABG/CXR -likely extubation 3/27  CARDIOVASCULAR A: STEMI, s/p PCI Recurrent v-fib now stable post PCI, lidocaine, balloon pump P:  -per cardiology  RENAL A:  CKD s/p renal transplant; baseline Cr 2.8; high risk for AKI considering LHC P:   -monitor BMET/UOP -NS 100cc/hour 3/26-3/27  -continue tacrolimus and cellcept; watch renal function carefully on tacrolimus -Renal consult 3/27  GASTROINTESTINAL A:  No acute issues P:   -pepcid for stress ulcer prophylaxis  HEMATOLOGIC A:  No acute issues P:    INFECTIOUS A:  No acute issues P:     ENDOCRINE A:  DM2 P:   -ICU hyperglycemia protocol sensitive  NEUROLOGIC A:  Awake and alert, following commands post multiple shocks so no indication for hypothermia P:   -fentanyl/versed titrated to RASS 0 overnight  TODAY'S SUMMARY:   I have personally obtained a history, examined the patient, evaluated laboratory and imaging results, formulated the  assessment and plan and placed orders. CRITICAL CARE: The patient is critically ill with multiple organ systems failure and requires high complexity decision making for assessment and support, frequent evaluation and titration of therapies, application of advanced monitoring technologies and extensive interpretation of multiple databases. Critical Care Time devoted to patient care services described in this note is 60 minutes.   Fonnie Jarvis Pulmonary and Critical Care Medicine [facility] Pager: [phone]  12/06/2012, 12:10 AM

## 2012-12-06 NOTE — Progress Notes (Signed)
Pt started having more NSVT - also was having some atrial tachycardia which I think was PAF.  Both have improved with restarting Lidocaine.  BP has trended downward . I think we should keep IABP in for another several hours.  Will measure CVP and bolus with NS if he is dry.  I think we will need to restart heparin with the IABP in ( off Integrelin for now) despite the fact that he is oozing from central line and cath site.  Will follow   Alvia Grove., MD, [facility] 12/06/2012, 11:08 AM Office - [phone] Pager [phone]

## 2012-12-06 NOTE — Progress Notes (Addendum)
    SUBJECTIVE: Sedated, intubated.   BP 119/80  Pulse 66  Temp(Src) 97.6 F (36.4 C) (Oral)  Resp 15  Ht 5\' 8"  (1.727 m)  Wt 178 lb 9.2 oz (81 kg)  BMI 27.16 kg/m2  SpO2 100%  Intake/Output Summary (Last 24 hours) at 12/06/12 0706 Last data filed at 12/06/12 0600  Gross per 24 hour  Intake 853.35 ml  Output    375 ml  Net 478.35 ml    PHYSICAL EXAM General: Intubated, sedated.   Psych:  sedated Neck: No JVD. No masses noted.  Lungs: Mechanical breath sounds  Heart: RRR with rub and systolic murmur noted.  Abdomen: Bowel sounds are present. Soft, non-tender.  Extremities: No lower extremity edema.   LABS: Basic Metabolic Panel:  Recent Labs  96/04/54 0044  NA 136  K 4.1  CL 102  CO2 22  GLUCOSE 281*  BUN 30*  CREATININE 2.30*  CALCIUM 7.9*  MG 1.5   CBC:  Recent Labs  12/06/12 0044  WBC 12.0*  NEUTROABS 10.6*  HGB 10.0*  HCT 29.3*  MCV 91.3  PLT 235   Cardiac Enzymes:  Recent Labs  12/06/12 0044  TROPONINI >20.00*   Fasting Lipid Panel: No results found for this basename: CHOL, HDL, LDLCALC, TRIG, CHOLHDL, LDLDIRECT,  in the last 72 hours  Current Meds: . antiseptic oral rinse  15 mL Mouth Rinse QID  . aspirin  81 mg Oral Daily  . atorvastatin  80 mg Oral q1800  . chlorhexidine  15 mL Mouth Rinse BID  . eptifibatide  180 mcg/kg Intravenous Once  . famotidine (PEPCID) IV  20 mg Intravenous Q12H  . levothyroxine  125 mcg Oral QAC breakfast  . metoprolol tartrate  25 mg Oral Daily  . mycophenolate  360 mg Oral BID  . pantoprazole sodium  40 mg Per Tube Q1200  . tacrolimus  3 mg Oral BID  . Ticagrelor  90 mg Per NG tube BID     ASSESSMENT AND PLAN: 67 yo male with history of DM, HTN, HLD, chronic kidney disease s/p kidney transplant, CAD admitted with anterior STEMI complicated by incessant VF during cath evening of 12/05/12.   1. Anterior STEMI complicated by incessant VF and cardiogenic shock: s/p DES x 1 proximal LAD. Small  non-dominant RCA was occluded at beginning of case and reperfused with medical therapy during case. He is currently hemodynamically stable with IABP at 1:1. He is on amiodarone and lidocaine drips. Some ectopy with one 5 beat run VT this am. He is currently on ASA and Brilinta for dual anti-platelet therapy. He is on a beta blocker and statin. Integrilin has been used overnight with inability to initially load oral anti-platelets.   - Will change IABP to 2:1 with plans to remove this afternoon if hemodynamically stable - Will d/c lidocaine drip - Continue amiodarone drip - Continue ASA/Brilinta/statin/beta blocker. No Ace-inh with chronic kidney disease - D/C Integrilin drip - Will wait to remove temporary pacemaker this afternoon if stable.  2. Chronic kidney disease, stage III s/p kidney transplant: Very likely that he will have some degree of worsened renal function after large contrast dye load used during complex cardiac catheterization last night. Will follow closely. Continue tacrolimus.   3. DM: SSI for now  4. Respiratory failure: Intubated for airway protection during code. Appreciate PCCM assistance. Hopefully can extubate this am.     Edward Oconnell,Edward Oconnell  3/27/20147:06 AM

## 2012-12-06 NOTE — Progress Notes (Signed)
Called to cath lab at 2010  for a new intubated patient. Placed pt on PRVC 550 14 +5 100% during procedure. Patient transported to CCU room 2912 at 2345 on vent without issue.

## 2012-12-06 NOTE — Progress Notes (Signed)
EKG rhythm on monitor showing pacer spikes on QRS and ST randomly. Pt HR is 70-75, pacemaker set at rate of 60. Oncall MD notified and made aware. Per MD rate decreased to 40.

## 2012-12-06 NOTE — Progress Notes (Signed)
CRITICAL VALUE ALERT  Critical value received:  Troponin >20  Date of notification:  12/06/2012  Time of notification:  0200  Critical value read back:yes  Nurse who received alert:  Sunday Corn Cartner  Pt was STEMI and has already been to the cath lab and defibrillated multiple times, value is expected.

## 2012-12-06 NOTE — Progress Notes (Signed)
  Echocardiogram 2D Echocardiogram has been performed.  Edward Oconnell 12/06/2012, 3:09 PM

## 2012-12-06 NOTE — Progress Notes (Addendum)
Pt was hypotensive with IABP at 2:1. IABP changed to 1:1 and dopamine drip started. Now MAP 70.  Will add Levophed drip if needed this afternoon. His HR is currently 80 bpm. Transvenous pacemaker not capturing. Pacemaker turned off. Family updated in waiting room on his status. Pt is breathing on his own. D/C PCCM. Will extubate.   Kenniel Bergsma 1:33 PM 12/06/2012

## 2012-12-06 NOTE — Progress Notes (Signed)
Patient continues to have hypotension.    CVP measurements getting set up.  Echo - prelim LVEF is 40%.    Will start dopamine 5 mcg / kg/min . Titrate to SBP> 90.  Critical care time today 35 minutes.   Edward Oconnell, [name], MD, [facility] 12/06/2012, 12:47 PM Office - [phone] Pager [phone]

## 2012-12-07 ENCOUNTER — Inpatient Hospital Stay (HOSPITAL_COMMUNITY): Payer: Medicare Other

## 2012-12-07 DIAGNOSIS — J9600 Acute respiratory failure, unspecified whether with hypoxia or hypercapnia: Secondary | ICD-10-CM

## 2012-12-07 DIAGNOSIS — I4901 Ventricular fibrillation: Secondary | ICD-10-CM

## 2012-12-07 LAB — BASIC METABOLIC PANEL
Calcium: 8.5 mg/dL (ref 8.4–10.5)
Creatinine, Ser: 2.75 mg/dL — ABNORMAL HIGH (ref 0.50–1.35)
GFR calc Af Amer: 26 mL/min — ABNORMAL LOW (ref >90)
GFR calc non Af Amer: 22 mL/min — ABNORMAL LOW (ref >90)

## 2012-12-07 LAB — CBC
Platelets: 206 10*3/uL (ref 150–400)
RDW: 14 % (ref 11.5–15.5)
WBC: 12.2 10*3/uL — ABNORMAL HIGH (ref 4.0–10.5)

## 2012-12-07 LAB — HEPARIN LEVEL (UNFRACTIONATED): Heparin Unfractionated: 0.32 IU/mL (ref 0.30–0.70)

## 2012-12-07 MED ORDER — FAMOTIDINE 20 MG PO TABS
20.0000 mg | ORAL_TABLET | Freq: Every day | ORAL | Status: DC
  Administered 2012-12-07 – 2012-12-12 (×6): 20 mg via ORAL
  Filled 2012-12-07 (×6): qty 1

## 2012-12-07 NOTE — Progress Notes (Signed)
100cc of fentanyl (drip) wasted in sink by Luther Hearing, Charity fundraiser and witnessed by [place] Brody, RN.

## 2012-12-07 NOTE — Progress Notes (Signed)
Versed 30 ml wasted in sink by Rocco Pauls RN & [facility] RN

## 2012-12-07 NOTE — Progress Notes (Signed)
ANTICOAGULATION CONSULT NOTE - FOLLOW-UP    HL = 0.32 (goal 0.2 - 0.5 units/mL) Heparin weight = 81 kg    Assessment: 66 YOF s/p cath to continue on IV heparin.  Noted patient has an IABP.  Heparin level therapeutic.    Plan: - Continue heparin gtt at 950 units/hr - Check 6 hr HL to confirm     Talbert Cage, PharmD Pager:  [phone] 12/07/2012, 2:31 AM

## 2012-12-07 NOTE — Progress Notes (Signed)
PULMONARY  / CRITICAL CARE MEDICINE  Name: Edward Oconnell MRN: [id number] DOB: Jan 10, 1946    ADMISSION DATE:  12/05/2012 CONSULTATION DATE:  12/05/12  REFERRING MD :  Clifton James PRIMARY SERVICE: Cardiology  CHIEF COMPLAINT:  STEMI  BRIEF PATIENT DESCRIPTION: 67 y/o male with CKD s/p renal transplant presented to the [facility] ED on 3/26 with chest pain, found to have a STEMI. In the cath lab had multiple episodes of VFib which were all defibrillated quickly without CPR.  Intubated in setting of recurrent VFIB.  SIGNIFICANT EVENTS / STUDIES:  3/26 LHC> PCI prox LAD; prob thrombus RCA  LINES / TUBES: 3/26 ETT >> 3/27 3/26 L IJ CVL >> 3/26 balloon pump >> 3/28 3/26 temp pacer R fem >> 3/28  CULTURES:   ANTIBIOTICS:   HISTORY OF PRESENT ILLNESS:  67 y/o male with CKD s/p renal transplant presented to the [facility] ED on 3/26 with chest pain, found to have a STEMI. In the cath lab had multiple episodes of VFib which were all defibrillated quickly without CPR.  Intubated in setting of recurrent VFIB.  He was unable to provide history because he was intubated.    SUBJECTIVE:  Extubated successfully 3/27  VITAL SIGNS: Temp:  [97.7 F (36.5 C)-98.8 F (37.1 C)] 97.7 F (36.5 C) (03/28 1136) Pulse Rate:  [55-159] 62 (03/28 1136) Resp:  [17-26] 23 (03/28 1000) BP: (96-162)/(63-98) 147/80 mmHg (03/28 1130) SpO2:  [97 %-100 %] 100 % (03/28 1136) FiO2 (%):  [40 %] 40 % (03/27 1304) Weight:  [80 kg (176 lb 5.9 oz)-83.4 kg (183 lb 13.8 oz)] 83.4 kg (183 lb 13.8 oz) (03/28 0500) HEMODYNAMICS: CVP:  [10 mmHg-17 mmHg] 16 mmHg VENTILATOR SETTINGS: Vent Mode:  [-] PSV;CPAP FiO2 (%):  [40 %] 40 % PEEP:  [5 cmH20] 5 cmH20 Pressure Support:  [5 cmH20] 5 cmH20 INTAKE / OUTPUT: Intake/Output     03/27 0701 - 03/28 0700 03/28 0701 - 03/29 0700   P.O.  220   I.V. (mL/kg) 2726.6 (32.7) 132.9 (1.6)   NG/GT     IV Piggyback 50    Total Intake(mL/kg) 2776.6 (33.3) 352.9 (4.2)   Urine (mL/kg/hr) 592 (0.3) 80  (0.2)   Emesis/NG output 200 (0.1)    Total Output 792 80   Net +1984.6 +272.9          PHYSICAL EXAMINATION:  Gen: comfortabl HEENT: NCAT, PERRL, EOMi, PULM: CTA B CV: S1/S2 sound normal AB: BS+, soft, nontender, no hsm Ext: warm, no edema, no clubbing, no cyanosis Derm: no rash or skin breakdown Neuro: Awake and alert, following commands   LABS:  Recent Labs Lab 12/05/12 2140  12/06/12 0044 12/06/12 0216 12/06/12 0630 12/06/12 0647 12/06/12 1703 12/07/12 0200  HGB  --   --  10.0*  --   --  9.2* 10.2* 9.4*  WBC  --   < > 12.0*  --   --  10.0 13.7* 12.2*  PLT  --   < > 235  --   --  244 262 206  NA  --   --  136  --   --  140  --  138  K  --   --  4.1  --   --  3.9  --  4.5  CL  --   --  102  --   --  108  --  105  CO2  --   --  22  --   --  20  --  20  GLUCOSE  --   --  281*  --   --  180*  --  177*  BUN  --   --  30*  --   --  29*  --  33*  CREATININE  --   --  2.30*  --   --  2.23*  --  2.75*  CALCIUM  --   --  7.9*  --   --  7.7*  --  8.5  MG  --   --  1.5  --   --   --   --   --   AST  --   --  102*  --   --  92*  --   --   ALT  --   --  68*  --   --  59*  --   --   ALKPHOS  --   --  116  --   --  97  --   --   BILITOT  --   --  0.5  --   --  0.3  --   --   PROT  --   --  5.3*  --   --  5.1*  --   --   ALBUMIN  --   --  3.1*  --   --  2.8*  --   --   INR  --   --  2.30*  --   --   --   --   --   LATICACIDVEN  --   --   --   --  2.5*  --   --   --   TROPONINI  --   --  >20.00*  --   --  >20.00* >20.00*  --   PHART 7.169*  --   --  7.340*  --   --   --   --   PCO2ART 41.6  --   --  43.3  --   --   --   --   PO2ART 465.0*  --   --  502.0*  --   --   --   --   < > = values in this interval not displayed.  Recent Labs Lab 12/06/12 1712 12/06/12 1943 12/06/12 2339 12/07/12 0346 12/07/12 0843  GLUCAP 181* 191* 174* 174* 156*    3/26 CXR >   ASSESSMENT / PLAN:  PULMONARY A:  Acute respiratory failure in setting of recurrent VFib; extubated 3/27 P:    -pulm hygiene  CARDIOVASCULAR A: STEMI, s/p PCI Recurrent v-fib now stable post PCI, lidocaine, balloon pump P:  -Plans as per Dr Clifton James  RENAL A:  CKD s/p renal transplant; baseline Cr 2.8; high risk for AKI considering LHC, dye load P:   -monitor BMET/UOP -continue tacrolimus and cellcept; watch renal function carefully on tacrolimus  GASTROINTESTINAL A:  No acute issues P:   -pepcid for stress ulcer prophylaxis  HEMATOLOGIC A:  No acute issues P:    INFECTIOUS A:  No acute issues P:     ENDOCRINE A:  DM2 P:   -ICU hyperglycemia protocol, sensitive > convert once taking PO  NEUROLOGIC A:  Awake and alert, was following commands post multiple shocks so no indication for hypothermia P:     I have personally obtained a history, examined the patient, evaluated laboratory and imaging results, formulated the assessment and plan and placed orders.  PCCM will sign off. Please call if we can help in any  way  Levy Pupa, MD, PhD 12/07/2012, 12:32 PM [facility] [phone] or if no answer [phone]

## 2012-12-07 NOTE — Progress Notes (Signed)
SUBJECTIVE: c/o nausea. No chest pain or SOB. No abdominal pain.   BP 117/81  Pulse 64  Temp(Src) 98.7 F (37.1 C) (Oral)  Resp 19  Ht 5\' 8"  (1.727 m)  Wt 183 lb 13.8 oz (83.4 kg)  BMI 27.96 kg/m2  SpO2 99%  Intake/Output Summary (Last 24 hours) at 12/07/12 0719 Last data filed at 12/07/12 0700  Gross per 24 hour  Intake 3026.62 ml  Output    742 ml  Net 2284.62 ml    PHYSICAL EXAM General: Well developed, well nourished, in no acute distress. Alert and oriented x 3.  Psych:  Good affect, responds appropriately Neck: No JVD. No masses noted.  Lungs: Clear bilaterally with no wheezes or rhonci noted.  Heart: Huston Foley with no murmurs noted. Abdomen: Bowel sounds are present. Soft, non-tender.  Extremities: No lower extremity edema.   LABS: Basic Metabolic Panel:  Recent Labs  16/10/96 0044 12/06/12 0647 12/07/12 0200  NA 136 140 138  K 4.1 3.9 4.5  CL 102 108 105  CO2 22 20 20   GLUCOSE 281* 180* 177*  BUN 30* 29* 33*  CREATININE 2.30* 2.23* 2.75*  CALCIUM 7.9* 7.7* 8.5  MG 1.5  --   --    CBC:  Recent Labs  12/06/12 0044  12/06/12 1703 12/07/12 0200  WBC 12.0*  < > 13.7* 12.2*  NEUTROABS 10.6*  --   --   --   HGB 10.0*  < > 10.2* 9.4*  HCT 29.3*  < > 30.0* 27.4*  MCV 91.3  < > 91.5 91.0  PLT 235  < > 262 206  < > = values in this interval not displayed. Cardiac Enzymes:  Recent Labs  12/06/12 0044 12/06/12 0647 12/06/12 1703  TROPONINI >20.00* >20.00* >20.00*   Fasting Lipid Panel:  Recent Labs  12/06/12 0647  CHOL 114  HDL 23*  LDLCALC 46  TRIG 045*  CHOLHDL 5.0    Current Meds: . antiseptic oral rinse  15 mL Mouth Rinse QID  . aspirin  81 mg Oral Daily  . atorvastatin  80 mg Oral q1800  . famotidine (PEPCID) IV  20 mg Intravenous Q12H  . insulin aspart  1-3 Units Subcutaneous Q4H  . levothyroxine  125 mcg Oral QAC breakfast  . metoprolol tartrate  25 mg Oral Daily  . mycophenolate  360 mg Oral BID  . pantoprazole sodium  40  mg Per Tube Q1200  . tacrolimus  3 mg Oral BID  . Ticagrelor  90 mg Per NG tube BID   Echo 12/06/12: Left ventricle: The cavity size was normal. Wall thickness was increased in a pattern of mild LVH. Systolic function was normal. The estimated ejection fraction was in the range of 60% to 65%. Wall motion was normal; there were no regional wall motion abnormalities. - Right ventricle: The cavity size was mildly dilated. Systolic function was mildly reduced.  ASSESSMENT AND PLAN: 67 yo male with history of DM, HTN, HLD, chronic kidney disease s/p kidney transplant, moderately severe CAD by cath 2004 admitted with anterior STEMI complicated by incessant VF during cath evening of 12/05/12.   1. Anterior STEMI complicated by incessant VF and cardiogenic shock: s/p DES x 1 proximal LAD. Small non-dominant RCA was occluded at beginning of case and reperfused with medical therapy during case. Echo yesterday with preserved LV systolic function. He is currently hemodynamically stable on dopamine drip. Heparin bag leaked last night and IABP waveform is abnormal. Will remove IABP this  am. He is on amiodarone and lidocaine drips currently. He is currently on ASA and Brilinta for dual anti-platelet therapy. He is on a beta blocker and statin.   - Will remove IABP this am.   - Will d/c lidocaine drip at noon after IABP is removed - Continue amiodarone drip today. May be able to switch to po amiodarone tomorrow if no arrythmias. - Continue ASA/Brilinta/statin. No Ace-inh with chronic kidney disease  - D/C beta blocker with bradycardia and hypotension. Restart this weekend if stable. - Remove temporary pacemaker this am.    2. Chronic kidney disease, stage III s/p kidney transplant: Stable post cath. His baseline creatinine is 2.8. Very likely that he will have some degree of worsened renal function after large contrast dye load used during complex cardiac catheterization but stable for now. Will follow  closely. Continue transplant medications. Will consult Nephrology team if any evidence of worsened renal function.    3. DM: SSI for now   4. Respiratory failure: Extubated yesterday. Airway is stable.    5. Nausea: Advance diet. Likely due to amiodarone. Abdomen not tender to palpation. Continue Zofran.   Edward Oconnell  3/28/20147:19 AM

## 2012-12-07 NOTE — Progress Notes (Signed)
Right groin bruising present prior to IABP removal. Bruising from inner thigh to hip present. IABP removed from rfa, unable to aspirate. Manual pressure applied after bleedback. Manual pressure held for 30 minutes BP 120/73 Hr 79. No S+S of additional brusing, hemostasis achieved. Dressing applied, bedrest instructions given. Distal pulses, bilateral DP palpable.

## 2012-12-08 ENCOUNTER — Encounter (HOSPITAL_COMMUNITY): Payer: Self-pay | Admitting: Nephrology

## 2012-12-08 DIAGNOSIS — N184 Chronic kidney disease, stage 4 (severe): Secondary | ICD-10-CM

## 2012-12-08 DIAGNOSIS — Z940 Kidney transplant status: Secondary | ICD-10-CM

## 2012-12-08 HISTORY — DX: Kidney transplant status: Z94.0

## 2012-12-08 HISTORY — DX: Chronic kidney disease, stage 4 (severe): N18.4

## 2012-12-08 LAB — URINALYSIS, ROUTINE W REFLEX MICROSCOPIC
Nitrite: NEGATIVE
Protein, ur: NEGATIVE mg/dL
Specific Gravity, Urine: 1.015 (ref 1.005–1.030)
Urobilinogen, UA: 0.2 mg/dL (ref 0.0–1.0)

## 2012-12-08 LAB — CBC
HCT: 21.7 % — ABNORMAL LOW (ref 39.0–52.0)
Hemoglobin: 7.6 g/dL — ABNORMAL LOW (ref 13.0–17.0)
MCH: 31.3 pg (ref 26.0–34.0)
MCHC: 35 g/dL (ref 30.0–36.0)
MCV: 89.3 fL (ref 78.0–100.0)

## 2012-12-08 LAB — BASIC METABOLIC PANEL
BUN: 40 mg/dL — ABNORMAL HIGH (ref 6–23)
CO2: 20 mEq/L (ref 19–32)
Chloride: 107 mEq/L (ref 96–112)
Glucose, Bld: 116 mg/dL — ABNORMAL HIGH (ref 70–99)
Potassium: 3.9 mEq/L (ref 3.5–5.1)

## 2012-12-08 LAB — SODIUM, URINE, RANDOM: Sodium, Ur: 33 mEq/L

## 2012-12-08 LAB — GLUCOSE, CAPILLARY
Glucose-Capillary: 122 mg/dL — ABNORMAL HIGH (ref 70–99)
Glucose-Capillary: 131 mg/dL — ABNORMAL HIGH (ref 70–99)

## 2012-12-08 MED ORDER — AMLODIPINE BESYLATE 2.5 MG PO TABS
2.5000 mg | ORAL_TABLET | Freq: Every day | ORAL | Status: DC
  Administered 2012-12-08 – 2012-12-11 (×4): 2.5 mg via ORAL
  Filled 2012-12-08 (×5): qty 1

## 2012-12-08 MED ORDER — HYDRALAZINE HCL 25 MG PO TABS
25.0000 mg | ORAL_TABLET | Freq: Four times a day (QID) | ORAL | Status: DC
  Administered 2012-12-08 (×3): 25 mg via ORAL
  Filled 2012-12-08 (×8): qty 1

## 2012-12-08 MED ORDER — AMIODARONE HCL 200 MG PO TABS
400.0000 mg | ORAL_TABLET | Freq: Two times a day (BID) | ORAL | Status: DC
  Administered 2012-12-08 – 2012-12-11 (×8): 400 mg via ORAL
  Filled 2012-12-08 (×11): qty 2

## 2012-12-08 MED ORDER — HYDRALAZINE HCL 20 MG/ML IJ SOLN
10.0000 mg | INTRAMUSCULAR | Status: DC | PRN
  Administered 2012-12-09 (×2): 10 mg via INTRAVENOUS
  Filled 2012-12-08 (×2): qty 1

## 2012-12-08 MED ORDER — HYDRALAZINE HCL 20 MG/ML IJ SOLN
INTRAMUSCULAR | Status: AC
  Administered 2012-12-08: 10 mg via INTRAVENOUS
  Filled 2012-12-08: qty 1

## 2012-12-08 MED ORDER — METOPROLOL TARTRATE 25 MG PO TABS: 25.0000 mg | ORAL_TABLET | Freq: Once | ORAL | Status: AC

## 2012-12-08 MED ORDER — METOPROLOL TARTRATE 25 MG PO TABS: 25.0000 mg | ORAL_TABLET | Freq: Two times a day (BID) | ORAL | Status: DC

## 2012-12-08 MED ORDER — HYDRALAZINE HCL 25 MG PO TABS
25.0000 mg | ORAL_TABLET | Freq: Four times a day (QID) | ORAL | Status: DC
  Filled 2012-12-08 (×2): qty 1

## 2012-12-08 MED ORDER — INSULIN ASPART 100 UNIT/ML ~~LOC~~ SOLN
1.0000 [IU] | Freq: Three times a day (TID) | SUBCUTANEOUS | Status: DC
  Administered 2012-12-08: 1 [IU] via SUBCUTANEOUS
  Administered 2012-12-08 – 2012-12-09 (×2): 2 [IU] via SUBCUTANEOUS
  Administered 2012-12-10: 1 [IU] via SUBCUTANEOUS

## 2012-12-08 MED ORDER — METOPROLOL TARTRATE 25 MG PO TABS
25.0000 mg | ORAL_TABLET | Freq: Two times a day (BID) | ORAL | Status: DC
  Administered 2012-12-08 – 2012-12-12 (×8): 25 mg via ORAL
  Filled 2012-12-08 (×11): qty 1

## 2012-12-08 NOTE — Progress Notes (Signed)
Rec'd call from RN re: elevated BP. His BP has been climbing and is now significantly elevated, greater than 170 systolic. I restarted his hydralazine a decreased dose and will also restart his Lopressor at 25 mg twice a day. Further medication changes can be made as needed.

## 2012-12-08 NOTE — Plan of Care (Signed)
Problem: Phase II Progression Outcomes Goal: Tolerating diet Outcome: Not Progressing Poor appetite Goal: Other Phase II Outcomes/Goals Elevated b/p's  Problem: Phase III Progression Outcomes Goal: Up to chair & ambulate with assist (TID) amb to [facility] w/steady gait ,no DOE noted. Goal: VS Stable with increased activity Orthostatic VS charted for 13:37 p ,Mar.29th,2014

## 2012-12-08 NOTE — Progress Notes (Signed)
PULMONARY  / CRITICAL CARE MEDICINE  Name: CAMP GOPAL MRN: [id number] DOB: October 28, 1945    ADMISSION DATE:  12/05/2012 CONSULTATION DATE:  12/05/12  REFERRING MD :  Clifton James PRIMARY SERVICE: Cardiology  CHIEF COMPLAINT:  STEMI  BRIEF PATIENT DESCRIPTION: 67 y/o male with CKD s/p renal transplant presented to the [facility] ED on 3/26 with chest pain, found to have a STEMI. In the cath lab had multiple episodes of VFib which were all defibrillated quickly without CPR.  Intubated in setting of recurrent VFIB.  SIGNIFICANT EVENTS / STUDIES:  3/26 LHC> PCI prox LAD; prob thrombus RCA  LINES / TUBES: 3/26 ETT >> 3/27 3/26 L IJ CVL >> 3/26 balloon pump >> 3/28 3/26 temp pacer R fem >> 3/28  CULTURES:   ANTIBIOTICS:   HISTORY OF PRESENT ILLNESS:  67 y/o male with CKD s/p renal transplant presented to the [facility] ED on 3/26 with chest pain, found to have a STEMI. In the cath lab had multiple episodes of VFib which were all defibrillated quickly without CPR.  Intubated in setting of recurrent VFIB.  He was unable to provide history because he was intubated.    SUBJECTIVE:  Extubated successfully 3/27  VITAL SIGNS: Temp:  [97.7 F (36.5 C)-98.9 F (37.2 C)] 97.8 F (36.6 C) (03/29 0800) Pulse Rate:  [62-65] 65 (03/28 1629) Resp:  [0-26] 19 (03/29 0900) BP: (106-170)/(68-96) 157/71 mmHg (03/29 0900) SpO2:  [96 %-100 %] 100 % (03/29 0900) Weight:  [85.1 kg (187 lb 9.8 oz)] 85.1 kg (187 lb 9.8 oz) (03/29 0500) HEMODYNAMICS: CVP:  [14 mmHg-16 mmHg] 14 mmHg VENTILATOR SETTINGS:   INTAKE / OUTPUT: Intake/Output     03/28 0701 - 03/29 0700 03/29 0701 - 03/30 0700   P.O. 740    I.V. (mL/kg) 897.3 (10.5) 73.4 (0.9)   IV Piggyback     Total Intake(mL/kg) 1637.3 (19.2) 73.4 (0.9)   Urine (mL/kg/hr) 625 (0.3) 200 (1.1)   Emesis/NG output     Total Output 625 200   Net +1012.3 -126.6          PHYSICAL EXAMINATION:  Gen: comfortabl HEENT: NCAT, PERRL, EOMi, PULM: CTA B CV: S1/S2 sound  normal AB: BS+, soft, nontender, no hsm Ext: warm, no edema, no clubbing, no cyanosis Derm: no rash or skin breakdown Neuro: Awake and alert, following commands   LABS:  Recent Labs Lab 12/05/12 2140  12/06/12 0044 12/06/12 0216 12/06/12 0630 12/06/12 0647 12/06/12 1703 12/07/12 0200 12/08/12 0500  HGB  --   --  10.0*  --   --  9.2* 10.2* 9.4* 7.6*  WBC  --   < > 12.0*  --   --  10.0 13.7* 12.2* 6.0  PLT  --   < > 235  --   --  244 262 206 126*  NA  --   --  136  --   --  140  --  138 139  K  --   --  4.1  --   --  3.9  --  4.5 3.9  CL  --   --  102  --   --  108  --  105 107  CO2  --   < > 22  --   --  20  --  20 20  GLUCOSE  --   --  281*  --   --  180*  --  177* 116*  BUN  --   --  30*  --   --  29*  --  33* 40*  CREATININE  --   --  2.30*  --   --  2.23*  --  2.75* 3.01*  CALCIUM  --   < > 7.9*  --   --  7.7*  --  8.5 8.2*  MG  --   --  1.5  --   --   --   --   --   --   AST  --   --  102*  --   --  92*  --   --   --   ALT  --   --  68*  --   --  59*  --   --   --   ALKPHOS  --   --  116  --   --  97  --   --   --   BILITOT  --   --  0.5  --   --  0.3  --   --   --   PROT  --   --  5.3*  --   --  5.1*  --   --   --   ALBUMIN  --   --  3.1*  --   --  2.8*  --   --   --   INR  --   --  2.30*  --   --   --   --   --   --   LATICACIDVEN  --   --   --   --  2.5*  --   --   --   --   TROPONINI  --   --  >20.00*  --   --  >20.00* >20.00*  --   --   PHART 7.169*  --   --  7.340*  --   --   --   --   --   PCO2ART 41.6  --   --  43.3  --   --   --   --   --   PO2ART 465.0*  --   --  502.0*  --   --   --   --   --   < > = values in this interval not displayed.  Recent Labs Lab 12/07/12 0843 12/07/12 1139 12/07/12 1614 12/07/12 1632 12/07/12 2111  GLUCAP 156* 189* 163* 170* 113*    3/26 CXR >   ASSESSMENT / PLAN:  PULMONARY A:  Acute respiratory failure in setting of recurrent VFib; extubated 3/27 P:   -pulm hygiene  CARDIOVASCULAR A: STEMI, s/p  PCI Recurrent v-fib now stable post PCI, lidocaine, balloon pump P:  -Plans as per Dr Clifton James  RENAL A:  CKD s/p renal transplant; baseline Cr 2.8; high risk for AKI considering LHC, dye load P:   -monitor BMET/UOP -continue tacrolimus and cellcept; watch renal function carefully on tacrolimus  GASTROINTESTINAL A:  No acute issues P:   -pepcid for stress ulcer prophylaxis  HEMATOLOGIC A:  No acute issues P:    INFECTIOUS A:  No acute issues P:     ENDOCRINE A:  DM2 P:   -ICU hyperglycemia protocol, sensitive > convert once taking PO  NEUROLOGIC A:  Awake and alert, was following commands post multiple shocks so no indication for hypothermia P:     I have personally obtained a history, examined the patient, evaluated laboratory and imaging results, formulated the assessment and plan and placed orders.  PCCM will sign off. Please call  if we can help in any way  Levy Pupa, MD, PhD 12/08/2012, 9:15 AM [facility] [phone] or if no answer [phone]

## 2012-12-08 NOTE — Consult Note (Signed)
Edward Oconnell 12/08/2012 Edward Oconnell D Requesting Physician:  Dr Clifton James  Reason for Consult:  Renal failure of transplant, chronic, ?acute component HPI: The patient is a 67 y.o. year-old with hx of ADPKD, HTN, BPH, HL, DM2, CVA, skin cancer and shingles. He presented on 3/26 with severe CP acute onset, went straight to cath lab which showed severe LAD disease and RCA occlusion./ Rec'd DES to LAD and RCA recanalized during procedure. Had cardiogenic shock and recurrent vfib in cath lab requiring multiple defibrillation shocks and IABP.  Creat was 2.3 on admission and has gone up to 3.01 today. UOP yest was 625 cc, total admit I-O are +3.6L, weight is up 5 kg. BP is high now, not on any BP meds. Was taking hydralazine and metoprolol at home. Patient is without c/o's, no sob or CP, no cough or fever.   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 Creat was 6 when he rec'd a deceased donor transplant Jan 16, 2012at [facility].  According to patient he did OK then in the first few weeks after surgery had to go back in hospital for "BP" problems that "messed the kidney up". Since then creatinine has been in 2.3-3.0 range.  Sees only [facility] nephrologists, gets most care there.  Takes prograf and Myfortic, no steroids.  PKD kidneys were left in place.    ROS  no cp, fever  no abd pain  getting OOB walking in the room  no jt pain  no ankle swelling  wife says a little confused   Past Medical History:  Past Medical History  Diagnosis Date  . CAD (coronary artery disease)     nonobstructive  . Hypertension   . Hypothyroidism   . Benign prostatic hypertrophy   . Chronic kidney disease     s/p transplant per [facility]  . Hyperlipidemia   . Diabetes mellitus without complication   . Anginal pain   . Stroke 2009  . GERD (gastroesophageal reflux disease)   . Cancer     skin cancer  . History of shingles 08/2012    Lt eye shingles  . 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 Creat was 6 when  he rec'd a deceased donor transplant 2012-01-16at [facility].  According to patient he did OK then in the first few weeks after surgery had to go back in hospital for "BP" problems that "messed the kidney up". Since then creatinine has been in 2.3-3.0 range.  Sees only [facility] nephrologists, gets most care there.  Takes prograf and Myfortic, no steroids.  PKD kidneys were left in place.      Past Surgical History:  Past Surgical History  Procedure Laterality Date  . Kidney transplant      09/27/2010  . Other surgical history  11/21/12    cancerous cells removed from mid upper chest  . Eye surgery    . Other surgical history N/A 11/15/2012    pt had skin cancer removed from mid upper chest    Family History:  Family History  Problem Relation Age of Onset  . Kidney failure Mother     was on dialysis 3 times a week when she died  . Alcohol abuse Mother   . Hypertension Father   . Heart attack Sister   . Kidney disease Sister   . Aneurysm Sister   . COPD Sister   . Colon cancer Neg Hx   . Prostate cancer Neg Hx  Social History:  reports that he has never smoked. He has never used smokeless tobacco. He reports that he does not drink alcohol or use illicit drugs.  Allergies: No Known Allergies  Home medications: Prior to Admission medications   Medication Sig Start Date End Date Taking? Authorizing Provider  aspirin EC 81 MG tablet Take 81 mg by mouth daily.   Yes Historical Provider, MD  atorvastatin (LIPITOR) 40 MG tablet Take 40 mg by mouth every Monday, Wednesday, and Friday.   Yes Historical Provider, MD  Difluprednate (DUREZOL) 0.05 % EMUL Place 1 drop into the right eye every 3 (three) hours.   Yes Historical Provider, MD  Ferrous Sulfate (RA IRON) 27 MG TABS Take 1 tablet by mouth 2 (two) times daily.   Yes Historical Provider, MD  furosemide (LASIX) 40 MG tablet Take 40 mg by mouth daily.   Yes Historical Provider, MD  hydrALAZINE (APRESOLINE) 50 MG tablet Take 50 mg by mouth 4 (four)  times daily.   Yes Historical Provider, MD  levothyroxine (SYNTHROID, LEVOTHROID) 125 MCG tablet Take 125 mcg by mouth daily.   Yes Historical Provider, MD  losartan (COZAAR) 25 MG tablet Take 25 mg by mouth 2 (two) times daily.    Yes Historical Provider, MD  Magnesium Chloride (SLOW-MAG PO) Take 1 tablet by mouth 2 (two) times daily.   Yes Historical Provider, MD  metoprolol tartrate (LOPRESSOR) 25 MG tablet Take 25 mg by mouth daily.   Yes Historical Provider, MD  mycophenolate (MYFORTIC) 180 MG EC tablet Take 360 mg by mouth 2 (two) times daily.   Yes Historical Provider, MD  prednisoLONE acetate (PRED FORTE) 1 % ophthalmic suspension Place 1 drop into the right eye every 2 (two) hours while awake.   Yes Historical Provider, MD  Propylene Glycol (SYSTANE BALANCE OP) Place 1 drop into both eyes 4 (four) times daily.   Yes Historical Provider, MD  ranitidine (ZANTAC) 150 MG tablet Take 150 mg by mouth daily.   Yes Historical Provider, MD  sertraline (ZOLOFT) 100 MG tablet Take 100 mg by mouth daily.   Yes Historical Provider, MD  sodium bicarbonate 650 MG tablet Take 650 mg by mouth 3 (three) times daily.    Yes Historical Provider, MD  tacrolimus (PROGRAF) 1 MG capsule Take 3 mg by mouth 2 (two) times daily.   Yes Historical Provider, MD    Labs: Basic Metabolic Panel:  Recent Labs Lab 12/05/12 2135 12/06/12 0044 12/06/12 0647 12/07/12 0200 12/08/12 0500  NA 112* 136 140 138 139  K 2.4* 4.1 3.9 4.5 3.9  CL 77* 102 108 105 107  CO2  --  22 20 20 20   GLUCOSE 144* 281* 180* 177* 116*  BUN 27* 30* 29* 33* 40*  CREATININE 1.00 2.30* 2.23* 2.75* 3.01*  CALCIUM  --  7.9* 7.7* 8.5 8.2*   Liver Function Tests:  Recent Labs Lab 12/06/12 0044 12/06/12 0647  AST 102* 92*  ALT 68* 59*  ALKPHOS 116 97  BILITOT 0.5 0.3  PROT 5.3* 5.1*  ALBUMIN 3.1* 2.8*   No results found for this basename: LIPASE, AMYLASE,  in the last 168 hours No results found for this basename: AMMONIA,  in the  last 168 hours CBC:  Recent Labs Lab 12/06/12 0044 12/06/12 0647 12/06/12 1703 12/07/12 0200 12/08/12 0500  WBC 12.0* 10.0 13.7* 12.2* 6.0  NEUTROABS 10.6*  --   --   --   --   HGB 10.0* 9.2* 10.2* 9.4* 7.6*  HCT 29.3* 27.2* 30.0* 27.4* 21.7*  MCV 91.3 91.9 91.5 91.0 89.3  PLT 235 244 262 206 126*   PT/INR: @LABRCNTIP (inr:5) Cardiac Enzymes: ) Recent Labs Lab 12/06/12 0044 12/06/12 0647 12/06/12 1703  TROPONINI >20.00* >20.00* >20.00*   CBG:  Recent Labs Lab 12/07/12 0843 12/07/12 1139 12/07/12 1614 12/07/12 1632 12/07/12 2111  GLUCAP 156* 189* 163* 170* 113*     Physical Exam:  Blood pressure 157/71, pulse 65, temperature 97.8 F (36.6 C), temperature source Axillary, resp. rate 19, height 5\' 8"  (1.727 m), weight 85.1 kg (187 lb 9.8 oz), SpO2 100.00%.  Gen: alert, looks tired but not in distress Skin: no rash, cyanosis HEENT:  EOMI, sclera anicteric, throat clear Neck: no JVD, no bruits, no LAN Chest: crackles R base, L side clea CV: regular, no rub or gallop, no murmur, pedal pulses intact Abdomen: soft, nontender, no HSM or ascites Ext: trace LE edema bilat, no joint effusion or deformity, no gangrene or ulceration Neuro: alert, Ox3, no focal deficit  CXR 3/28- hazy R base, no edema UA- not done yet Impression/Plan 1. 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 Supportive care, TAC level, UA, UNa, continue meds. 5kg up but no CHF, no diuretics yet. Will follow 2. Cardiogenic shock- resolved 3. Acute NSTEMI , s/p heart cath 3/26 (DES to LAD) 4. DM2 5. HTN- may want to restart some of his home BP meds, just keep SBP up 115 or greater for renal perfusion   Vinson Moselle  MD [facility] [phone] pgr     [phone] cell 12/08/2012, 10:05 AM

## 2012-12-08 NOTE — Progress Notes (Signed)
Rhonda Barrett PA notified of elevated BP. PRN hydralazine order received. Pt resting comfortably in bed.

## 2012-12-08 NOTE — Progress Notes (Signed)
SUBJECTIVE:  No chest pain.  No SOB.     PHYSICAL EXAM Filed Vitals:   12/08/12 0500 12/08/12 0600 12/08/12 0700 12/08/12 0800  BP: 153/94 153/96 159/82   Pulse:      Temp: 98.5 F (36.9 C)   97.8 F (36.6 C)  TempSrc: Oral   Axillary  Resp: 14 17 16    Height:      Weight: 187 lb 9.8 oz (85.1 kg)     SpO2: 99% 100% 98%    General:  No distress Lungs:  Bilateral basilar crackles. Heart:  RRR, no rub, no murmur Abdomen:  Positive bowel sounds, no rebound no guarding Extremities:  Right groin with large echymosis but no pulsatile mass or bleeding Neuro:  Nonfocal  LABS: Lab Results  Component Value Date   TROPONINI >20.00* 12/06/2012   Results for orders placed during the hospital encounter of 12/05/12 (from the past 24 hour(s))  GLUCOSE, CAPILLARY     Status: Abnormal   Collection Time    12/07/12  8:43 AM      Result Value Range   Glucose-Capillary 156 (*) 70 - 99 mg/dL  GLUCOSE, CAPILLARY     Status: Abnormal   Collection Time    12/07/12 11:39 AM      Result Value Range   Glucose-Capillary 189 (*) 70 - 99 mg/dL  GLUCOSE, CAPILLARY     Status: Abnormal   Collection Time    12/07/12  4:14 PM      Result Value Range   Glucose-Capillary 163 (*) 70 - 99 mg/dL  GLUCOSE, CAPILLARY     Status: Abnormal   Collection Time    12/07/12  4:32 PM      Result Value Range   Glucose-Capillary 170 (*) 70 - 99 mg/dL  GLUCOSE, CAPILLARY     Status: Abnormal   Collection Time    12/07/12  9:11 PM      Result Value Range   Glucose-Capillary 113 (*) 70 - 99 mg/dL  BASIC METABOLIC PANEL     Status: Abnormal   Collection Time    12/08/12  5:00 AM      Result Value Range   Sodium 139  135 - 145 mEq/L   Potassium 3.9  3.5 - 5.1 mEq/L   Chloride 107  96 - 112 mEq/L   CO2 20  19 - 32 mEq/L   Glucose, Bld 116 (*) 70 - 99 mg/dL   BUN 40 (*) 6 - 23 mg/dL   Creatinine, Ser 1.61 (*) 0.50 - 1.35 mg/dL   Calcium 8.2 (*) 8.4 - 10.5 mg/dL   GFR calc non Af Amer 20 (*) >90 mL/min   GFR calc Af Amer 23 (*) >90 mL/min  CBC     Status: Abnormal   Collection Time    12/08/12  5:00 AM      Result Value Range   WBC 6.0  4.0 - 10.5 K/uL   RBC 2.43 (*) 4.22 - 5.81 MIL/uL   Hemoglobin 7.6 (*) 13.0 - 17.0 g/dL   HCT 09.6 (*) 04.5 - 40.9 %   MCV 89.3  78.0 - 100.0 fL   MCH 31.3  26.0 - 34.0 pg   MCHC 35.0  30.0 - 36.0 g/dL   RDW 81.1  91.4 - 78.2 %   Platelets 126 (*) 150 - 400 K/uL  TYPE AND SCREEN     Status: None   Collection Time    12/08/12  6:25 AM  Result Value Range   ABO/RH(D) O NEG     Antibody Screen NEG     Sample Expiration 12/11/2012      Intake/Output Summary (Last 24 hours) at 12/08/12 0836 Last data filed at 12/08/12 0800  Gross per 24 hour  Intake   1473 ml  Output    765 ml  Net    708 ml    ASSESSMENT AND PLAN:  Anterior MI with DES:  Continue care as below.    VF/Cardiogenic Shock:  IABP out yesterday.  Pacer out yesterday.  Still holding on beta blocker.  Switch to PO amio today.    CKD (history of transplant):  Creat continuing to rise.  I will ask renal to follow for any further suggestions.    Anemia:  Transfuse one unit PRBCs today given complexity of the situation.  Check stool guaiac.    DM:  Continue SSI.  Change to [facility].   Respiratory failure:  Extubated.     Rollene Rotunda 12/08/2012 8:36 AM

## 2012-12-09 DIAGNOSIS — N184 Chronic kidney disease, stage 4 (severe): Secondary | ICD-10-CM

## 2012-12-09 LAB — BASIC METABOLIC PANEL
BUN: 39 mg/dL — ABNORMAL HIGH (ref 6–23)
CO2: 20 mEq/L (ref 19–32)
CO2: 28 mEq/L (ref 19–32)
Calcium: 8.5 mg/dL (ref 8.4–10.5)
Chloride: 91 mEq/L — ABNORMAL LOW (ref 96–112)
Creatinine, Ser: 0.82 mg/dL (ref 0.50–1.35)
Glucose, Bld: 118 mg/dL — ABNORMAL HIGH (ref 70–99)
Glucose, Bld: 305 mg/dL — ABNORMAL HIGH (ref 70–99)
Potassium: 3.8 mEq/L (ref 3.5–5.1)
Sodium: 137 mEq/L (ref 135–145)

## 2012-12-09 LAB — GLUCOSE, CAPILLARY
Glucose-Capillary: 117 mg/dL — ABNORMAL HIGH (ref 70–99)
Glucose-Capillary: 128 mg/dL — ABNORMAL HIGH (ref 70–99)
Glucose-Capillary: 130 mg/dL — ABNORMAL HIGH (ref 70–99)
Glucose-Capillary: 133 mg/dL — ABNORMAL HIGH (ref 70–99)

## 2012-12-09 LAB — CBC
HCT: 24.7 % — ABNORMAL LOW (ref 39.0–52.0)
MCH: 30.9 pg (ref 26.0–34.0)
MCV: 90.8 fL (ref 78.0–100.0)
Platelets: 135 10*3/uL — ABNORMAL LOW (ref 150–400)
RBC: 2.72 MIL/uL — ABNORMAL LOW (ref 4.22–5.81)
RDW: 14.8 % (ref 11.5–15.5)
WBC: 7 10*3/uL (ref 4.0–10.5)

## 2012-12-09 LAB — TYPE AND SCREEN: ABO/RH(D): O NEG

## 2012-12-09 MED ORDER — GUAIFENESIN-DM 100-10 MG/5ML PO SYRP
10.0000 mL | ORAL_SOLUTION | ORAL | Status: DC | PRN
  Administered 2012-12-09: 10 mL via ORAL
  Filled 2012-12-09: qty 10

## 2012-12-09 MED ORDER — HYDRALAZINE HCL 50 MG PO TABS
50.0000 mg | ORAL_TABLET | Freq: Four times a day (QID) | ORAL | Status: DC
  Administered 2012-12-09 – 2012-12-12 (×13): 50 mg via ORAL
  Filled 2012-12-09 (×18): qty 1

## 2012-12-09 MED ORDER — FUROSEMIDE 10 MG/ML IJ SOLN
80.0000 mg | Freq: Once | INTRAMUSCULAR | Status: AC
  Administered 2012-12-09: 80 mg via INTRAVENOUS
  Filled 2012-12-09: qty 8

## 2012-12-09 NOTE — Progress Notes (Signed)
Pt watching "Recovering From a Heart Attack " video .

## 2012-12-09 NOTE — Progress Notes (Signed)
Patient arrived via w/c from 2900, alert and oriented.  Patient denies any complaints, family with all oriented to unit and routines.

## 2012-12-09 NOTE — Progress Notes (Signed)
SUBJECTIVE:  No chest pain.  No SOB.  Wants to walk.   PHYSICAL EXAM Filed Vitals:   12/09/12 0448 12/09/12 0500 12/09/12 0600 12/09/12 0700  BP:  142/70 153/88 152/93  Pulse:      Temp:      TempSrc:      Resp:  22 16 22   Height:      Weight: 187 lb 8 oz (85.049 kg)     SpO2:  99% 99% 99%   General:  No distress Lungs:  Bilateral basilar crackles. Heart:  RRR, no rub, no murmur Abdomen:  Positive bowel sounds, no rebound no guarding Extremities:  Right groin with large echymosis but no pulsatile mass or bleeding Neuro:  Nonfocal  LABS: Lab Results  Component Value Date   TROPONINI >20.00* 12/06/2012   Results for orders placed during the hospital encounter of 12/05/12 (from the past 24 hour(s))  PREPARE RBC (CROSSMATCH)     Status: None   Collection Time    12/08/12  9:10 AM      Result Value Range   Order Confirmation ORDER PROCESSED BY BLOOD BANK    GLUCOSE, CAPILLARY     Status: Abnormal   Collection Time    12/08/12 12:12 PM      Result Value Range   Glucose-Capillary 131 (*) 70 - 99 mg/dL  SODIUM, URINE, RANDOM     Status: None   Collection Time    12/08/12 12:59 PM      Result Value Range   Sodium, Ur 33    URINALYSIS, ROUTINE W REFLEX MICROSCOPIC     Status: Abnormal   Collection Time    12/08/12  1:00 PM      Result Value Range   Color, Urine YELLOW  YELLOW   APPearance CLEAR  CLEAR   Specific Gravity, Urine 1.015  1.005 - 1.030   pH 5.0  5.0 - 8.0   Glucose, UA NEGATIVE  NEGATIVE mg/dL   Hgb urine dipstick SMALL (*) NEGATIVE   Bilirubin Urine NEGATIVE  NEGATIVE   Ketones, ur NEGATIVE  NEGATIVE mg/dL   Protein, ur NEGATIVE  NEGATIVE mg/dL   Urobilinogen, UA 0.2  0.0 - 1.0 mg/dL   Nitrite NEGATIVE  NEGATIVE   Leukocytes, UA NEGATIVE  NEGATIVE  URINE MICROSCOPIC-ADD ON     Status: None   Collection Time    12/08/12  1:00 PM      Result Value Range   Squamous Epithelial / LPF RARE  RARE   RBC / HPF 0-2  <3 RBC/hpf  GLUCOSE, CAPILLARY      Status: Abnormal   Collection Time    12/08/12  5:42 PM      Result Value Range   Glucose-Capillary 172 (*) 70 - 99 mg/dL  GLUCOSE, CAPILLARY     Status: Abnormal   Collection Time    12/08/12  8:11 PM      Result Value Range   Glucose-Capillary 132 (*) 70 - 99 mg/dL  GLUCOSE, CAPILLARY     Status: Abnormal   Collection Time    12/09/12 12:20 AM      Result Value Range   Glucose-Capillary 128 (*) 70 - 99 mg/dL  BASIC METABOLIC PANEL     Status: Abnormal   Collection Time    12/09/12  3:45 AM      Result Value Range   Sodium 129 (*) 135 - 145 mEq/L   Potassium 3.9  3.5 - 5.1 mEq/L   Chloride 91 (*)  96 - 112 mEq/L   CO2 28  19 - 32 mEq/L   Glucose, Bld 305 (*) 70 - 99 mg/dL   BUN 16  6 - 23 mg/dL   Creatinine, Ser 1.61  0.50 - 1.35 mg/dL   Calcium 8.5  8.4 - 09.6 mg/dL   GFR calc non Af Amer >90  >90 mL/min   GFR calc Af Amer >90  >90 mL/min  CBC     Status: Abnormal   Collection Time    12/09/12  3:45 AM      Result Value Range   WBC 7.0  4.0 - 10.5 K/uL   RBC 2.72 (*) 4.22 - 5.81 MIL/uL   Hemoglobin 8.4 (*) 13.0 - 17.0 g/dL   HCT 04.5 (*) 40.9 - 81.1 %   MCV 90.8  78.0 - 100.0 fL   MCH 30.9  26.0 - 34.0 pg   MCHC 34.0  30.0 - 36.0 g/dL   RDW 91.4  78.2 - 95.6 %   Platelets 135 (*) 150 - 400 K/uL  BASIC METABOLIC PANEL     Status: Abnormal   Collection Time    12/09/12  4:21 AM      Result Value Range   Sodium 137  135 - 145 mEq/L   Potassium 3.8  3.5 - 5.1 mEq/L   Chloride 107  96 - 112 mEq/L   CO2 20  19 - 32 mEq/L   Glucose, Bld 118 (*) 70 - 99 mg/dL   BUN 39 (*) 6 - 23 mg/dL   Creatinine, Ser 2.13 (*) 0.50 - 1.35 mg/dL   Calcium 8.4  8.4 - 08.6 mg/dL   GFR calc non Af Amer 22 (*) >90 mL/min   GFR calc Af Amer 25 (*) >90 mL/min  GLUCOSE, CAPILLARY     Status: Abnormal   Collection Time    12/09/12  4:21 AM      Result Value Range   Glucose-Capillary 117 (*) 70 - 99 mg/dL    Intake/Output Summary (Last 24 hours) at 12/09/12 0805 Last data filed at  12/09/12 0600  Gross per 24 hour  Intake 1478.4 ml  Output   1390 ml  Net   88.4 ml    ASSESSMENT AND PLAN:  Anterior MI with DES:  Continue care as below.  Continue Brilinta.   VF/Cardiogenic Shock/respiratory failure:  Recovered.  Off of IV amiodarone.  No further arrhythmia.   Telemetry reviewed 12/09/2012.  No further arrhythmias.    CKD (history of transplant):  Renal following. I appreciate their input.  Creat is better today.  This is his baseline.   Anemia:  Transfused one unit PRBCs yesterday.  Hbg is up.  I will follow.  DM:  Continue SSI.  Change to [facility].   HTN:  BP has been up.  Beta blocker restarted.  On Norvasc.  Also restarted hydralazine and I will increase today to his home dose.  BPs are better since last night.     Fayrene Fearing [facility] 12/09/2012 8:05 AM

## 2012-12-09 NOTE — Progress Notes (Addendum)
Subjective: No complaints  Objective Vital signs in last 24 hours: Filed Vitals:   12/09/12 1000 12/09/12 1100 12/09/12 1200 12/09/12 1300  BP: 156/82 148/74 149/89 152/87  Pulse:      Temp:   97.9 F (36.6 C)   TempSrc:   Oral   Resp: 27 21 18 23   Height:      Weight:      SpO2: 99% 99% 99% 99%   Weight change: -0.05 kg (-1.8 oz)  Intake/Output Summary (Last 24 hours) at 12/09/12 1336 Last data filed at 12/09/12 1000  Gross per 24 hour  Intake   1585 ml  Output   1476 ml  Net    109 ml   Labs: Basic Metabolic Panel:  Recent Labs Lab 12/07/12 0200 12/08/12 0500 12/09/12 0345 12/09/12 0421  NA 138 139 129* 137  K 4.5 3.9 3.9 3.8  CL 105 107 91* 107  CO2 20 20 28 20   GLUCOSE 177* 116* 305* 118*  BUN 33* 40* 16 39*  CREATININE 2.75* 3.01* 0.82 2.82*  CALCIUM 8.5 8.2* 8.5 8.4   Liver Function Tests:  Recent Labs Lab 12/06/12 0044 12/06/12 0647  AST 102* 92*  ALT 68* 59*  ALKPHOS 116 97  BILITOT 0.5 0.3  PROT 5.3* 5.1*  ALBUMIN 3.1* 2.8*   No results found for this basename: LIPASE, AMYLASE,  in the last 168 hours No results found for this basename: AMMONIA,  in the last 168 hours CBC:  Recent Labs Lab 12/06/12 0044  12/06/12 1703 12/07/12 0200 12/08/12 0500 12/09/12 0345  WBC 12.0*  < > 13.7* 12.2* 6.0 7.0  NEUTROABS 10.6*  --   --   --   --   --   HGB 10.0*  < > 10.2* 9.4* 7.6* 8.4*  HCT 29.3*  < > 30.0* 27.4* 21.7* 24.7*  MCV 91.3  < > 91.5 91.0 89.3 90.8  PLT 235  < > 262 206 126* 135*  < > = values in this interval not displayed. PT/INR: @LABRCNTIP (inr:5)   Scheduled Meds ) . amiodarone  400 mg Oral BID  . amLODipine  2.5 mg Oral Daily  . aspirin  81 mg Oral Daily  . atorvastatin  80 mg Oral q1800  . famotidine  20 mg Oral Daily  . hydrALAZINE  50 mg Oral QID  . insulin aspart  1-3 Units Subcutaneous TID WC  . levothyroxine  125 mcg Oral QAC breakfast  . metoprolol tartrate  25 mg Oral BID  . mycophenolate  360 mg Oral BID  .  tacrolimus  3 mg Oral BID  . Ticagrelor  90 mg Per NG tube BID    Physical Exam:  Blood pressure 152/87, pulse 65, temperature 97.9 F (36.6 C), temperature source Oral, resp. rate 23, height 5\' 8"  (1.727 m), weight 85.049 kg (187 lb 8 oz), SpO2 99.00%.  Gen: alert, looks tired but not in distress  Skin: no rash, cyanosis  HEENT: EOMI, sclera anicteric, throat clear  Neck: no JVD, no bruits, no LAN  Chest: crackles R base, L side clear  CV: regular, no rub or gallop, no murmur, pedal pulses intact  Abdomen: soft, nontender, no HSM or ascites  Ext: trace LE edema bilat, no joint effusion or deformity, no gangrene or ulceration  Neuro: alert, Ox3, no focal deficit   CXR 3/28- hazy R base, no edema  UA- not done yet   Impression/Plan  1. 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 Resolving, will sign off. Pt will f/u with renal MD after discharge. 2. Cardiogenic shock- resolved 3. Acute NSTEMI , s/p heart cath 3/26 (DES to LAD) 4. DM2 5. HTN- some of his home BP meds were resumed by primary    Vinson Moselle  MD [phone] pgr    [phone] cell 12/09/2012, 1:36 PM

## 2012-12-09 NOTE — Progress Notes (Signed)
Ambulated in hall x 400 ft w/steady and fairly brisk gait .Denied :SOB ,dizziness,pain ,pressure,tightness,numbness and tingling.HR sustained in 60's .SpO2 sustained @ 100 % on RA   .RR sustained @ 28 - 30 .

## 2012-12-09 NOTE — Progress Notes (Signed)
Pt's spouse informed that Dr Antoine Poche stated family may bring food from home ( not salty or fatty) to encourage Pt to eat more than the 10 % - 15 % at ea meal .

## 2012-12-10 LAB — BASIC METABOLIC PANEL
BUN: 43 mg/dL — ABNORMAL HIGH (ref 6–23)
Chloride: 105 mEq/L (ref 96–112)
GFR calc Af Amer: 23 mL/min — ABNORMAL LOW (ref >90)
Potassium: 3.5 mEq/L (ref 3.5–5.1)
Sodium: 140 mEq/L (ref 135–145)

## 2012-12-10 LAB — GLUCOSE, CAPILLARY
Glucose-Capillary: 106 mg/dL — ABNORMAL HIGH (ref 70–99)
Glucose-Capillary: 137 mg/dL — ABNORMAL HIGH (ref 70–99)
Glucose-Capillary: 122 mg/dL — ABNORMAL HIGH (ref 70–99)

## 2012-12-10 LAB — CBC
HCT: 27 % — ABNORMAL LOW (ref 39.0–52.0)
Platelets: 153 10*3/uL (ref 150–400)
RDW: 14.3 % (ref 11.5–15.5)
WBC: 6.7 10*3/uL (ref 4.0–10.5)

## 2012-12-10 MED ORDER — SODIUM CHLORIDE 0.9 % IJ SOLN
10.0000 mL | INTRAMUSCULAR | Status: DC | PRN
  Administered 2012-12-10 – 2012-12-11 (×4): 30 mL
  Administered 2012-12-12: 10 mL

## 2012-12-10 MED ORDER — SODIUM CHLORIDE 0.9 % IJ SOLN: 10.0000 mL | Freq: Two times a day (BID) | INTRAMUSCULAR | Status: DC

## 2012-12-10 NOTE — Progress Notes (Signed)
    SUBJECTIVE: No chest pain or SOB. Walking the hallways without trouble.   BP 156/80  Pulse 63  Temp(Src) 98.3 F (36.8 C) (Oral)  Resp 20  Ht 5\' 8"  (1.727 m)  Wt 184 lb 15.5 oz (83.9 kg)  BMI 28.13 kg/m2  SpO2 98%  Intake/Output Summary (Last 24 hours) at 12/10/12 1007 Last data filed at 12/10/12 0900  Gross per 24 hour  Intake    360 ml  Output   1875 ml  Net  -1515 ml    PHYSICAL EXAM General: Well developed, well nourished, in no acute distress. Alert and oriented x 3.  Psych:  Good affect, responds appropriately Neck: No JVD. No masses noted.  Lungs: Clear bilaterally with no wheezes or rhonci noted.  Heart: RRR with no murmurs noted. Abdomen: Bowel sounds are present. Soft, non-tender.  Extremities: No lower extremity edema. Large ecchymosis right groin. Soft.   LABS: Basic Metabolic Panel:  Recent Labs  11/91/47 0421 12/10/12 0555  NA 137 140  K 3.8 3.5  CL 107 105  CO2 20 22  GLUCOSE 118* 117*  BUN 39* 43*  CREATININE 2.82* 3.09*  CALCIUM 8.4 8.6   CBC:  Recent Labs  12/09/12 0345 12/10/12 0555  WBC 7.0 6.7  HGB 8.4* 9.1*  HCT 24.7* 27.0*  MCV 90.8 91.5  PLT 135* 153   Current Meds: . amiodarone  400 mg Oral BID  . amLODipine  2.5 mg Oral Daily  . aspirin  81 mg Oral Daily  . atorvastatin  80 mg Oral q1800  . famotidine  20 mg Oral Daily  . hydrALAZINE  50 mg Oral QID  . insulin aspart  1-3 Units Subcutaneous TID WC  . levothyroxine  125 mcg Oral QAC breakfast  . metoprolol tartrate  25 mg Oral BID  . mycophenolate  360 mg Oral BID  . tacrolimus  3 mg Oral BID  . Ticagrelor  90 mg Per NG tube BID   Echo 12/06/12:  Left ventricle: The cavity size was normal. Wall thickness was increased in a pattern of mild LVH. Systolic function was normal. The estimated ejection fraction was in the range of 60% to 65%. Wall motion was normal; there were no regional wall motion abnormalities. - Right ventricle: The cavity size was mildly  dilated. Systolic function was mildly reduced.  ASSESSMENT AND PLAN: 67 yo male with history of DM, HTN, HLD, chronic kidney disease s/p kidney transplant, moderately severe CAD by cath 2004 admitted with anterior STEMI complicated by incessant VF during cath evening of 12/05/12.   1. Anterior STEMI complicated by incessant VF and cardiogenic shock: s/p DES x 1 proximal LAD. Small non-dominant RCA was occluded at beginning of case and reperfused with medical therapy during case. Echo 12/06/12 with preserved LV systolic function. He is currently hemodynamically stable.Continue ASA/Brilinta for at least one year. Continue statin and beta blocker. No Ace-inh/ARB with chronic renal insufficiency s/p kidney transplant.    2. Chronic kidney disease, stage III s/p kidney transplant: Stable post cath. Appreciate Nephrology consult.    3. DM: SSI for now   4. Respiratory failure: Stable. Extubated and doing well.   Cardiac rehab today. Will need 48 more hours hospitalization.   MCALHANY,CHRISTOPHER  3/31/201410:07 AM

## 2012-12-10 NOTE — Progress Notes (Signed)
CARDIAC REHAB PHASE I   PRE:  Rate/Rhythm: 59SB  BP:  Supine:   Sitting: 118/80  Standing:    SaO2: 99%RA  MODE:  Ambulation: 1100 ft   POST:  Rate/Rhythm: 69  BP:  Supine:   Sitting: 160/90  Standing:    SaO2: 100%RA 1055-1150 Pt stated he made 5 laps earlier(2500FT) independently. I walked with pt 1100 ft and his BP became elevated. Asked pt to keep walk to 2 laps since BP went up. No CP. Pt very motivated and doing very well with activity. Education completed with pt and wife except for exercise ed. Will complete tomorrow. Gave diabetic and heart healthy diets. Discussed CRP 2 and pt gave permission to refer to [facility] program.   Luetta Nutting, RN BSN  12/10/2012 11:46 AM

## 2012-12-11 LAB — CBC
MCH: 30.7 pg (ref 26.0–34.0)
MCHC: 34.4 g/dL (ref 30.0–36.0)
RDW: 14.1 % (ref 11.5–15.5)

## 2012-12-11 LAB — GLUCOSE, CAPILLARY
Glucose-Capillary: 125 mg/dL — ABNORMAL HIGH (ref 70–99)
Glucose-Capillary: 115 mg/dL — ABNORMAL HIGH (ref 70–99)
Glucose-Capillary: 145 mg/dL — ABNORMAL HIGH (ref 70–99)

## 2012-12-11 LAB — BASIC METABOLIC PANEL
BUN: 41 mg/dL — ABNORMAL HIGH (ref 6–23)
Calcium: 8.3 mg/dL — ABNORMAL LOW (ref 8.4–10.5)
Creatinine, Ser: 2.86 mg/dL — ABNORMAL HIGH (ref 0.50–1.35)
GFR calc Af Amer: 25 mL/min — ABNORMAL LOW (ref >90)
GFR calc non Af Amer: 21 mL/min — ABNORMAL LOW (ref >90)
Glucose, Bld: 102 mg/dL — ABNORMAL HIGH (ref 70–99)

## 2012-12-11 MED ORDER — WHITE PETROLATUM GEL
Status: DC | PRN
  Filled 2012-12-11: qty 5

## 2012-12-11 MED ORDER — POTASSIUM CHLORIDE CRYS ER 20 MEQ PO TBCR
20.0000 meq | EXTENDED_RELEASE_TABLET | Freq: Once | ORAL | Status: AC
  Administered 2012-12-11: 20 meq via ORAL
  Filled 2012-12-11: qty 1

## 2012-12-11 MED ORDER — LOPERAMIDE HCL 2 MG PO CAPS
2.0000 mg | ORAL_CAPSULE | ORAL | Status: DC | PRN
  Administered 2012-12-11: 2 mg via ORAL
  Filled 2012-12-11: qty 1

## 2012-12-11 NOTE — Progress Notes (Signed)
[id number] Observed pt walking independently with steady gait. Walked 1100 ft. No CP. Told pt he could add one more lap today after resting 3 hours. He would be adding another 500 ft to distance. Gave pt and wife exercise ed. Understanding voiced. Referring to [facility] Phase 2. Luetta Nutting RN BSN

## 2012-12-11 NOTE — Progress Notes (Signed)
SUBJECTIVE: No chest pain or SOB.   BP 120/73  Pulse 59  Temp(Src) 97.8 F (36.6 C) (Oral)  Resp 17  Ht 5\' 8"  (1.727 m)  Wt 183 lb 3.2 oz (83.1 kg)  BMI 27.86 kg/m2  SpO2 99%  Intake/Output Summary (Last 24 hours) at 12/11/12 7846 Last data filed at 12/11/12 0517  Gross per 24 hour  Intake    960 ml  Output    925 ml  Net     35 ml    PHYSICAL EXAM General: Well developed, well nourished, in no acute distress. Alert and oriented x 3.  Psych:  Good affect, responds appropriately Neck: No JVD. No masses noted.  Lungs: Clear bilaterally with no wheezes or rhonci noted.  Heart: RRR with no murmurs noted. Abdomen: Bowel sounds are present. Soft, non-tender.  Extremities: No lower extremity edema.   LABS: Basic Metabolic Panel:  Recent Labs  96/29/52 0555 12/11/12 0500  NA 140 141  K 3.5 3.6  CL 105 107  CO2 22 22  GLUCOSE 117* 102*  BUN 43* 41*  CREATININE 3.09* 2.86*  CALCIUM 8.6 8.3*   CBC:  Recent Labs  12/10/12 0555 12/11/12 0500  WBC 6.7 5.1  HGB 9.1* 8.5*  HCT 27.0* 24.7*  MCV 91.5 89.2  PLT 153 169    Current Meds: . amiodarone  400 mg Oral BID  . amLODipine  2.5 mg Oral Daily  . aspirin  81 mg Oral Daily  . atorvastatin  80 mg Oral q1800  . famotidine  20 mg Oral Daily  . hydrALAZINE  50 mg Oral QID  . insulin aspart  1-3 Units Subcutaneous TID WC  . levothyroxine  125 mcg Oral QAC breakfast  . metoprolol tartrate  25 mg Oral BID  . mycophenolate  360 mg Oral BID  . sodium chloride  10-40 mL Intracatheter Q12H  . tacrolimus  3 mg Oral BID  . Ticagrelor  90 mg Per NG tube BID   Echo 12/06/12:  Left ventricle: The cavity size was normal. Wall thickness was increased in a pattern of mild LVH. Systolic function was normal. The estimated ejection fraction was in the range of 60% to 65%. Wall motion was normal; there were no regional wall motion abnormalities. - Right ventricle: The cavity size was mildly dilated. Systolic function was  mildly reduced.  ASSESSMENT AND PLAN: 67 yo male with history of DM, HTN, HLD, chronic kidney disease s/p kidney transplant, moderately severe CAD by cath 2004 admitted with anterior STEMI complicated by incessant VF during cath evening of 12/05/12.   1. Anterior STEMI complicated by incessant VF and cardiogenic shock: s/p DES x 1 proximal LAD. Small non-dominant RCA was occluded at beginning of case and reperfused with medical therapy during case. Echo 12/06/12 with preserved LV systolic function. He is currently hemodynamically stable.Continue ASA/Brilinta for at least one year. Continue statin and beta blocker. No Ace-inh/ARB with chronic renal insufficiency s/p kidney transplant. He is ambulating and doing well with cardiac rehab.   2. Chronic kidney disease, stage III s/p kidney transplant: Stable post cath. Nephrology consult team has signed off.   3. DM: SSI for now   4. Respiratory failure: Stable. Extubated and doing well.   5. Hypokalemia: Will replace with KDur 20 meq po x 1 this am.   6. Anemia: Stable. Repeat CBC in am.   7. Dispo: Continue rehab today. Likely discharge home tomorrow if stable. Will need Brilinta starter packet.  Edward Oconnell  4/1/20146:52 AM

## 2012-12-12 ENCOUNTER — Other Ambulatory Visit: Payer: Self-pay | Admitting: Physician Assistant

## 2012-12-12 ENCOUNTER — Telehealth: Payer: Self-pay | Admitting: Cardiovascular Disease

## 2012-12-12 ENCOUNTER — Encounter (HOSPITAL_COMMUNITY): Payer: Self-pay | Admitting: Physician Assistant

## 2012-12-12 DIAGNOSIS — R001 Bradycardia, unspecified: Secondary | ICD-10-CM

## 2012-12-12 DIAGNOSIS — E876 Hypokalemia: Secondary | ICD-10-CM

## 2012-12-12 DIAGNOSIS — N184 Chronic kidney disease, stage 4 (severe): Secondary | ICD-10-CM

## 2012-12-12 DIAGNOSIS — D649 Anemia, unspecified: Secondary | ICD-10-CM

## 2012-12-12 DIAGNOSIS — Z940 Kidney transplant status: Secondary | ICD-10-CM

## 2012-12-12 DIAGNOSIS — J9600 Acute respiratory failure, unspecified whether with hypoxia or hypercapnia: Secondary | ICD-10-CM

## 2012-12-12 DIAGNOSIS — K219 Gastro-esophageal reflux disease without esophagitis: Secondary | ICD-10-CM

## 2012-12-12 DIAGNOSIS — R570 Cardiogenic shock: Secondary | ICD-10-CM

## 2012-12-12 DIAGNOSIS — I10 Essential (primary) hypertension: Secondary | ICD-10-CM

## 2012-12-12 DIAGNOSIS — I2109 ST elevation (STEMI) myocardial infarction involving other coronary artery of anterior wall: Secondary | ICD-10-CM

## 2012-12-12 DIAGNOSIS — I469 Cardiac arrest, cause unspecified: Secondary | ICD-10-CM

## 2012-12-12 LAB — BASIC METABOLIC PANEL
CO2: 20 mEq/L (ref 19–32)
Calcium: 8.4 mg/dL (ref 8.4–10.5)
Creatinine, Ser: 2.56 mg/dL — ABNORMAL HIGH (ref 0.50–1.35)
GFR calc Af Amer: 28 mL/min — ABNORMAL LOW (ref >90)
GFR calc non Af Amer: 25 mL/min — ABNORMAL LOW (ref >90)
Sodium: 138 mEq/L (ref 135–145)

## 2012-12-12 LAB — CBC
MCV: 89 fL (ref 78.0–100.0)
Platelets: 173 10*3/uL (ref 150–400)
RDW: 14.1 % (ref 11.5–15.5)
WBC: 5 10*3/uL (ref 4.0–10.5)

## 2012-12-12 MED ORDER — POTASSIUM CHLORIDE CRYS ER 20 MEQ PO TBCR
20.0000 meq | EXTENDED_RELEASE_TABLET | Freq: Once | ORAL | Status: AC
  Administered 2012-12-12: 20 meq via ORAL
  Filled 2012-12-12: qty 1

## 2012-12-12 MED ORDER — ATORVASTATIN CALCIUM 40 MG PO TABS
40.0000 mg | ORAL_TABLET | Freq: Every day | ORAL | Status: DC
  Filled 2012-12-12: qty 1

## 2012-12-12 MED ORDER — NITROGLYCERIN 0.4 MG SL SUBL: 0.4000 mg | SUBLINGUAL_TABLET | SUBLINGUAL | Status: DC | PRN

## 2012-12-12 MED ORDER — METOPROLOL TARTRATE 25 MG PO TABS: 25.0000 mg | ORAL_TABLET | Freq: Two times a day (BID) | ORAL | Status: DC

## 2012-12-12 MED ORDER — TICAGRELOR 90 MG PO TABS: 90.0000 mg | ORAL_TABLET | Freq: Two times a day (BID) | ORAL | Status: DC

## 2012-12-12 MED ORDER — LOSARTAN POTASSIUM 25 MG PO TABS
25.0000 mg | ORAL_TABLET | Freq: Two times a day (BID) | ORAL | Status: DC
  Administered 2012-12-12: 25 mg via ORAL
  Filled 2012-12-12 (×2): qty 1

## 2012-12-12 NOTE — Telephone Encounter (Signed)
New problem    Per Isla Pence - appt with Dr. Clifton James on  4/8 need to be seen before  4/9 . S/p stemi.

## 2012-12-12 NOTE — Discharge Summary (Signed)
Discharge Summary   Patient ID: Edward Oconnell,  MRN: [id number], DOB/AGE: 10-24-1945 67 y.o.  Admit date: 12/05/2012 Discharge date: 12/12/2012  Primary Physician: Crawford Givens, MD Primary Cardiologist: C. Clifton James, MD  Discharge Diagnoses Principal Problem:   ST elevation myocardial infarction (STEMI) of anterior wall  - S/p cardiac cath + PCI 12/05/12: 30% oLAD, 20% mLAD, 95-99% pLAD s/p DES, 60% oD1, 70-80% pRamus, 40% pLCx, 50% AV groove Cx, 50% PLB, 100% mRCA s/p recanalization intra-procedurally, noted to be small non-dominant with 70% pRCA stenosis post-procedure  - DAPT- ASA/Brilinta x 12 months  - EF 55-60%, RV dilatation on echo  - Complicated by acute resp failure, incessant VF, cardiogenic shock and bradycardia as below Active Problems:   VF arrest  - S/p defibrillation x 13 total with ROSC  - Temporarily placed on amiodarone and lidocaine gtt with no further ventricular arrhythmias post-PCI   Acute respiratory failure  - During cath s/p intubation  - Successfully extubated 12/06/12   Cardiogenic shock  - S/p IABP and dopamine support   Bradycardia  - Junctional bradycardia post ROSC during cath s/p TV pacemaker placement   CORONARY ARTERY DISEASE  - Discharged on ASA/Brilinta/ARB/BB/statin/NTG SL PRN   HYPOTHYROIDISM   DIABETES MELLITUS, TYPE II   HYPERLIPIDEMIA, MIXED   HYPERTENSION   S/p cadaver renal transplant   Acute on chronic kidney disease (CKD), stage IV (severe)   Normocytic anemia  - Hgb 7.6 post-cath s/p pRBC x 1   Hypokalemia   GERD  Allergies No Known Allergies  Diagnostic Studies/Procedures  CARDIAC CATHETERIZATION + PCI- 12/05/12  Hemodynamic Findings:  Central aortic pressure: 128/79  Left ventricular pressure: Not obtained due to instability of pt at end of case  Angiographic Findings:  Left main: 30% ostial stenosis. 20% mid stenosis.  Left Anterior Descending Artery: Large caliber vessel that courses to the apex. The proximal vessel  is hazy with diffuse 95-99% stenosis extending from the ostium down to the first diagonal branch. The remainder of the LAD has no flow limiting disease. The diagonal branch is patent ostial 60% stenosis.  Ramus Intermediate: Moderate caliber vessel with 70-80% proximal stenosis.  Circumflex Artery: Large caliber, dominant vessel with 40% proximal stenosis. The first obtuse marginal branch is small in caliber with no obstructive disease. The second obtuse marginal branch is small with no disease. The distal AV groove Circumflex has a focal 50% stenosis. The left posterolateral branch has 50% stenosis.  Right Coronary Artery: Small caliber (1.75 mm) non-dominant vessel with 100% mid occlusion. (This vessel recanalized during the procedure and at the end of the procedure was noted to be small, non-dominant with a proximal 70% stenosis.  Left Ventricular Angiogram: Deferred.  Impression:  1. Acute anterior STEMI secondary to severe proximal LAD stenosis, now s/p DES x 1 proximal LAD  2. Occlusion of small, non-dominant RCA with resolution of thrombus with anti-coagulation and anti-thrombotic therapy (too small for PCI)  3. Cardiogenic shock, IABP in place.  4. Ventricular fibrillation arrest  5. Respiratory failure  CENTRAL VENOUS CATHETER INSERTION - 12/06/12  Evaluation  Blood flow good  Complications: No apparent complications  Patient did tolerate procedure well.  Chest X-ray ordered to verify placement. CXR: pending.   PORTABLE CHEST X-RAY - 12/06/12  IMPRESSION:  1. Support apparatus (IABP) as discussed above- see full report for details.  2. No acute pulmonary findings  PORTABLE CHEST X-RAY - 12/06/12  IMPRESSION:  1. Stable lines and tubes, aside from mildly kinked of the  left IJ  central line - probably at the skin entry site.  2. No acute cardiopulmonary abnormality.  TRANSTHORACIC ECHOCARDIOGRAM - 12/06/12  - Left ventricle: The cavity size was normal. Wall thickness was  increased in a pattern of mild LVH. Systolic function was normal. The estimated ejection fraction was in the range of 60% to 65%. Wall motion was normal; there were no regional wall motion abnormalities. - Right ventricle: The cavity size was mildly dilated. Systolic function was mildly reduced.  PORTABLE CHEST X-RAY - 12/07/12   IMPRESSION:  Minimal atelectasis at the right lung base.  History of Present Illness/Hospital Course Edward Oconnell is a 67 y.o. male who was admitted to [facility] on 12/05/12 with the above problem list.  He is a history of nonobstructive CAD noted on prior cardiac catheterization 2004, type 2 diabetes, hypertension and chronic renal insufficiency status post renal transplant who presented to [facility] via EMS with acute onset chest pain described as central, severe radiating to his left arm. 12-lead EKG performed by EMS revealed anterior and inferior ST elevation. He was brought emergently to the cath lab. He experienced multiple episodes of V. fib arrest requiring defibrillation. He was emergently intubated and started on both amiodarone and lidocaine drips.  As above, cardiac catheterization the proximal LAD disease status post drug-eluting stent placement in 100% mid occlusion a small nondominant RCA. There is diffuse nonobstructive disease elsewhere. The mid RCA recanalized again the procedure revealing 70% proximal stenosis. The cardiac catheterization was complicated by incessant VF requiring defibrillation. He did develop junctional bradycardia after return of spontaneous circulation requiring transvenous pacer placement. He was hemodynamically unstable secondary to cardiogenic shock and an intra-aortic balloon pump was placed. He was loaded with Brilinta and transferred to CCU in stable condition.  He improved gradually with quiescence of ventricular arrhythmias. Lidocaine amiodarone drips were gradually withdrawn as was a temporary pacemaker. IABP weaning  was attempted, however the patient remained hypotensive requiring dopamine support. As above, 2-D echocardiogram was performed revealing preserved EF and mildly dilated RV. There was an incident of the patient's heparin infusion leaking and the intra-aortic balloon pump clotting as a result leading to loss of arterial waveforms. This was remediated without incident.   The following day, the IABP and TV pacer were able to be removed. The patient's hemodynamic status running stable. Amiodarone IV was replaced with by mouth to suppress further ventricular arrhythmias. He was maintained on aspirin, Brilinta and statin initially. He did develop a mild acute on chronic renal insufficiency likely related to his hemodynamic compromise on presentation and contrast exposure cardiac catheterization. This gradually improved throughout the admission. Nephrology was consulted and recommended supportive care. He did develop mildly worsening anemia with hemoglobin dropping down to 7.6 and did receive one unit of packed red blood cells. Hemoglobin improved to greater than 8 and remained stable for the rest of the admission.   He continued to improve and ambulated well with cardiac rehabilitation. He was evaluated by Dr. Clifton James this morning, and was deemed stable for discharge. He will followup within 7 days given his post-STEMI status. He will be discharged on the medication regimen outlined below. This information, including post cath instructions and activity restrictions, has been clearly outlined in the discharge AVS.  Discharge Vitals:  Blood pressure 121/78, pulse 55, temperature 98 F (36.7 C), temperature source Oral, resp. rate 18, height 5\' 8"  (1.727 m), weight 79.5 kg (175 lb 4.3 oz), SpO2 100.00%.   Weight change: -  3.6 kg (-7 lb 15 oz)  Labs: Recent Labs     12/11/12  0500  12/12/12  0500  WBC  5.1  5.0  HGB  8.5*  8.8*  HCT  24.7*  25.2*  MCV  89.2  89.0  PLT  169  173   Recent Labs Lab  12/06/12 0647  12/10/12 0555 12/11/12 0500 12/12/12 0500  NA 140  < > 140 141 138  K 3.9  < > 3.5 3.6 3.7  CL 108  < > 105 107 108  CO2 20  < > 22 22 20   BUN 29*  < > 43* 41* 32*  CREATININE 2.23*  < > 3.09* 2.86* 2.56*  CALCIUM 7.7*  < > 8.6 8.3* 8.4  PROT 5.1*  --   --   --   --   BILITOT 0.3  --   --   --   --   ALKPHOS 97  --   --   --   --   ALT 59*  --   --   --   --   AST 92*  --   --   --   --   GLUCOSE 180*  < > 117* 102* 100*  < > = values in this interval not displayed.  Disposition:  Discharge Orders   Future Appointments Provider Department Dept Phone   12/18/2012 8:40 AM [facility] Main Office [place]) [phone]   12/18/2012 9:00 AM Kathleene Hazel, MD [facility] Main Office [place]) [phone]   Future Orders Complete By Expires     Amb Referral to Cardiac Rehabilitation  As directed     Comments:      Referring to [facility] Phase 2    Diet - low sodium heart healthy  As directed     Increase activity slowly  As directed       Follow-up Information   Follow up with Verne Carrow, MD On 12/18/2012. (At 9:00 AM for post-hospital follow-up and labwork. )    Contact information:   [address] [place] [address] [phone]       Follow up with Crawford Givens, MD. Schedule an appointment as soon as possible for a visit in 1 week. (For post-hospital follow-up and management of diabetes. )    Contact information:   [address] [place] [address] [phone]       Discharge Medications:    Medication List    TAKE these medications       aspirin EC 81 MG tablet  Take 81 mg by mouth daily.     atorvastatin 40 MG tablet  Commonly known as:  LIPITOR  Take 40 mg by mouth every Monday, Wednesday, and Friday.     DUREZOL 0.05 % Emul  Generic drug:  Difluprednate  Place 1 drop into the right eye every 3 (three) hours.     furosemide 40 MG tablet  Commonly known as:  LASIX    Take 40 mg by mouth daily.     hydrALAZINE 50 MG tablet  Commonly known as:  APRESOLINE  Take 50 mg by mouth 4 (four) times daily.     levothyroxine 125 MCG tablet  Commonly known as:  SYNTHROID, LEVOTHROID  Take 125 mcg by mouth daily.     losartan 25 MG tablet  Commonly known as:  COZAAR  Take 25 mg by mouth 2 (two) times daily.     metoprolol tartrate 25 MG  tablet  Commonly known as:  LOPRESSOR  Take 1 tablet (25 mg total) by mouth 2 (two) times daily.     mycophenolate 180 MG EC tablet  Commonly known as:  MYFORTIC  Take 360 mg by mouth 2 (two) times daily.     nitroGLYCERIN 0.4 MG SL tablet  Commonly known as:  NITROSTAT  Place 1 tablet (0.4 mg total) under the tongue every 5 (five) minutes x 3 doses as needed for chest pain.     prednisoLONE acetate 1 % ophthalmic suspension  Commonly known as:  PRED FORTE  Place 1 drop into the right eye every 2 (two) hours while awake.     RA IRON 27 MG Tabs  Generic drug:  Ferrous Sulfate  Take 1 tablet by mouth 2 (two) times daily.     ranitidine 150 MG tablet  Commonly known as:  ZANTAC  Take 150 mg by mouth daily.     sertraline 100 MG tablet  Commonly known as:  ZOLOFT  Take 100 mg by mouth daily.     SLOW-MAG PO  Take 1 tablet by mouth 2 (two) times daily.     sodium bicarbonate 650 MG tablet  Take 650 mg by mouth 3 (three) times daily.     SYSTANE BALANCE OP  Place 1 drop into both eyes 4 (four) times daily.     tacrolimus 1 MG capsule  Commonly known as:  PROGRAF  Take 3 mg by mouth 2 (two) times daily.     Ticagrelor 90 MG Tabs tablet  Commonly known as:  BRILINTA  Take 1 tablet (90 mg total) by mouth 2 (two) times daily.       Outstanding Labs/Studies: BMET, CBC on 12/18/12  Duration of Discharge Encounter: Greater than 30 minutes including physician time.  Signed, R. Hurman Horn, PA-C 12/12/2012, 9:09 AM

## 2012-12-12 NOTE — Progress Notes (Signed)
SUBJECTIVE: No complaints. No chest pain or SOB  Tele: sinus brady  BP 121/78  Pulse 55  Temp(Src) 98 F (36.7 C) (Oral)  Resp 18  Ht 5\' 8"  (1.727 m)  Wt 175 lb 4.3 oz (79.5 kg)  BMI 26.66 kg/m2  SpO2 100%  Intake/Output Summary (Last 24 hours) at 12/12/12 0734 Last data filed at 12/12/12 1610  Gross per 24 hour  Intake    480 ml  Output   1650 ml  Net  -1170 ml    PHYSICAL EXAM General: Well developed, well nourished, in no acute distress. Alert and oriented x 3.  Psych:  Good affect, responds appropriately Neck: No JVD. No masses noted.  Lungs: Clear bilaterally with no wheezes or rhonci noted.  Heart: Bradycardic with no murmurs noted. Abdomen: Bowel sounds are present. Soft, non-tender.  Extremities: No lower extremity edema.   LABS: Basic Metabolic Panel:  Recent Labs  96/04/54 0500 12/12/12 0500  NA 141 138  K 3.6 3.7  CL 107 108  CO2 22 20  GLUCOSE 102* 100*  BUN 41* 32*  CREATININE 2.86* 2.56*  CALCIUM 8.3* 8.4   CBC:  Recent Labs  12/11/12 0500 12/12/12 0500  WBC 5.1 5.0  HGB 8.5* 8.8*  HCT 24.7* 25.2*  MCV 89.2 89.0  PLT 169 173   Current Meds: . aspirin  81 mg Oral Daily  . atorvastatin  40 mg Oral q1800  . famotidine  20 mg Oral Daily  . hydrALAZINE  50 mg Oral QID  . levothyroxine  125 mcg Oral QAC breakfast  . metoprolol tartrate  25 mg Oral BID  . mycophenolate  360 mg Oral BID  . potassium chloride  20 mEq Oral Once  . sodium chloride  10-40 mL Intracatheter Q12H  . tacrolimus  3 mg Oral BID  . Ticagrelor  90 mg Per NG tube BID     ASSESSMENT AND PLAN: 67 yo male with history of DM, HTN, HLD, chronic kidney disease s/p kidney transplant, moderately severe CAD by cath 2004 admitted with anterior STEMI complicated by incessant VF during cath evening of 12/05/12.   1. Anterior STEMI complicated by incessant VF and cardiogenic shock: s/p DES x 1 proximal LAD. Small non-dominant RCA was occluded at beginning of case and  reperfused with medical therapy during case. Echo 12/06/12 with preserved LV systolic function. He is currently hemodynamically stable.Continue ASA/Brilinta for at least one year. Continue statin and beta blocker. We have held his ARB during hospital stay post contrast while watching his renal function. Will resume today. Will discharge on home dose of Lipitor 40 mg po QHS. He is ambulating and doing well with cardiac rehab.   2. Chronic kidney disease, stage III s/p kidney transplant: Stable post cath. Nephrology consult team has signed off.   3. DM: Blood sugars have been well controlled. He denies having ever had elevated blood sugars as an outpatient.   4. Bradycardia: He has been amiodarone given VF during cath. No arrythmias over last 96 hours. Will d/c amiodarone.   5. HTN: Will restart his home dose of Cozaar 25 mg po BID and continue home dose of Hydralazine 50 mg po QID. Continue metoprolol but will discharge home on BID dosing as it appears he had been taking once per day before admission.   6. Hypokalemia: Will replace with KDur 20 meq po x 1 again this am.   7. Anemia: Stable.  8. Dispo: Discharge home today. He will need  Brilinta starter packet. He can f/u with me or Tereso Newcomer, PA-C in 2-3 weeks. New medication is Brilinta 90 mg po BID and medication change is increasing Lopressor to 25 mg po BID (he had been taking once daily at home)      [facility]  4/2/20147:34 AM

## 2012-12-12 NOTE — Telephone Encounter (Signed)
Pt currently in hospital. Will call pt tomorrow

## 2012-12-12 NOTE — Discharge Summary (Signed)
See full note this am. cdm 

## 2012-12-13 NOTE — Telephone Encounter (Signed)
Spoke with pt. He reports he was able to get all his medications and is taking as ordered. He reports he is doing well and has no questions. He is aware of appt with Dr. Clifton James on December 18, 2012 at 9:00 and is planning on being here for appt.

## 2012-12-18 ENCOUNTER — Ambulatory Visit (INDEPENDENT_AMBULATORY_CARE_PROVIDER_SITE_OTHER): Payer: Medicare Other | Admitting: Cardiovascular Disease

## 2012-12-18 ENCOUNTER — Encounter: Payer: Self-pay | Admitting: Cardiovascular Disease

## 2012-12-18 ENCOUNTER — Ambulatory Visit (INDEPENDENT_AMBULATORY_CARE_PROVIDER_SITE_OTHER): Payer: Medicare Other | Admitting: *Deleted

## 2012-12-18 VITALS — BP 142/86 | HR 66 | Ht 69.0 in | Wt 177.0 lb

## 2012-12-18 DIAGNOSIS — I10 Essential (primary) hypertension: Secondary | ICD-10-CM | POA: Diagnosis not present

## 2012-12-18 DIAGNOSIS — N184 Chronic kidney disease, stage 4 (severe): Secondary | ICD-10-CM | POA: Diagnosis not present

## 2012-12-18 DIAGNOSIS — Z940 Kidney transplant status: Secondary | ICD-10-CM

## 2012-12-18 DIAGNOSIS — D649 Anemia, unspecified: Secondary | ICD-10-CM

## 2012-12-18 DIAGNOSIS — E876 Hypokalemia: Secondary | ICD-10-CM | POA: Diagnosis not present

## 2012-12-18 DIAGNOSIS — I2510 Atherosclerotic heart disease of native coronary artery without angina pectoris: Secondary | ICD-10-CM | POA: Diagnosis not present

## 2012-12-18 LAB — BASIC METABOLIC PANEL
Calcium: 8.9 mg/dL (ref 8.4–10.5)
GFR: 24.77 mL/min — ABNORMAL LOW (ref >60.00)
Glucose, Bld: 115 mg/dL — ABNORMAL HIGH (ref 70–99)
Sodium: 136 mEq/L (ref 135–145)

## 2012-12-18 LAB — CBC WITH DIFFERENTIAL/PLATELET
Basophils Absolute: 0 10*3/uL (ref 0.0–0.1)
Hemoglobin: 11.1 g/dL — ABNORMAL LOW (ref 13.0–17.0)
Lymphocytes Relative: 9.6 % — ABNORMAL LOW (ref 12.0–46.0)
Monocytes Relative: 10.1 % (ref 3.0–12.0)
Neutro Abs: 5.7 10*3/uL (ref 1.4–7.7)
Platelets: 312 10*3/uL (ref 150.0–400.0)
RDW: 14 % (ref 11.5–14.6)
WBC: 7.4 10*3/uL (ref 4.5–10.5)

## 2012-12-18 NOTE — Progress Notes (Signed)
History of Present Illness: 67 yo male with history of CAD, chronic stage III kidney disease s/p kidney transplant who is here today for hospital follow up. He was admitted to [facility] 12/05/12 with anterior STEMI complicated by ventricular fibrillation and cardiogenic shock. A drug eluting eluting stent (3.0 x 38 mm Promus Premier post-dilated with a 3.5 [place] balloon) was placed in the LAD. Small non-dominant RCA was occluded at beginning of case and reperfused with medical therapy during case. An IABP was placed and he required support with pressor agents for 48 hours post cath. He was placed on amiodarone but this was stopped before discharge secondary to bradycardia.  Echo 12/06/12 with preserved LV systolic function. Cath also showed a moderately severe stenosis in a moderate caliber intermediate branch.   He is here today for follow up. He is feeling well. No chest pain or SOB.   Primary Care Physician: Crawford Givens Nephrology: Dr. Caro Laroche at [facility]  Last Lipid Profile:Lipid Panel     Component Value Date/Time   CHOL 114 12/06/2012 0647   TRIG 224* 12/06/2012 0647   HDL 23* 12/06/2012 0647   CHOLHDL 5.0 12/06/2012 0647   VLDL 45* 12/06/2012 0647   LDLCALC 46 12/06/2012 0647     Past Medical History  Diagnosis Date  . CAD (coronary artery disease)   . Hypertension   . Hypothyroidism   . Benign prostatic hypertrophy     a. Nonobstructive on 2004 cath b. anterior STEMI s/p DES-pLAD 12/05/12  . Hyperlipidemia   . Diabetes mellitus without complication   . Stroke 2009  . GERD (gastroesophageal reflux disease)   . Cancer     skin cancer  . History of shingles 08/2012    Lt eye shingles  . 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 Creat was 6 when he rec'd a deceased donor transplant 01/31/12at [facility].  According to patient he did OK then in the first few weeks after surgery had to go back in hospital for "BP" problems that "messed the kidney up". Since  then creatinine has been in 2.3-3.0 range.  Sees only [facility] nephrologists, gets most care there.  Takes prograf and Myfortic, no steroids.  PKD kidneys were left in place.    . Chronic kidney disease (CKD), stage IV (severe) 12/08/2012    Of renal transplant, done 01-31-2012at [facility]   . Cardiac arrest 12/05/2012    In the setting of anterior STEMI    Past Surgical History  Procedure Laterality Date  . Kidney transplant      Oct 12, 2010  . Other surgical history  11/21/12    cancerous cells removed from mid upper chest  . Eye surgery    . Other surgical history N/A 11/15/2012    pt had skin cancer removed from mid upper chest  . Coronary angioplasty with stent placement  12/05/2012    30% oLAD, 20% mLAD, 95-99% pLAD s/p DES, 60% oD1, 70-80% pRamus, 40% pLCx, 50% AV groove Cx, 50% PLB, 100% mRCA s/p recanalization intra-procedurally, noted to be small non-dominant with 70% pRCA stenosis post-procedure  . Cardiac catheterization  2004    Nonobstructive    Current Outpatient Prescriptions  Medication Sig Dispense Refill  . aspirin EC 81 MG tablet Take 81 mg by mouth daily.      Marland Kitchen atorvastatin (LIPITOR) 40 MG tablet Take 40 mg by mouth every Monday, Wednesday, and Friday.      . Difluprednate (  DUREZOL) 0.05 % EMUL Place 1 drop into the right eye every 3 (three) hours.      . Ferrous Sulfate (RA IRON) 27 MG TABS Take 1 tablet by mouth 2 (two) times daily.      . furosemide (LASIX) 40 MG tablet Take 40 mg by mouth daily.      . hydrALAZINE (APRESOLINE) 50 MG tablet Take 50 mg by mouth 4 (four) times daily.      Marland Kitchen levothyroxine (SYNTHROID, LEVOTHROID) 125 MCG tablet Take 125 mcg by mouth daily.      Marland Kitchen losartan (COZAAR) 25 MG tablet Take 25 mg by mouth 2 (two) times daily.       . Magnesium Chloride (SLOW-MAG PO) Take 1 tablet by mouth 2 (two) times daily.      . metoprolol tartrate (LOPRESSOR) 25 MG tablet Take 1 tablet (25 mg total) by mouth 2 (two) times daily.  60 tablet  3  . mycophenolate (MYFORTIC)  180 MG EC tablet Take 360 mg by mouth 2 (two) times daily.      . nitroGLYCERIN (NITROSTAT) 0.4 MG SL tablet Place 1 tablet (0.4 mg total) under the tongue every 5 (five) minutes x 3 doses as needed for chest pain.  25 tablet  3  . prednisoLONE acetate (PRED FORTE) 1 % ophthalmic suspension Place 1 drop into the right eye every 2 (two) hours while awake.      Marland Kitchen Propylene Glycol (SYSTANE BALANCE OP) Place 1 drop into both eyes 4 (four) times daily.      . ranitidine (ZANTAC) 150 MG tablet Take 150 mg by mouth daily.      . sertraline (ZOLOFT) 100 MG tablet Take 100 mg by mouth daily.      . sodium bicarbonate 650 MG tablet Take 650 mg by mouth 3 (three) times daily.       . tacrolimus (PROGRAF) 1 MG capsule Take 3 mg by mouth 2 (two) times daily.      . Ticagrelor (BRILINTA) 90 MG TABS tablet Take 1 tablet (90 mg total) by mouth 2 (two) times daily.  60 tablet  3  . valACYclovir (VALTREX) 500 MG tablet Take one tablet  daily       No current facility-administered medications for this visit.    No Known Allergies  History   Social History  . Marital Status: Married    Spouse Name: N/A    Number of Children: N/A  . Years of Education: N/A   Occupational History  . [employer] as a Nurse, adult     retired now totally  . part owner in a [employer]    Social History Main Topics  . Smoking status: Never Smoker   . Smokeless tobacco: Never Used  . Alcohol Use: No  . Drug Use: No  . Sexually Active: Yes   Other Topics Concern  . Not on file   Social History Narrative   Running lawn care service, part time.     Married 1966    Family History  Problem Relation Age of Onset  . Kidney failure Mother     was on dialysis 3 times a week when she died  . Alcohol abuse Mother   . Hypertension Father   . Heart attack Sister   . Kidney disease Sister   . Aneurysm Sister   . COPD Sister   . Colon cancer Neg Hx   . Prostate cancer Neg Hx     Review of Systems:  As stated in  the HPI and otherwise negative.   BP 142/86  Pulse 66  Ht 5\' 9"  (1.753 m)  Wt 177 lb (80.287 kg)  BMI 26.13 kg/m2  SpO2 99%  Physical Examination: General: Well developed, well nourished, NAD HEENT: OP clear, mucus membranes moist SKIN: warm, dry. No rashes. Neuro: No focal deficits Musculoskeletal: Muscle strength 5/5 all ext Psychiatric: Mood and affect normal Neck: No JVD, no carotid bruits, no thyromegaly, no lymphadenopathy. Lungs:Clear bilaterally, no wheezes, rhonci, crackles Cardiovascular: Regular rate and rhythm. No murmurs, gallops or rubs. Abdomen:Soft. Bowel sounds present. Non-tender.  Extremities: No lower extremity edema. Pulses are 2 + in the bilateral DP/PT.  EKG:  Cardiac cath 12/05/12: Left main: 30% ostial stenosis. 20% mid stenosis.  Left Anterior Descending Artery: Large caliber vessel that courses to the apex. The proximal vessel is hazy with diffuse 95-99% stenosis extending from the ostium down to the first diagonal branch. The remainder of the LAD has no flow limiting disease. The diagonal branch is patent ostial 60% stenosis.  Ramus Intermediate: Moderate caliber vessel with 70-80% proximal stenosis.  Circumflex Artery: Large caliber, dominant vessel with 40% proximal stenosis. The first obtuse marginal branch is small in caliber with no obstructive disease. The second obtuse marginal branch is small with no disease. The distal AV groove Circumflex has a focal 50% stenosis. The left posterolateral branch has 50% stenosis.  Right Coronary Artery: Small caliber (1.75 mm) non-dominant vessel with 100% mid occlusion. (This vessel recanalized during the procedure and at the end of the procedure was noted to be small, non-dominant with a proximal 70% stenosis.   Echo 12/06/12: Left ventricle: The cavity size was normal. Wall thickness was increased in a pattern of mild LVH. Systolic function was normal. The estimated ejection fraction was in the range of 60% to  65%. Wall motion was normal; there were no regional wall motion abnormalities. - Right ventricle: The cavity size was mildly dilated. Systolic function was mildly reduced.  Assessment and Plan:   1. CAD: Recent anterior STEMI complicated by incessant VF and cardiogenic shock: s/p DES x 1 proximal LAD. Small non-dominant RCA was occluded at beginning of case and reperfused with medical therapy during case. Echo 12/06/12 with preserved LV systolic function. Continue ASA/Brilinta for at least one year. Continue statin and beta blocker/ARB/statin.   2. Chronic kidney disease, stage III s/p kidney transplant: Stable post cath. Check BMET today.   3. HTN: BP controlled.

## 2012-12-18 NOTE — Patient Instructions (Addendum)
Your physician wants you to follow-up in: 3 months. You will receive a reminder letter in the mail two months in advance. If you don't receive a letter, please call our office to schedule the follow-up appointment.   

## 2012-12-19 DIAGNOSIS — L908 Other atrophic disorders of skin: Secondary | ICD-10-CM | POA: Diagnosis not present

## 2012-12-19 DIAGNOSIS — I789 Disease of capillaries, unspecified: Secondary | ICD-10-CM | POA: Diagnosis not present

## 2012-12-19 DIAGNOSIS — L918 Other hypertrophic disorders of the skin: Secondary | ICD-10-CM | POA: Diagnosis not present

## 2012-12-19 DIAGNOSIS — C44211 Basal cell carcinoma of skin of unspecified ear and external auricular canal: Secondary | ICD-10-CM | POA: Diagnosis not present

## 2012-12-19 DIAGNOSIS — Z85828 Personal history of other malignant neoplasm of skin: Secondary | ICD-10-CM | POA: Diagnosis not present

## 2013-01-01 NOTE — Progress Notes (Signed)
Patient seen and examined, agree with above note.  I dictated the care and orders written for this patient under my direction.  Wesam G Yacoub, MD [phone] 

## 2013-01-08 ENCOUNTER — Encounter: Payer: Self-pay | Admitting: Family Medicine

## 2013-01-08 ENCOUNTER — Ambulatory Visit (INDEPENDENT_AMBULATORY_CARE_PROVIDER_SITE_OTHER): Payer: Medicare Other | Admitting: Family Medicine

## 2013-01-08 VITALS — BP 130/76 | HR 61 | Temp 97.9°F | Wt 184.0 lb

## 2013-01-08 DIAGNOSIS — I2109 ST elevation (STEMI) myocardial infarction involving other coronary artery of anterior wall: Secondary | ICD-10-CM

## 2013-01-09 ENCOUNTER — Telehealth: Payer: Self-pay | Admitting: Family Medicine

## 2013-01-09 ENCOUNTER — Encounter: Payer: Self-pay | Admitting: Family Medicine

## 2013-01-09 NOTE — Telephone Encounter (Signed)
Please notify pt. I talked with the cardiology clinic.  Edward Oconnell agreed that this was unpredictable and he had had appropriate care up to (and since) the event.  He is optimistic about his condition from this point onward, as am I.

## 2013-01-09 NOTE — Progress Notes (Signed)
Pt came in to discuss recent event and possibly decreasing another similar event.  In brief he is a 67 yo male with history of CAD, chronic stage III kidney disease s/p kidney transplant.  He was admitted to [facility] 12/05/12 with anterior STEMI complicated by ventricular fibrillation and cardiogenic shock. A drug eluting eluting stent (3.0 x 38 mm Promus Premier post-dilated with a 3.5 [place] balloon) was placed in the LAD. Small non-dominant RCA was occluded at beginning of case and reperfused with medical therapy during case. An IABP was placed and he required support with pressor agents for 48 hours post cath. He was placed on amiodarone but this was stopped before discharge secondary to bradycardia. Echo 12/06/12 with preserved LV systolic function. Cath also showed a moderately severe stenosis in a moderate caliber intermediate branch.   We talked about his prev history.  He had t chol <200 with LDL of ~70, A1c <6.5 and BP had been monitored.  We talked about off of this along with his prev renal disease.    Meds, vitals, and allergies reviewed.   ROS: See HPI.  Otherwise, noncontributory.  nad Remainder of exam deferred.

## 2013-01-09 NOTE — Assessment & Plan Note (Signed)
We reviewed his case but I am not aware of any testing that could have been done here at [facility] as his prev routine visit to prevent this.  I will discuss with cards.  He understood.  I app help of all involved.  >15 min spent with face to face with patient, >50% counseling and/or coordinating care.

## 2013-01-10 DIAGNOSIS — I129 Hypertensive chronic kidney disease with stage 1 through stage 4 chronic kidney disease, or unspecified chronic kidney disease: Secondary | ICD-10-CM | POA: Diagnosis not present

## 2013-01-10 DIAGNOSIS — I2510 Atherosclerotic heart disease of native coronary artery without angina pectoris: Secondary | ICD-10-CM | POA: Diagnosis not present

## 2013-01-10 DIAGNOSIS — Z9861 Coronary angioplasty status: Secondary | ICD-10-CM | POA: Diagnosis not present

## 2013-01-10 DIAGNOSIS — T8610 Unspecified complication of kidney transplant: Secondary | ICD-10-CM | POA: Diagnosis not present

## 2013-01-10 DIAGNOSIS — N185 Chronic kidney disease, stage 5: Secondary | ICD-10-CM | POA: Diagnosis not present

## 2013-01-10 DIAGNOSIS — I120 Hypertensive chronic kidney disease with stage 5 chronic kidney disease or end stage renal disease: Secondary | ICD-10-CM | POA: Diagnosis not present

## 2013-01-10 DIAGNOSIS — Z79899 Other long term (current) drug therapy: Secondary | ICD-10-CM | POA: Diagnosis not present

## 2013-01-10 DIAGNOSIS — Z940 Kidney transplant status: Secondary | ICD-10-CM | POA: Diagnosis not present

## 2013-01-10 NOTE — Telephone Encounter (Signed)
Patient advised.

## 2013-01-14 DIAGNOSIS — B0232 Zoster iridocyclitis: Secondary | ICD-10-CM | POA: Diagnosis not present

## 2013-02-18 DIAGNOSIS — B0232 Zoster iridocyclitis: Secondary | ICD-10-CM | POA: Diagnosis not present

## 2013-02-22 DIAGNOSIS — D485 Neoplasm of uncertain behavior of skin: Secondary | ICD-10-CM | POA: Diagnosis not present

## 2013-02-22 DIAGNOSIS — Z85828 Personal history of other malignant neoplasm of skin: Secondary | ICD-10-CM | POA: Diagnosis not present

## 2013-02-22 DIAGNOSIS — L570 Actinic keratosis: Secondary | ICD-10-CM | POA: Diagnosis not present

## 2013-02-22 DIAGNOSIS — D492 Neoplasm of unspecified behavior of bone, soft tissue, and skin: Secondary | ICD-10-CM | POA: Diagnosis not present

## 2013-03-05 DIAGNOSIS — H169 Unspecified keratitis: Secondary | ICD-10-CM | POA: Diagnosis not present

## 2013-03-06 DIAGNOSIS — Z940 Kidney transplant status: Secondary | ICD-10-CM | POA: Diagnosis not present

## 2013-03-06 DIAGNOSIS — B0232 Zoster iridocyclitis: Secondary | ICD-10-CM | POA: Diagnosis not present

## 2013-03-07 DIAGNOSIS — C44320 Squamous cell carcinoma of skin of unspecified parts of face: Secondary | ICD-10-CM | POA: Diagnosis not present

## 2013-03-08 DIAGNOSIS — B0232 Zoster iridocyclitis: Secondary | ICD-10-CM | POA: Diagnosis not present

## 2013-03-19 DIAGNOSIS — B0232 Zoster iridocyclitis: Secondary | ICD-10-CM | POA: Diagnosis not present

## 2013-03-21 ENCOUNTER — Ambulatory Visit (INDEPENDENT_AMBULATORY_CARE_PROVIDER_SITE_OTHER): Payer: Medicare Other | Admitting: Cardiovascular Disease

## 2013-03-21 ENCOUNTER — Encounter: Payer: Self-pay | Admitting: Cardiovascular Disease

## 2013-03-21 VITALS — BP 118/80 | HR 64 | Wt 175.0 lb

## 2013-03-21 DIAGNOSIS — I10 Essential (primary) hypertension: Secondary | ICD-10-CM | POA: Diagnosis not present

## 2013-03-21 DIAGNOSIS — I2510 Atherosclerotic heart disease of native coronary artery without angina pectoris: Secondary | ICD-10-CM | POA: Diagnosis not present

## 2013-03-21 NOTE — Progress Notes (Signed)
History of Present Illness: 67 yo male with history of CAD, chronic stage III kidney disease s/p kidney transplant who is here today for hospital follow up. He was admitted to [facility] 12/05/12 with anterior STEMI complicated by ventricular fibrillation and cardiogenic shock. A drug eluting eluting stent (3.0 x 38 mm Promus Premier post-dilated with a 3.5 [place] balloon) was placed in the LAD. Small non-dominant RCA was occluded at beginning of case and reperfused with medical therapy during case. An IABP was placed and he required support with pressor agents for 48 hours post cath. He was placed on amiodarone but this was stopped before discharge secondary to bradycardia. Echo 12/06/12 with preserved LV systolic function. Cath also showed a moderately severe stenosis in a moderate caliber intermediate branch.   He is here today for follow up. He is feeling well. No chest pain or SOB. He has more energy. He is back at work. Last seen in Nephrology June 2014 and creatinine was stable at 2.7. He did have a skin cancer removed from his face last week.    Primary Care Physician: Crawford Givens  Nephrology: Dr. Caro Laroche at [facility]  Last Lipid Profile:Lipid Panel     Component Value Date/Time   CHOL 114 12/06/2012 0647   TRIG 224* 12/06/2012 0647   HDL 23* 12/06/2012 0647   CHOLHDL 5.0 12/06/2012 0647   VLDL 45* 12/06/2012 0647   LDLCALC 46 12/06/2012 0647     Past Medical History  Diagnosis Date  . CAD (coronary artery disease)   . Hypertension   . Hypothyroidism   . Benign prostatic hypertrophy     a. Nonobstructive on 2004 cath b. anterior STEMI s/p DES-pLAD 12/05/12  . Hyperlipidemia   . Diabetes mellitus without complication   . Stroke 2009  . GERD (gastroesophageal reflux disease)   . Cancer     skin cancer  . History of shingles 08/2012    Lt eye shingles  . 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 Creat was 6 when he rec'd a deceased donor transplant  2012-01-11at [facility].  According to patient he did OK then in the first few weeks after surgery had to go back in hospital for "BP" problems that "messed the kidney up". Since then creatinine has been in 2.3-3.0 range.  Sees only [facility] nephrologists, gets most care there.  Takes prograf and Myfortic, no steroids.  PKD kidneys were left in place.    . Chronic kidney disease (CKD), stage IV (severe) 12/08/2012    Of renal transplant, done January 11, 2012at [facility]   . Cardiac arrest 12/05/2012    In the setting of anterior STEMI    Past Surgical History  Procedure Laterality Date  . Kidney transplant      2010/09/22  . Other surgical history  11/21/12    cancerous cells removed from mid upper chest  . Eye surgery    . Other surgical history N/A 11/15/2012    pt had skin cancer removed from mid upper chest  . Coronary angioplasty with stent placement  12/05/2012    30% oLAD, 20% mLAD, 95-99% pLAD s/p DES, 60% oD1, 70-80% pRamus, 40% pLCx, 50% AV groove Cx, 50% PLB, 100% mRCA s/p recanalization intra-procedurally, noted to be small non-dominant with 70% pRCA stenosis post-procedure  . Cardiac catheterization  2004    Nonobstructive    Current Outpatient Prescriptions  Medication Sig Dispense Refill  . aspirin EC 81 MG tablet  Take 81 mg by mouth daily.      Marland Kitchen atorvastatin (LIPITOR) 40 MG tablet Take 40 mg by mouth every Monday, Wednesday, and Friday.      . Difluprednate (DUREZOL) 0.05 % EMUL Place 1 drop into the right eye every 3 (three) hours.      . Ferrous Sulfate (RA IRON) 27 MG TABS Take 1 tablet by mouth 2 (two) times daily.      . furosemide (LASIX) 40 MG tablet Take 40 mg by mouth daily.      . hydrALAZINE (APRESOLINE) 50 MG tablet Take 50 mg by mouth 4 (four) times daily.      Marland Kitchen levothyroxine (SYNTHROID, LEVOTHROID) 125 MCG tablet Take 125 mcg by mouth daily.      Marland Kitchen losartan (COZAAR) 25 MG tablet Take 25 mg by mouth 2 (two) times daily.       . Magnesium Chloride (SLOW-MAG PO) Take 1 tablet by mouth 2  (two) times daily.      . metoprolol succinate (TOPROL-XL) 25 MG 24 hr tablet Take 25 mg by mouth daily.      . mycophenolate (MYFORTIC) 180 MG EC tablet Take 180 mg by mouth 4 (four) times daily.       . nitroGLYCERIN (NITROSTAT) 0.4 MG SL tablet Place 1 tablet (0.4 mg total) under the tongue every 5 (five) minutes x 3 doses as needed for chest pain.  25 tablet  3  . prednisoLONE acetate (PRED FORTE) 1 % ophthalmic suspension Place 1 drop into the right eye every 2 (two) hours while awake.      Marland Kitchen Propylene Glycol (SYSTANE BALANCE OP) Place 1 drop into both eyes 4 (four) times daily.      . ranitidine (ZANTAC) 150 MG tablet Take 150 mg by mouth daily.      . sertraline (ZOLOFT) 100 MG tablet Take 100 mg by mouth daily.      . sodium bicarbonate 650 MG tablet Take 650 mg by mouth 3 (three) times daily.       . tacrolimus (PROGRAF) 1 MG capsule Take 3 mg by mouth 2 (two) times daily.      . Ticagrelor (BRILINTA) 90 MG TABS tablet Take 1 tablet (90 mg total) by mouth 2 (two) times daily.  60 tablet  3   No current facility-administered medications for this visit.    No Known Allergies  History   Social History  . Marital Status: Married    Spouse Name: N/A    Number of Children: N/A  . Years of Education: N/A   Occupational History  . [employer] as a Nurse, adult     retired now totally  . part owner in a [employer]    Social History Main Topics  . Smoking status: Never Smoker   . Smokeless tobacco: Never Used  . Alcohol Use: No  . Drug Use: No  . Sexually Active: Yes   Other Topics Concern  . Not on file   Social History Narrative   Running lawn care service, part time.     Married 1966    Family History  Problem Relation Age of Onset  . Kidney failure Mother     was on dialysis 3 times a week when she died  . Alcohol abuse Mother   . Hypertension Father   . Heart attack Sister   . Kidney disease Sister   . Aneurysm Sister   . COPD Sister   . Colon cancer  Neg Hx   .  Prostate cancer Neg Hx     Review of Systems:  As stated in the HPI and otherwise negative.   BP 118/80  Pulse 64  Wt 175 lb (79.379 kg)  BMI 25.83 kg/m2  Physical Examination: General: Well developed, well nourished, NAD HEENT: OP clear, mucus membranes moist SKIN: warm, dry. No rashes. Neuro: No focal deficits Musculoskeletal: Muscle strength 5/5 all ext Psychiatric: Mood and affect normal Neck: No JVD, no carotid bruits, no thyromegaly, no lymphadenopathy. Lungs:Clear bilaterally, no wheezes, rhonci, crackles Cardiovascular: Regular rate and rhythm. No murmurs, gallops or rubs. Abdomen:Soft. Bowel sounds present. Non-tender.  Extremities: No lower extremity edema. Pulses are 2 + in the bilateral DP/PT.   Cardiac cath 12/05/12:  Left main: 30% ostial stenosis. 20% mid stenosis.  Left Anterior Descending Artery: Large caliber vessel that courses to the apex. The proximal vessel is hazy with diffuse 95-99% stenosis extending from the ostium down to the first diagonal branch. The remainder of the LAD has no flow limiting disease. The diagonal branch is patent ostial 60% stenosis.  Ramus Intermediate: Moderate caliber vessel with 70-80% proximal stenosis.  Circumflex Artery: Large caliber, dominant vessel with 40% proximal stenosis. The first obtuse marginal branch is small in caliber with no obstructive disease. The second obtuse marginal branch is small with no disease. The distal AV groove Circumflex has a focal 50% stenosis. The left posterolateral branch has 50% stenosis.  Right Coronary Artery: Small caliber (1.75 mm) non-dominant vessel with 100% mid occlusion. (This vessel recanalized during the procedure and at the end of the procedure was noted to be small, non-dominant with a proximal 70% stenosis.   Echo 12/06/12:  Left ventricle: The cavity size was normal. Wall thickness was increased in a pattern of mild LVH. Systolic function was normal. The estimated  ejection fraction was in the range of 60% to 65%. Wall motion was normal; there were no regional wall motion abnormalities. - Right ventricle: The cavity size was mildly dilated. Systolic function was mildly reduced.  Assessment and Plan:   1. CAD: Anterior STEMI March 2014 complicated by incessant VF and cardiogenic shock: s/p DES x 1 proximal LAD. Small non-dominant RCA was occluded at beginning of case and reperfused with medical therapy during case. Moderate disease in intermediate branch which we have managed medically. Echo 12/06/12 with preserved LV systolic function. Continue ASA/Brilinta for at least one year. Continue statin, beta blocker and ARB. Will discuss stress test to evaluate intermediate branch in 6 months.   2. Chronic kidney disease, stage III s/p kidney transplant: Stable. Followed in Nephrology. Last creatinine 2.7 last month.   3. HTN: BP controlled.

## 2013-03-21 NOTE — Patient Instructions (Addendum)
Your physician wants you to follow-up in:  6 months. You will receive a reminder letter in the mail two months in advance. If you don't receive a letter, please call our office to schedule the follow-up appointment.   

## 2013-03-25 ENCOUNTER — Other Ambulatory Visit: Payer: Self-pay | Admitting: *Deleted

## 2013-03-25 MED ORDER — METOPROLOL SUCCINATE ER 25 MG PO TB24: 25.0000 mg | ORAL_TABLET | Freq: Every day | ORAL | Status: DC

## 2013-03-26 DIAGNOSIS — B0232 Zoster iridocyclitis: Secondary | ICD-10-CM | POA: Diagnosis not present

## 2013-03-28 DIAGNOSIS — B0233 Zoster keratitis: Secondary | ICD-10-CM | POA: Diagnosis not present

## 2013-04-03 DIAGNOSIS — Q613 Polycystic kidney, unspecified: Secondary | ICD-10-CM | POA: Diagnosis not present

## 2013-04-03 DIAGNOSIS — I69998 Other sequelae following unspecified cerebrovascular disease: Secondary | ICD-10-CM | POA: Diagnosis not present

## 2013-04-03 DIAGNOSIS — C4492 Squamous cell carcinoma of skin, unspecified: Secondary | ICD-10-CM | POA: Diagnosis not present

## 2013-04-03 DIAGNOSIS — M5030 Other cervical disc degeneration, unspecified cervical region: Secondary | ICD-10-CM | POA: Diagnosis not present

## 2013-04-03 DIAGNOSIS — R918 Other nonspecific abnormal finding of lung field: Secondary | ICD-10-CM | POA: Diagnosis not present

## 2013-04-03 DIAGNOSIS — N2889 Other specified disorders of kidney and ureter: Secondary | ICD-10-CM | POA: Diagnosis not present

## 2013-04-03 DIAGNOSIS — D899 Disorder involving the immune mechanism, unspecified: Secondary | ICD-10-CM | POA: Diagnosis not present

## 2013-04-03 DIAGNOSIS — C44320 Squamous cell carcinoma of skin of unspecified parts of face: Secondary | ICD-10-CM | POA: Diagnosis not present

## 2013-04-03 DIAGNOSIS — G9389 Other specified disorders of brain: Secondary | ICD-10-CM | POA: Diagnosis not present

## 2013-04-03 DIAGNOSIS — Z01810 Encounter for preprocedural cardiovascular examination: Secondary | ICD-10-CM | POA: Diagnosis not present

## 2013-04-03 DIAGNOSIS — R911 Solitary pulmonary nodule: Secondary | ICD-10-CM | POA: Diagnosis not present

## 2013-04-03 DIAGNOSIS — Z79899 Other long term (current) drug therapy: Secondary | ICD-10-CM | POA: Diagnosis not present

## 2013-04-03 DIAGNOSIS — Z940 Kidney transplant status: Secondary | ICD-10-CM | POA: Diagnosis not present

## 2013-04-03 DIAGNOSIS — Z01811 Encounter for preprocedural respiratory examination: Secondary | ICD-10-CM | POA: Diagnosis not present

## 2013-04-03 DIAGNOSIS — C4490 Unspecified malignant neoplasm of skin, unspecified: Secondary | ICD-10-CM | POA: Diagnosis not present

## 2013-04-03 DIAGNOSIS — B029 Zoster without complications: Secondary | ICD-10-CM | POA: Diagnosis not present

## 2013-04-03 DIAGNOSIS — Z85828 Personal history of other malignant neoplasm of skin: Secondary | ICD-10-CM | POA: Diagnosis not present

## 2013-04-03 DIAGNOSIS — Z01818 Encounter for other preprocedural examination: Secondary | ICD-10-CM | POA: Diagnosis not present

## 2013-04-04 ENCOUNTER — Telehealth: Payer: Self-pay | Admitting: *Deleted

## 2013-04-04 DIAGNOSIS — B0232 Zoster iridocyclitis: Secondary | ICD-10-CM | POA: Diagnosis not present

## 2013-04-04 NOTE — Telephone Encounter (Signed)
Patient called to consult with Dr. Clifton James regarding medication changes proposed by PCP. Patient prefers to talk to Dr. Clifton James prior to making medication changes. Please call at your earliest convenience - [phone]. Thank you!

## 2013-04-08 ENCOUNTER — Other Ambulatory Visit (HOSPITAL_COMMUNITY): Payer: Self-pay | Admitting: Physician Assistant

## 2013-04-08 ENCOUNTER — Other Ambulatory Visit: Payer: Self-pay | Admitting: Family Medicine

## 2013-04-08 NOTE — Telephone Encounter (Signed)
Pat, Can you touch base with him and see what medication changes are proposed? Thanks, chris

## 2013-04-09 ENCOUNTER — Telehealth: Payer: Self-pay | Admitting: Cardiovascular Disease

## 2013-04-09 DIAGNOSIS — C4490 Unspecified malignant neoplasm of skin, unspecified: Secondary | ICD-10-CM | POA: Diagnosis not present

## 2013-04-09 NOTE — Telephone Encounter (Signed)
New Prob     Pt has a questions regarding his BRILINTA prescription. Please call.

## 2013-04-09 NOTE — Telephone Encounter (Signed)
Left message to call back  

## 2013-04-10 ENCOUNTER — Telehealth: Payer: Self-pay | Admitting: Cardiovascular Disease

## 2013-04-10 NOTE — Telephone Encounter (Signed)
Spoke with pt. He reports he is starting radiation on April 29, 2013 for 6 weeks. He is also being scheduled for reconstructive facial surgery. Pt is not sure if this will be done on April 30, 2013 or postponed for 2 months.  I told pt that Dr. Clifton James had received information and EKG from surgeon and will review.  Pt also reports he had 2 episodes of heart beating fast and he could hear it. Heartbeat felt regular. Episodes lasted about 30-40 minutes and occurred when he was having several tests done at [facility].  Has not had chest pain or shortness of breath. Pt would also like to know if Dr. Clifton James would like to see pt prior to planned surgery.

## 2013-04-10 NOTE — Telephone Encounter (Signed)
Left message to call back  

## 2013-04-10 NOTE — Telephone Encounter (Signed)
Spoke with pt and this is regarding possible surgery pt may be having. See phone note dated April 10, 2013 for documentation.

## 2013-04-10 NOTE — Telephone Encounter (Signed)
Spoke with pt and gave him information from Dr. Clifton James. Appt made for pt to see Dr. Clifton James on April 22, 2013 at 9:00.

## 2013-04-10 NOTE — Telephone Encounter (Signed)
See phone note dated 7/30 for documentation. Call was regarding holding medications prior to surgery.

## 2013-04-10 NOTE — Telephone Encounter (Signed)
F/u ° ° °Pt returning your call °

## 2013-04-10 NOTE — Telephone Encounter (Signed)
Patient is scheduled for facial surgery on 04/30/13.  The physician, Dr. Azucena Fallen at [facility] noticed an abnormal heart rate on EKG wants to know if he should have the surgery.  Patient wants to know if he should make an appointment or not.

## 2013-04-10 NOTE — Telephone Encounter (Signed)
His EKG from the outside office shows sinus with PVCs which are likely contributing to his feeling of palpitations. He had an anterior MI at the end of march. He has only been on ASA/Brilinta for 4 months. It would be very high risk to stop his ASA and Brilinta at this time for surgery. We will need to see him in clinic to discuss further. Can we put him on for that day we are adding on in August? Thayer Ohm

## 2013-04-11 DIAGNOSIS — B0232 Zoster iridocyclitis: Secondary | ICD-10-CM | POA: Diagnosis not present

## 2013-04-15 DIAGNOSIS — Z510 Encounter for antineoplastic radiation therapy: Secondary | ICD-10-CM | POA: Diagnosis not present

## 2013-04-15 DIAGNOSIS — C44320 Squamous cell carcinoma of skin of unspecified parts of face: Secondary | ICD-10-CM | POA: Diagnosis not present

## 2013-04-15 DIAGNOSIS — C44390 Other specified malignant neoplasm of skin of unspecified parts of face: Secondary | ICD-10-CM | POA: Diagnosis not present

## 2013-04-18 DIAGNOSIS — B0232 Zoster iridocyclitis: Secondary | ICD-10-CM | POA: Diagnosis not present

## 2013-04-19 DIAGNOSIS — L570 Actinic keratosis: Secondary | ICD-10-CM | POA: Diagnosis not present

## 2013-04-19 DIAGNOSIS — D485 Neoplasm of uncertain behavior of skin: Secondary | ICD-10-CM | POA: Diagnosis not present

## 2013-04-19 DIAGNOSIS — Z85828 Personal history of other malignant neoplasm of skin: Secondary | ICD-10-CM | POA: Diagnosis not present

## 2013-04-22 ENCOUNTER — Encounter: Payer: Self-pay | Admitting: Cardiovascular Disease

## 2013-04-22 ENCOUNTER — Ambulatory Visit (INDEPENDENT_AMBULATORY_CARE_PROVIDER_SITE_OTHER): Payer: Medicare Other | Admitting: Cardiovascular Disease

## 2013-04-22 VITALS — BP 132/82 | HR 90 | Ht 69.0 in | Wt 175.8 lb

## 2013-04-22 DIAGNOSIS — I2109 ST elevation (STEMI) myocardial infarction involving other coronary artery of anterior wall: Secondary | ICD-10-CM

## 2013-04-22 DIAGNOSIS — I2510 Atherosclerotic heart disease of native coronary artery without angina pectoris: Secondary | ICD-10-CM

## 2013-04-22 NOTE — Patient Instructions (Addendum)
Your physician wants you to follow-up in:  6 months. You will receive a reminder letter in the mail two months in advance. If you don't receive a letter, please call our office to schedule the follow-up appointment.  Your physician has requested that you have an exercise stress myoview. For further information please visit [email or web address] Please follow instruction sheet, as given. To be done in November 2014

## 2013-04-22 NOTE — Progress Notes (Signed)
History of Present Illness: 67 yo male with history of CAD, chronic stage III kidney disease s/p kidney transplant who is here today for cardiac follow up. He was admitted to [facility] 12/05/12 with anterior STEMI complicated by ventricular fibrillation and cardiogenic shock. A drug eluting eluting stent (3.0 x 38 mm Promus Premier post-dilated with a 3.5 [place] balloon) was placed in the LAD. Small non-dominant RCA was occluded at beginning of case and reperfused with medical therapy during case. An IABP was placed and he required support with pressor agents for 48 hours post cath. He was placed on amiodarone but this was stopped before discharge secondary to bradycardia. Echo 12/06/12 with preserved LV systolic function. Cath also showed a moderately severe stenosis in a moderate caliber intermediate branch which we chose to manage medically. He is planning to have facial reconstruction surgery following skin cancer removal from face.   He is here today for follow up. He is feeling well. No chest pain or SOB. He is back at work. Last seen in Nephrology June 2014 and creatinine was stable at 2.7.   Primary Care Physician: Crawford Givens  Nephrology: Dr. Caro Laroche at [facility] ENT: Dr. [facility])  Last Lipid Profile:Lipid Panel     Component Value Date/Time   CHOL 114 12/06/2012 0647   TRIG 224* 12/06/2012 0647   HDL 23* 12/06/2012 0647   CHOLHDL 5.0 12/06/2012 0647   VLDL 45* 12/06/2012 0647   LDLCALC 46 12/06/2012 0647     Past Medical History  Diagnosis Date  . CAD (coronary artery disease)   . Hypertension   . Hypothyroidism   . Benign prostatic hypertrophy     a. Nonobstructive on 2004 cath b. anterior STEMI s/p DES-pLAD 12/05/12  . Hyperlipidemia   . Diabetes mellitus without complication   . Stroke 2009  . GERD (gastroesophageal reflux disease)   . Cancer     skin cancer  . History of shingles 08/2012    Lt eye shingles  . 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 Creat was 6 when he rec'd a deceased donor transplant 01/14/2012at [facility].  According to patient he did OK then in the first few weeks after surgery had to go back in hospital for "BP" problems that "messed the kidney up". Since then creatinine has been in 2.3-3.0 range.  Sees only [facility] nephrologists, gets most care there.  Takes prograf and Myfortic, no steroids.  PKD kidneys were left in place.    . Chronic kidney disease (CKD), stage IV (severe) 12/08/2012    Of renal transplant, done 01-14-2012at [facility]   . Cardiac arrest 12/05/2012    In the setting of anterior STEMI    Past Surgical History  Procedure Laterality Date  . Kidney transplant      2010/09/25  . Other surgical history  11/21/12    cancerous cells removed from mid upper chest  . Eye surgery    . Other surgical history N/A 11/15/2012    pt had skin cancer removed from mid upper chest  . Coronary angioplasty with stent placement  12/05/2012    30% oLAD, 20% mLAD, 95-99% pLAD s/p DES, 60% oD1, 70-80% pRamus, 40% pLCx, 50% AV groove Cx, 50% PLB, 100% mRCA s/p recanalization intra-procedurally, noted to be small non-dominant with 70% pRCA stenosis post-procedure  . Cardiac catheterization  2004    Nonobstructive    Current Outpatient Prescriptions  Medication Sig Dispense  Refill  . aspirin EC 81 MG tablet Take 81 mg by mouth daily.      Marland Kitchen atorvastatin (LIPITOR) 40 MG tablet Take 40 mg by mouth every Monday, Wednesday, and Friday.      Marland Kitchen atorvastatin (LIPITOR) 40 MG tablet TAKE ONE BY MOUTH MONDAY, WEDNESDAY AND FRIDAY  90 tablet  1  . BRILINTA 90 MG TABS tablet TAKE 1 TABLET (90 MG TOTAL) BY MOUTH 2 (TWO) TIMES DAILY.  60 tablet  3  . Difluprednate (DUREZOL) 0.05 % EMUL Place 1 drop into the right eye every 3 (three) hours.      . Ferrous Sulfate (RA IRON) 27 MG TABS Take 1 tablet by mouth 2 (two) times daily.      . furosemide (LASIX) 40 MG tablet Take 40 mg by mouth daily.      . hydrALAZINE (APRESOLINE) 50 MG tablet  Take 50 mg by mouth 4 (four) times daily.      Marland Kitchen levothyroxine (SYNTHROID, LEVOTHROID) 125 MCG tablet Take 125 mcg by mouth daily.      Marland Kitchen losartan (COZAAR) 25 MG tablet Take 25 mg by mouth 2 (two) times daily.       . Magnesium Chloride (SLOW-MAG PO) Take 1 tablet by mouth 2 (two) times daily.      . metoprolol succinate (TOPROL-XL) 25 MG 24 hr tablet Take 1 tablet (25 mg total) by mouth daily.  90 tablet  3  . mycophenolate (MYFORTIC) 180 MG EC tablet Take 180 mg by mouth 4 (four) times daily.       . nitroGLYCERIN (NITROSTAT) 0.4 MG SL tablet Place 1 tablet (0.4 mg total) under the tongue every 5 (five) minutes x 3 doses as needed for chest pain.  25 tablet  3  . prednisoLONE acetate (PRED FORTE) 1 % ophthalmic suspension Place 1 drop into the right eye every 2 (two) hours while awake.      Marland Kitchen Propylene Glycol (SYSTANE BALANCE OP) Place 1 drop into both eyes 4 (four) times daily.      . ranitidine (ZANTAC) 150 MG tablet Take 150 mg by mouth daily.      . sertraline (ZOLOFT) 100 MG tablet Take 100 mg by mouth daily.      . sodium bicarbonate 650 MG tablet Take 650 mg by mouth 3 (three) times daily.       . tacrolimus (PROGRAF) 1 MG capsule Take 3 mg by mouth 2 (two) times daily.       No current facility-administered medications for this visit.    No Known Allergies  History   Social History  . Marital Status: Married    Spouse Name: N/A    Number of Children: N/A  . Years of Education: N/A   Occupational History  . [employer] as a Nurse, adult     retired now totally  . part owner in a [employer]    Social History Main Topics  . Smoking status: Never Smoker   . Smokeless tobacco: Never Used  . Alcohol Use: No  . Drug Use: No  . Sexually Active: Yes   Other Topics Concern  . Not on file   Social History Narrative   Running lawn care service, part time.     Married 1966    Family History  Problem Relation Age of Onset  . Kidney failure Mother     was on dialysis  3 times a week when she died  . Alcohol abuse Mother   .  Hypertension Father   . Heart attack Sister   . Kidney disease Sister   . Aneurysm Sister   . COPD Sister   . Colon cancer Neg Hx   . Prostate cancer Neg Hx     Review of Systems:  As stated in the HPI and otherwise negative.   BP 132/82  Pulse 90  Ht 5\' 9"  (1.753 m)  Wt 175 lb 12.8 oz (79.742 kg)  BMI 25.95 kg/m2  Physical Examination: General: Well developed, well nourished, NAD HEENT: OP clear, mucus membranes moist SKIN: warm, dry. No rashes. Neuro: No focal deficits Musculoskeletal: Muscle strength 5/5 all ext Psychiatric: Mood and affect normal Neck: No JVD, no carotid bruits, no thyromegaly, no lymphadenopathy. Lungs:Clear bilaterally, no wheezes, rhonci, crackles Cardiovascular: Regular rate and rhythm. No murmurs, gallops or rubs. Abdomen:Soft. Bowel sounds present. Non-tender.  Extremities: No lower extremity edema. Pulses are 2 + in the bilateral DP/PT.  EKG: 04/22/13: Sinus, rate 81 bpm. PVCs.   Cardiac cath 12/05/12:  Left main: 30% ostial stenosis. 20% mid stenosis.  Left Anterior Descending Artery: Large caliber vessel that courses to the apex. The proximal vessel is hazy with diffuse 95-99% stenosis extending from the ostium down to the first diagonal branch. The remainder of the LAD has no flow limiting disease. The diagonal branch is patent ostial 60% stenosis.  Ramus Intermediate: Moderate caliber vessel with 70-80% proximal stenosis.  Circumflex Artery: Large caliber, dominant vessel with 40% proximal stenosis. The first obtuse marginal branch is small in caliber with no obstructive disease. The second obtuse marginal branch is small with no disease. The distal AV groove Circumflex has a focal 50% stenosis. The left posterolateral branch has 50% stenosis.  Right Coronary Artery: Small caliber (1.75 mm) non-dominant vessel with 100% mid occlusion. (This vessel recanalized during the procedure and at the  end of the procedure was noted to be small, non-dominant with a proximal 70% stenosis.   Echo 12/06/12:  Left ventricle: The cavity size was normal. Wall thickness was increased in a pattern of mild LVH. Systolic function was normal. The estimated ejection fraction was in the range of 60% to 65%. Wall motion was normal; there were no regional wall motion abnormalities. - Right ventricle: The cavity size was mildly dilated. Systolic function was mildly reduced.  Assessment and Plan:   1. CAD: Anterior STEMI March 2014 complicated by incessant VF and cardiogenic shock s/p DES x 1 proximal LAD. Small non-dominant RCA was occluded at beginning of case and reperfused with medical therapy during case. Moderate disease in intermediate branch which we have managed medically. Echo 12/06/12 with preserved LV systolic function. Continue ASA/Brilinta for at least one year. Continue statin, beta blocker and ARB. Will arrange stress myoview to exclude ischemia in the distribution of the intermediate branch. He has a possible upcoming facial surgery in 2 months. I have had a long discussion with him today in regards to anti-platelet therapy. Current guidelines state that he should remain on dual anti-platelet therapy for at least 12 months following his acute MI. (ending March 2015). I would not recommend stopping the Brilinta or ASA prior to March 2015 for elective surgical procedure.  I will try to discuss this with his ENT surgeon at [facility], Dr. Kennyth Arnold and will send this note. I can be contacted at [phone] to discuss further.   2. Chronic kidney disease, stage III s/p kidney transplant: Stable. Followed in Nephrology.   3. HTN: BP controlled.

## 2013-04-23 DIAGNOSIS — H169 Unspecified keratitis: Secondary | ICD-10-CM | POA: Diagnosis not present

## 2013-04-24 DIAGNOSIS — I129 Hypertensive chronic kidney disease with stage 1 through stage 4 chronic kidney disease, or unspecified chronic kidney disease: Secondary | ICD-10-CM | POA: Diagnosis not present

## 2013-04-24 DIAGNOSIS — Z79899 Other long term (current) drug therapy: Secondary | ICD-10-CM | POA: Diagnosis not present

## 2013-04-24 DIAGNOSIS — I253 Aneurysm of heart: Secondary | ICD-10-CM | POA: Diagnosis not present

## 2013-04-24 DIAGNOSIS — Q612 Polycystic kidney, adult type: Secondary | ICD-10-CM | POA: Diagnosis not present

## 2013-04-24 DIAGNOSIS — B029 Zoster without complications: Secondary | ICD-10-CM | POA: Diagnosis not present

## 2013-04-24 DIAGNOSIS — Z48298 Encounter for aftercare following other organ transplant: Secondary | ICD-10-CM | POA: Diagnosis not present

## 2013-04-24 DIAGNOSIS — Z940 Kidney transplant status: Secondary | ICD-10-CM | POA: Diagnosis not present

## 2013-04-24 DIAGNOSIS — I2510 Atherosclerotic heart disease of native coronary artery without angina pectoris: Secondary | ICD-10-CM | POA: Diagnosis not present

## 2013-04-24 DIAGNOSIS — E039 Hypothyroidism, unspecified: Secondary | ICD-10-CM | POA: Diagnosis not present

## 2013-04-24 DIAGNOSIS — N184 Chronic kidney disease, stage 4 (severe): Secondary | ICD-10-CM | POA: Diagnosis not present

## 2013-04-24 DIAGNOSIS — I10 Essential (primary) hypertension: Secondary | ICD-10-CM | POA: Diagnosis not present

## 2013-04-24 DIAGNOSIS — E785 Hyperlipidemia, unspecified: Secondary | ICD-10-CM | POA: Diagnosis not present

## 2013-04-24 DIAGNOSIS — Z85828 Personal history of other malignant neoplasm of skin: Secondary | ICD-10-CM | POA: Diagnosis not present

## 2013-04-26 DIAGNOSIS — Z510 Encounter for antineoplastic radiation therapy: Secondary | ICD-10-CM | POA: Diagnosis not present

## 2013-04-26 DIAGNOSIS — C44390 Other specified malignant neoplasm of skin of unspecified parts of face: Secondary | ICD-10-CM | POA: Diagnosis not present

## 2013-04-26 DIAGNOSIS — C44320 Squamous cell carcinoma of skin of unspecified parts of face: Secondary | ICD-10-CM | POA: Diagnosis not present

## 2013-04-30 DIAGNOSIS — C44320 Squamous cell carcinoma of skin of unspecified parts of face: Secondary | ICD-10-CM | POA: Diagnosis not present

## 2013-04-30 DIAGNOSIS — Z510 Encounter for antineoplastic radiation therapy: Secondary | ICD-10-CM | POA: Diagnosis not present

## 2013-04-30 DIAGNOSIS — H04129 Dry eye syndrome of unspecified lacrimal gland: Secondary | ICD-10-CM | POA: Diagnosis not present

## 2013-05-01 DIAGNOSIS — C44320 Squamous cell carcinoma of skin of unspecified parts of face: Secondary | ICD-10-CM | POA: Diagnosis not present

## 2013-05-01 DIAGNOSIS — Z510 Encounter for antineoplastic radiation therapy: Secondary | ICD-10-CM | POA: Diagnosis not present

## 2013-05-02 DIAGNOSIS — B0232 Zoster iridocyclitis: Secondary | ICD-10-CM | POA: Diagnosis not present

## 2013-05-02 DIAGNOSIS — C44320 Squamous cell carcinoma of skin of unspecified parts of face: Secondary | ICD-10-CM | POA: Diagnosis not present

## 2013-05-02 DIAGNOSIS — Z510 Encounter for antineoplastic radiation therapy: Secondary | ICD-10-CM | POA: Diagnosis not present

## 2013-05-03 DIAGNOSIS — C44320 Squamous cell carcinoma of skin of unspecified parts of face: Secondary | ICD-10-CM | POA: Diagnosis not present

## 2013-05-03 DIAGNOSIS — Z510 Encounter for antineoplastic radiation therapy: Secondary | ICD-10-CM | POA: Diagnosis not present

## 2013-05-06 DIAGNOSIS — C44320 Squamous cell carcinoma of skin of unspecified parts of face: Secondary | ICD-10-CM | POA: Diagnosis not present

## 2013-05-06 DIAGNOSIS — Z510 Encounter for antineoplastic radiation therapy: Secondary | ICD-10-CM | POA: Diagnosis not present

## 2013-05-06 DIAGNOSIS — C44390 Other specified malignant neoplasm of skin of unspecified parts of face: Secondary | ICD-10-CM | POA: Diagnosis not present

## 2013-05-07 DIAGNOSIS — C44320 Squamous cell carcinoma of skin of unspecified parts of face: Secondary | ICD-10-CM | POA: Diagnosis not present

## 2013-05-07 DIAGNOSIS — Z510 Encounter for antineoplastic radiation therapy: Secondary | ICD-10-CM | POA: Diagnosis not present

## 2013-05-08 DIAGNOSIS — Z510 Encounter for antineoplastic radiation therapy: Secondary | ICD-10-CM | POA: Diagnosis not present

## 2013-05-08 DIAGNOSIS — C44320 Squamous cell carcinoma of skin of unspecified parts of face: Secondary | ICD-10-CM | POA: Diagnosis not present

## 2013-05-09 DIAGNOSIS — Z510 Encounter for antineoplastic radiation therapy: Secondary | ICD-10-CM | POA: Diagnosis not present

## 2013-05-09 DIAGNOSIS — C44320 Squamous cell carcinoma of skin of unspecified parts of face: Secondary | ICD-10-CM | POA: Diagnosis not present

## 2013-05-10 DIAGNOSIS — Z510 Encounter for antineoplastic radiation therapy: Secondary | ICD-10-CM | POA: Diagnosis not present

## 2013-05-10 DIAGNOSIS — C44320 Squamous cell carcinoma of skin of unspecified parts of face: Secondary | ICD-10-CM | POA: Diagnosis not present

## 2013-05-14 DIAGNOSIS — C44300 Unspecified malignant neoplasm of skin of unspecified part of face: Secondary | ICD-10-CM | POA: Diagnosis not present

## 2013-05-14 DIAGNOSIS — L259 Unspecified contact dermatitis, unspecified cause: Secondary | ICD-10-CM | POA: Diagnosis not present

## 2013-05-14 DIAGNOSIS — C44320 Squamous cell carcinoma of skin of unspecified parts of face: Secondary | ICD-10-CM | POA: Diagnosis not present

## 2013-05-14 DIAGNOSIS — Z510 Encounter for antineoplastic radiation therapy: Secondary | ICD-10-CM | POA: Diagnosis not present

## 2013-05-14 DIAGNOSIS — R112 Nausea with vomiting, unspecified: Secondary | ICD-10-CM | POA: Diagnosis not present

## 2013-05-15 DIAGNOSIS — B0232 Zoster iridocyclitis: Secondary | ICD-10-CM | POA: Diagnosis not present

## 2013-05-15 DIAGNOSIS — R112 Nausea with vomiting, unspecified: Secondary | ICD-10-CM | POA: Diagnosis not present

## 2013-05-15 DIAGNOSIS — L259 Unspecified contact dermatitis, unspecified cause: Secondary | ICD-10-CM | POA: Diagnosis not present

## 2013-05-15 DIAGNOSIS — Z510 Encounter for antineoplastic radiation therapy: Secondary | ICD-10-CM | POA: Diagnosis not present

## 2013-05-15 DIAGNOSIS — C44320 Squamous cell carcinoma of skin of unspecified parts of face: Secondary | ICD-10-CM | POA: Diagnosis not present

## 2013-05-16 DIAGNOSIS — L259 Unspecified contact dermatitis, unspecified cause: Secondary | ICD-10-CM | POA: Diagnosis not present

## 2013-05-16 DIAGNOSIS — Z510 Encounter for antineoplastic radiation therapy: Secondary | ICD-10-CM | POA: Diagnosis not present

## 2013-05-16 DIAGNOSIS — C44320 Squamous cell carcinoma of skin of unspecified parts of face: Secondary | ICD-10-CM | POA: Diagnosis not present

## 2013-05-16 DIAGNOSIS — R112 Nausea with vomiting, unspecified: Secondary | ICD-10-CM | POA: Diagnosis not present

## 2013-05-17 DIAGNOSIS — D045 Carcinoma in situ of skin of trunk: Secondary | ICD-10-CM | POA: Diagnosis not present

## 2013-05-17 DIAGNOSIS — C44320 Squamous cell carcinoma of skin of unspecified parts of face: Secondary | ICD-10-CM | POA: Diagnosis not present

## 2013-05-17 DIAGNOSIS — L259 Unspecified contact dermatitis, unspecified cause: Secondary | ICD-10-CM | POA: Diagnosis not present

## 2013-05-17 DIAGNOSIS — R112 Nausea with vomiting, unspecified: Secondary | ICD-10-CM | POA: Diagnosis not present

## 2013-05-17 DIAGNOSIS — Z510 Encounter for antineoplastic radiation therapy: Secondary | ICD-10-CM | POA: Diagnosis not present

## 2013-05-20 DIAGNOSIS — R112 Nausea with vomiting, unspecified: Secondary | ICD-10-CM | POA: Diagnosis not present

## 2013-05-20 DIAGNOSIS — C44320 Squamous cell carcinoma of skin of unspecified parts of face: Secondary | ICD-10-CM | POA: Diagnosis not present

## 2013-05-20 DIAGNOSIS — Z510 Encounter for antineoplastic radiation therapy: Secondary | ICD-10-CM | POA: Diagnosis not present

## 2013-05-20 DIAGNOSIS — L259 Unspecified contact dermatitis, unspecified cause: Secondary | ICD-10-CM | POA: Diagnosis not present

## 2013-05-21 DIAGNOSIS — C44300 Unspecified malignant neoplasm of skin of unspecified part of face: Secondary | ICD-10-CM | POA: Diagnosis not present

## 2013-05-21 DIAGNOSIS — Z510 Encounter for antineoplastic radiation therapy: Secondary | ICD-10-CM | POA: Diagnosis not present

## 2013-05-21 DIAGNOSIS — L259 Unspecified contact dermatitis, unspecified cause: Secondary | ICD-10-CM | POA: Diagnosis not present

## 2013-05-21 DIAGNOSIS — R112 Nausea with vomiting, unspecified: Secondary | ICD-10-CM | POA: Diagnosis not present

## 2013-05-21 DIAGNOSIS — C44320 Squamous cell carcinoma of skin of unspecified parts of face: Secondary | ICD-10-CM | POA: Diagnosis not present

## 2013-05-22 DIAGNOSIS — C44320 Squamous cell carcinoma of skin of unspecified parts of face: Secondary | ICD-10-CM | POA: Diagnosis not present

## 2013-05-22 DIAGNOSIS — H169 Unspecified keratitis: Secondary | ICD-10-CM | POA: Diagnosis not present

## 2013-05-22 DIAGNOSIS — L259 Unspecified contact dermatitis, unspecified cause: Secondary | ICD-10-CM | POA: Diagnosis not present

## 2013-05-22 DIAGNOSIS — Z510 Encounter for antineoplastic radiation therapy: Secondary | ICD-10-CM | POA: Diagnosis not present

## 2013-05-22 DIAGNOSIS — R112 Nausea with vomiting, unspecified: Secondary | ICD-10-CM | POA: Diagnosis not present

## 2013-05-23 DIAGNOSIS — L259 Unspecified contact dermatitis, unspecified cause: Secondary | ICD-10-CM | POA: Diagnosis not present

## 2013-05-23 DIAGNOSIS — C44320 Squamous cell carcinoma of skin of unspecified parts of face: Secondary | ICD-10-CM | POA: Diagnosis not present

## 2013-05-23 DIAGNOSIS — Z510 Encounter for antineoplastic radiation therapy: Secondary | ICD-10-CM | POA: Diagnosis not present

## 2013-05-23 DIAGNOSIS — R112 Nausea with vomiting, unspecified: Secondary | ICD-10-CM | POA: Diagnosis not present

## 2013-05-24 DIAGNOSIS — R112 Nausea with vomiting, unspecified: Secondary | ICD-10-CM | POA: Diagnosis not present

## 2013-05-24 DIAGNOSIS — C44320 Squamous cell carcinoma of skin of unspecified parts of face: Secondary | ICD-10-CM | POA: Diagnosis not present

## 2013-05-24 DIAGNOSIS — L259 Unspecified contact dermatitis, unspecified cause: Secondary | ICD-10-CM | POA: Diagnosis not present

## 2013-05-24 DIAGNOSIS — Z510 Encounter for antineoplastic radiation therapy: Secondary | ICD-10-CM | POA: Diagnosis not present

## 2013-05-27 DIAGNOSIS — C44320 Squamous cell carcinoma of skin of unspecified parts of face: Secondary | ICD-10-CM | POA: Diagnosis not present

## 2013-05-27 DIAGNOSIS — L259 Unspecified contact dermatitis, unspecified cause: Secondary | ICD-10-CM | POA: Diagnosis not present

## 2013-05-27 DIAGNOSIS — Z510 Encounter for antineoplastic radiation therapy: Secondary | ICD-10-CM | POA: Diagnosis not present

## 2013-05-27 DIAGNOSIS — R112 Nausea with vomiting, unspecified: Secondary | ICD-10-CM | POA: Diagnosis not present

## 2013-05-28 DIAGNOSIS — C44320 Squamous cell carcinoma of skin of unspecified parts of face: Secondary | ICD-10-CM | POA: Diagnosis not present

## 2013-05-28 DIAGNOSIS — C44300 Unspecified malignant neoplasm of skin of unspecified part of face: Secondary | ICD-10-CM | POA: Diagnosis not present

## 2013-05-28 DIAGNOSIS — Z510 Encounter for antineoplastic radiation therapy: Secondary | ICD-10-CM | POA: Diagnosis not present

## 2013-05-28 DIAGNOSIS — R112 Nausea with vomiting, unspecified: Secondary | ICD-10-CM | POA: Diagnosis not present

## 2013-05-28 DIAGNOSIS — L259 Unspecified contact dermatitis, unspecified cause: Secondary | ICD-10-CM | POA: Diagnosis not present

## 2013-05-29 DIAGNOSIS — E039 Hypothyroidism, unspecified: Secondary | ICD-10-CM | POA: Diagnosis not present

## 2013-05-29 DIAGNOSIS — Z940 Kidney transplant status: Secondary | ICD-10-CM | POA: Diagnosis not present

## 2013-05-29 DIAGNOSIS — E785 Hyperlipidemia, unspecified: Secondary | ICD-10-CM | POA: Diagnosis not present

## 2013-05-29 DIAGNOSIS — N184 Chronic kidney disease, stage 4 (severe): Secondary | ICD-10-CM | POA: Diagnosis not present

## 2013-05-29 DIAGNOSIS — Q612 Polycystic kidney, adult type: Secondary | ICD-10-CM | POA: Diagnosis not present

## 2013-05-29 DIAGNOSIS — Z85828 Personal history of other malignant neoplasm of skin: Secondary | ICD-10-CM | POA: Diagnosis not present

## 2013-05-29 DIAGNOSIS — C44320 Squamous cell carcinoma of skin of unspecified parts of face: Secondary | ICD-10-CM | POA: Diagnosis not present

## 2013-05-29 DIAGNOSIS — R930 Abnormal findings on diagnostic imaging of skull and head, not elsewhere classified: Secondary | ICD-10-CM | POA: Diagnosis not present

## 2013-05-29 DIAGNOSIS — I10 Essential (primary) hypertension: Secondary | ICD-10-CM | POA: Diagnosis not present

## 2013-05-29 DIAGNOSIS — L259 Unspecified contact dermatitis, unspecified cause: Secondary | ICD-10-CM | POA: Diagnosis not present

## 2013-05-29 DIAGNOSIS — I129 Hypertensive chronic kidney disease with stage 1 through stage 4 chronic kidney disease, or unspecified chronic kidney disease: Secondary | ICD-10-CM | POA: Diagnosis not present

## 2013-05-29 DIAGNOSIS — Z79899 Other long term (current) drug therapy: Secondary | ICD-10-CM | POA: Diagnosis not present

## 2013-05-29 DIAGNOSIS — Z48298 Encounter for aftercare following other organ transplant: Secondary | ICD-10-CM | POA: Diagnosis not present

## 2013-05-29 DIAGNOSIS — R112 Nausea with vomiting, unspecified: Secondary | ICD-10-CM | POA: Diagnosis not present

## 2013-05-29 DIAGNOSIS — Z510 Encounter for antineoplastic radiation therapy: Secondary | ICD-10-CM | POA: Diagnosis not present

## 2013-05-30 DIAGNOSIS — R112 Nausea with vomiting, unspecified: Secondary | ICD-10-CM | POA: Diagnosis not present

## 2013-05-30 DIAGNOSIS — Z510 Encounter for antineoplastic radiation therapy: Secondary | ICD-10-CM | POA: Diagnosis not present

## 2013-05-30 DIAGNOSIS — L259 Unspecified contact dermatitis, unspecified cause: Secondary | ICD-10-CM | POA: Diagnosis not present

## 2013-05-30 DIAGNOSIS — C44320 Squamous cell carcinoma of skin of unspecified parts of face: Secondary | ICD-10-CM | POA: Diagnosis not present

## 2013-05-31 DIAGNOSIS — Z510 Encounter for antineoplastic radiation therapy: Secondary | ICD-10-CM | POA: Diagnosis not present

## 2013-05-31 DIAGNOSIS — R112 Nausea with vomiting, unspecified: Secondary | ICD-10-CM | POA: Diagnosis not present

## 2013-05-31 DIAGNOSIS — L259 Unspecified contact dermatitis, unspecified cause: Secondary | ICD-10-CM | POA: Diagnosis not present

## 2013-05-31 DIAGNOSIS — C44320 Squamous cell carcinoma of skin of unspecified parts of face: Secondary | ICD-10-CM | POA: Diagnosis not present

## 2013-06-03 DIAGNOSIS — G9389 Other specified disorders of brain: Secondary | ICD-10-CM | POA: Diagnosis not present

## 2013-06-03 DIAGNOSIS — R112 Nausea with vomiting, unspecified: Secondary | ICD-10-CM | POA: Diagnosis not present

## 2013-06-03 DIAGNOSIS — I6509 Occlusion and stenosis of unspecified vertebral artery: Secondary | ICD-10-CM | POA: Diagnosis not present

## 2013-06-03 DIAGNOSIS — C44320 Squamous cell carcinoma of skin of unspecified parts of face: Secondary | ICD-10-CM | POA: Diagnosis not present

## 2013-06-03 DIAGNOSIS — L259 Unspecified contact dermatitis, unspecified cause: Secondary | ICD-10-CM | POA: Diagnosis not present

## 2013-06-03 DIAGNOSIS — I672 Cerebral atherosclerosis: Secondary | ICD-10-CM | POA: Diagnosis not present

## 2013-06-03 DIAGNOSIS — Z510 Encounter for antineoplastic radiation therapy: Secondary | ICD-10-CM | POA: Diagnosis not present

## 2013-06-04 DIAGNOSIS — L259 Unspecified contact dermatitis, unspecified cause: Secondary | ICD-10-CM | POA: Diagnosis not present

## 2013-06-04 DIAGNOSIS — Z510 Encounter for antineoplastic radiation therapy: Secondary | ICD-10-CM | POA: Diagnosis not present

## 2013-06-04 DIAGNOSIS — C44320 Squamous cell carcinoma of skin of unspecified parts of face: Secondary | ICD-10-CM | POA: Diagnosis not present

## 2013-06-04 DIAGNOSIS — R112 Nausea with vomiting, unspecified: Secondary | ICD-10-CM | POA: Diagnosis not present

## 2013-06-04 DIAGNOSIS — C44300 Unspecified malignant neoplasm of skin of unspecified part of face: Secondary | ICD-10-CM | POA: Diagnosis not present

## 2013-06-05 DIAGNOSIS — Z510 Encounter for antineoplastic radiation therapy: Secondary | ICD-10-CM | POA: Diagnosis not present

## 2013-06-05 DIAGNOSIS — C44320 Squamous cell carcinoma of skin of unspecified parts of face: Secondary | ICD-10-CM | POA: Diagnosis not present

## 2013-06-05 DIAGNOSIS — L259 Unspecified contact dermatitis, unspecified cause: Secondary | ICD-10-CM | POA: Diagnosis not present

## 2013-06-05 DIAGNOSIS — R112 Nausea with vomiting, unspecified: Secondary | ICD-10-CM | POA: Diagnosis not present

## 2013-06-06 DIAGNOSIS — L259 Unspecified contact dermatitis, unspecified cause: Secondary | ICD-10-CM | POA: Diagnosis not present

## 2013-06-06 DIAGNOSIS — Z510 Encounter for antineoplastic radiation therapy: Secondary | ICD-10-CM | POA: Diagnosis not present

## 2013-06-06 DIAGNOSIS — R112 Nausea with vomiting, unspecified: Secondary | ICD-10-CM | POA: Diagnosis not present

## 2013-06-06 DIAGNOSIS — C44320 Squamous cell carcinoma of skin of unspecified parts of face: Secondary | ICD-10-CM | POA: Diagnosis not present

## 2013-06-07 DIAGNOSIS — C44320 Squamous cell carcinoma of skin of unspecified parts of face: Secondary | ICD-10-CM | POA: Diagnosis not present

## 2013-06-07 DIAGNOSIS — R112 Nausea with vomiting, unspecified: Secondary | ICD-10-CM | POA: Diagnosis not present

## 2013-06-07 DIAGNOSIS — L259 Unspecified contact dermatitis, unspecified cause: Secondary | ICD-10-CM | POA: Diagnosis not present

## 2013-06-07 DIAGNOSIS — I6509 Occlusion and stenosis of unspecified vertebral artery: Secondary | ICD-10-CM | POA: Diagnosis not present

## 2013-06-07 DIAGNOSIS — Z510 Encounter for antineoplastic radiation therapy: Secondary | ICD-10-CM | POA: Diagnosis not present

## 2013-06-09 ENCOUNTER — Other Ambulatory Visit: Payer: Self-pay | Admitting: Family Medicine

## 2013-06-10 DIAGNOSIS — L259 Unspecified contact dermatitis, unspecified cause: Secondary | ICD-10-CM | POA: Diagnosis not present

## 2013-06-10 DIAGNOSIS — R112 Nausea with vomiting, unspecified: Secondary | ICD-10-CM | POA: Diagnosis not present

## 2013-06-10 DIAGNOSIS — C44320 Squamous cell carcinoma of skin of unspecified parts of face: Secondary | ICD-10-CM | POA: Diagnosis not present

## 2013-06-10 DIAGNOSIS — Z510 Encounter for antineoplastic radiation therapy: Secondary | ICD-10-CM | POA: Diagnosis not present

## 2013-06-11 DIAGNOSIS — K560 Paralytic ileus: Secondary | ICD-10-CM | POA: Diagnosis not present

## 2013-06-11 DIAGNOSIS — Z510 Encounter for antineoplastic radiation therapy: Secondary | ICD-10-CM | POA: Diagnosis not present

## 2013-06-11 DIAGNOSIS — C44320 Squamous cell carcinoma of skin of unspecified parts of face: Secondary | ICD-10-CM | POA: Diagnosis not present

## 2013-06-11 DIAGNOSIS — R112 Nausea with vomiting, unspecified: Secondary | ICD-10-CM | POA: Diagnosis not present

## 2013-06-11 DIAGNOSIS — L259 Unspecified contact dermatitis, unspecified cause: Secondary | ICD-10-CM | POA: Diagnosis not present

## 2013-06-11 DIAGNOSIS — R1084 Generalized abdominal pain: Secondary | ICD-10-CM | POA: Diagnosis not present

## 2013-06-11 DIAGNOSIS — K5901 Slow transit constipation: Secondary | ICD-10-CM | POA: Diagnosis not present

## 2013-06-20 DIAGNOSIS — C44211 Basal cell carcinoma of skin of unspecified ear and external auricular canal: Secondary | ICD-10-CM | POA: Diagnosis not present

## 2013-06-25 DIAGNOSIS — C44320 Squamous cell carcinoma of skin of unspecified parts of face: Secondary | ICD-10-CM | POA: Diagnosis not present

## 2013-06-25 DIAGNOSIS — M952 Other acquired deformity of head: Secondary | ICD-10-CM | POA: Diagnosis not present

## 2013-07-01 DIAGNOSIS — N189 Chronic kidney disease, unspecified: Secondary | ICD-10-CM | POA: Diagnosis not present

## 2013-07-01 DIAGNOSIS — R110 Nausea: Secondary | ICD-10-CM | POA: Diagnosis not present

## 2013-07-01 DIAGNOSIS — Z79899 Other long term (current) drug therapy: Secondary | ICD-10-CM | POA: Diagnosis not present

## 2013-07-08 DIAGNOSIS — R42 Dizziness and giddiness: Secondary | ICD-10-CM | POA: Diagnosis not present

## 2013-07-08 DIAGNOSIS — H9319 Tinnitus, unspecified ear: Secondary | ICD-10-CM | POA: Diagnosis not present

## 2013-07-08 DIAGNOSIS — H905 Unspecified sensorineural hearing loss: Secondary | ICD-10-CM | POA: Diagnosis not present

## 2013-07-08 DIAGNOSIS — H903 Sensorineural hearing loss, bilateral: Secondary | ICD-10-CM | POA: Diagnosis not present

## 2013-07-11 DIAGNOSIS — I6509 Occlusion and stenosis of unspecified vertebral artery: Secondary | ICD-10-CM | POA: Diagnosis not present

## 2013-07-11 DIAGNOSIS — Z940 Kidney transplant status: Secondary | ICD-10-CM | POA: Diagnosis not present

## 2013-07-11 DIAGNOSIS — E039 Hypothyroidism, unspecified: Secondary | ICD-10-CM | POA: Diagnosis not present

## 2013-07-11 DIAGNOSIS — I10 Essential (primary) hypertension: Secondary | ICD-10-CM | POA: Diagnosis not present

## 2013-07-18 ENCOUNTER — Encounter: Payer: Self-pay | Admitting: Cardiology

## 2013-07-23 ENCOUNTER — Encounter (HOSPITAL_COMMUNITY): Payer: Medicare Other

## 2013-07-23 DIAGNOSIS — G9340 Encephalopathy, unspecified: Secondary | ICD-10-CM | POA: Diagnosis not present

## 2013-07-23 DIAGNOSIS — E878 Other disorders of electrolyte and fluid balance, not elsewhere classified: Secondary | ICD-10-CM | POA: Diagnosis present

## 2013-07-23 DIAGNOSIS — I677 Cerebral arteritis, not elsewhere classified: Secondary | ICD-10-CM | POA: Diagnosis present

## 2013-07-23 DIAGNOSIS — F3289 Other specified depressive episodes: Secondary | ICD-10-CM | POA: Diagnosis not present

## 2013-07-23 DIAGNOSIS — I951 Orthostatic hypotension: Secondary | ICD-10-CM | POA: Diagnosis not present

## 2013-07-23 DIAGNOSIS — G0490 Encephalitis and encephalomyelitis, unspecified: Secondary | ICD-10-CM | POA: Diagnosis not present

## 2013-07-23 DIAGNOSIS — I10 Essential (primary) hypertension: Secondary | ICD-10-CM | POA: Diagnosis not present

## 2013-07-23 DIAGNOSIS — I679 Cerebrovascular disease, unspecified: Secondary | ICD-10-CM | POA: Diagnosis not present

## 2013-07-23 DIAGNOSIS — I498 Other specified cardiac arrhythmias: Secondary | ICD-10-CM | POA: Diagnosis not present

## 2013-07-23 DIAGNOSIS — R112 Nausea with vomiting, unspecified: Secondary | ICD-10-CM | POA: Diagnosis not present

## 2013-07-23 DIAGNOSIS — E039 Hypothyroidism, unspecified: Secondary | ICD-10-CM | POA: Diagnosis not present

## 2013-07-23 DIAGNOSIS — N189 Chronic kidney disease, unspecified: Secondary | ICD-10-CM | POA: Diagnosis not present

## 2013-07-23 DIAGNOSIS — C44320 Squamous cell carcinoma of skin of unspecified parts of face: Secondary | ICD-10-CM | POA: Diagnosis not present

## 2013-07-23 DIAGNOSIS — A5217 General paresis: Secondary | ICD-10-CM | POA: Diagnosis not present

## 2013-07-23 DIAGNOSIS — Z9861 Coronary angioplasty status: Secondary | ICD-10-CM | POA: Diagnosis not present

## 2013-07-23 DIAGNOSIS — R7982 Elevated C-reactive protein (CRP): Secondary | ICD-10-CM | POA: Diagnosis present

## 2013-07-23 DIAGNOSIS — I517 Cardiomegaly: Secondary | ICD-10-CM | POA: Diagnosis not present

## 2013-07-23 DIAGNOSIS — A89 Unspecified viral infection of central nervous system: Secondary | ICD-10-CM | POA: Diagnosis present

## 2013-07-23 DIAGNOSIS — Z940 Kidney transplant status: Secondary | ICD-10-CM | POA: Diagnosis not present

## 2013-07-23 DIAGNOSIS — I2510 Atherosclerotic heart disease of native coronary artery without angina pectoris: Secondary | ICD-10-CM | POA: Diagnosis present

## 2013-07-23 DIAGNOSIS — R569 Unspecified convulsions: Secondary | ICD-10-CM | POA: Diagnosis not present

## 2013-07-23 DIAGNOSIS — G031 Chronic meningitis: Secondary | ICD-10-CM | POA: Diagnosis not present

## 2013-07-23 DIAGNOSIS — I059 Rheumatic mitral valve disease, unspecified: Secondary | ICD-10-CM | POA: Diagnosis not present

## 2013-07-23 DIAGNOSIS — F329 Major depressive disorder, single episode, unspecified: Secondary | ICD-10-CM | POA: Diagnosis present

## 2013-07-23 DIAGNOSIS — Q612 Polycystic kidney, adult type: Secondary | ICD-10-CM | POA: Diagnosis not present

## 2013-07-23 DIAGNOSIS — I6789 Other cerebrovascular disease: Secondary | ICD-10-CM | POA: Diagnosis not present

## 2013-07-23 DIAGNOSIS — E46 Unspecified protein-calorie malnutrition: Secondary | ICD-10-CM | POA: Diagnosis not present

## 2013-07-23 DIAGNOSIS — Z87891 Personal history of nicotine dependence: Secondary | ICD-10-CM | POA: Diagnosis not present

## 2013-07-23 DIAGNOSIS — E872 Acidosis: Secondary | ICD-10-CM | POA: Diagnosis present

## 2013-07-23 DIAGNOSIS — N185 Chronic kidney disease, stage 5: Secondary | ICD-10-CM | POA: Diagnosis not present

## 2013-07-23 DIAGNOSIS — Z923 Personal history of irradiation: Secondary | ICD-10-CM | POA: Diagnosis not present

## 2013-07-23 DIAGNOSIS — R918 Other nonspecific abnormal finding of lung field: Secondary | ICD-10-CM | POA: Diagnosis not present

## 2013-07-23 DIAGNOSIS — N182 Chronic kidney disease, stage 2 (mild): Secondary | ICD-10-CM | POA: Diagnosis not present

## 2013-07-23 DIAGNOSIS — C44300 Unspecified malignant neoplasm of skin of unspecified part of face: Secondary | ICD-10-CM | POA: Diagnosis not present

## 2013-07-23 DIAGNOSIS — I6350 Cerebral infarction due to unspecified occlusion or stenosis of unspecified cerebral artery: Secondary | ICD-10-CM | POA: Diagnosis not present

## 2013-07-23 DIAGNOSIS — Z5189 Encounter for other specified aftercare: Secondary | ICD-10-CM | POA: Diagnosis not present

## 2013-07-23 DIAGNOSIS — Z4682 Encounter for fitting and adjustment of non-vascular catheter: Secondary | ICD-10-CM | POA: Diagnosis not present

## 2013-07-23 DIAGNOSIS — N19 Unspecified kidney failure: Secondary | ICD-10-CM | POA: Diagnosis present

## 2013-07-23 DIAGNOSIS — R4182 Altered mental status, unspecified: Secondary | ICD-10-CM | POA: Diagnosis not present

## 2013-07-23 DIAGNOSIS — E871 Hypo-osmolality and hyponatremia: Secondary | ICD-10-CM | POA: Diagnosis not present

## 2013-07-23 DIAGNOSIS — E785 Hyperlipidemia, unspecified: Secondary | ICD-10-CM | POA: Diagnosis not present

## 2013-07-23 DIAGNOSIS — G0481 Other encephalitis and encephalomyelitis: Secondary | ICD-10-CM | POA: Diagnosis not present

## 2013-07-23 DIAGNOSIS — E43 Unspecified severe protein-calorie malnutrition: Secondary | ICD-10-CM | POA: Diagnosis not present

## 2013-07-23 DIAGNOSIS — R0609 Other forms of dyspnea: Secondary | ICD-10-CM | POA: Diagnosis not present

## 2013-07-23 DIAGNOSIS — Z85828 Personal history of other malignant neoplasm of skin: Secondary | ICD-10-CM | POA: Diagnosis not present

## 2013-07-23 DIAGNOSIS — G039 Meningitis, unspecified: Secondary | ICD-10-CM | POA: Diagnosis not present

## 2013-07-23 DIAGNOSIS — R9431 Abnormal electrocardiogram [ECG] [EKG]: Secondary | ICD-10-CM | POA: Diagnosis not present

## 2013-07-23 DIAGNOSIS — R839 Unspecified abnormal finding in cerebrospinal fluid: Secondary | ICD-10-CM | POA: Diagnosis not present

## 2013-07-23 DIAGNOSIS — R269 Unspecified abnormalities of gait and mobility: Secondary | ICD-10-CM | POA: Diagnosis not present

## 2013-07-23 DIAGNOSIS — B2790 Infectious mononucleosis, unspecified without complication: Secondary | ICD-10-CM | POA: Diagnosis not present

## 2013-07-23 DIAGNOSIS — S0990XA Unspecified injury of head, initial encounter: Secondary | ICD-10-CM | POA: Diagnosis not present

## 2013-07-23 DIAGNOSIS — I658 Occlusion and stenosis of other precerebral arteries: Secondary | ICD-10-CM | POA: Diagnosis not present

## 2013-07-23 DIAGNOSIS — Z9483 Pancreas transplant status: Secondary | ICD-10-CM | POA: Diagnosis not present

## 2013-07-23 DIAGNOSIS — Q613 Polycystic kidney, unspecified: Secondary | ICD-10-CM | POA: Diagnosis not present

## 2013-07-23 DIAGNOSIS — I129 Hypertensive chronic kidney disease with stage 1 through stage 4 chronic kidney disease, or unspecified chronic kidney disease: Secondary | ICD-10-CM | POA: Diagnosis not present

## 2013-07-23 DIAGNOSIS — I120 Hypertensive chronic kidney disease with stage 5 chronic kidney disease or end stage renal disease: Secondary | ICD-10-CM | POA: Diagnosis not present

## 2013-07-23 DIAGNOSIS — R197 Diarrhea, unspecified: Secondary | ICD-10-CM | POA: Diagnosis not present

## 2013-07-24 ENCOUNTER — Telehealth: Payer: Self-pay | Admitting: *Deleted

## 2013-07-24 NOTE — Telephone Encounter (Signed)
Pt was here for Nuclear test yesterday and this was cancelled as he was not feeling well when he arrived in office.   I placed call to pt  to see how he was feeling. Left message on home and cell numbers to call back.

## 2013-08-05 NOTE — Telephone Encounter (Signed)
Left message on home and cell numbers to call back 

## 2013-08-13 DIAGNOSIS — Z79899 Other long term (current) drug therapy: Secondary | ICD-10-CM | POA: Diagnosis not present

## 2013-08-13 DIAGNOSIS — I129 Hypertensive chronic kidney disease with stage 1 through stage 4 chronic kidney disease, or unspecified chronic kidney disease: Secondary | ICD-10-CM | POA: Diagnosis not present

## 2013-08-13 DIAGNOSIS — Z5189 Encounter for other specified aftercare: Secondary | ICD-10-CM | POA: Diagnosis not present

## 2013-08-13 DIAGNOSIS — E039 Hypothyroidism, unspecified: Secondary | ICD-10-CM | POA: Diagnosis not present

## 2013-08-13 DIAGNOSIS — I2510 Atherosclerotic heart disease of native coronary artery without angina pectoris: Secondary | ICD-10-CM | POA: Diagnosis not present

## 2013-08-13 DIAGNOSIS — N184 Chronic kidney disease, stage 4 (severe): Secondary | ICD-10-CM | POA: Diagnosis not present

## 2013-08-13 DIAGNOSIS — Q612 Polycystic kidney, adult type: Secondary | ICD-10-CM | POA: Diagnosis not present

## 2013-08-13 DIAGNOSIS — E785 Hyperlipidemia, unspecified: Secondary | ICD-10-CM | POA: Diagnosis not present

## 2013-08-13 DIAGNOSIS — Z9483 Pancreas transplant status: Secondary | ICD-10-CM | POA: Diagnosis not present

## 2013-08-13 DIAGNOSIS — G0490 Encephalitis and encephalomyelitis, unspecified: Secondary | ICD-10-CM | POA: Diagnosis not present

## 2013-08-13 DIAGNOSIS — T8610 Unspecified complication of kidney transplant: Secondary | ICD-10-CM | POA: Diagnosis not present

## 2013-08-13 DIAGNOSIS — F329 Major depressive disorder, single episode, unspecified: Secondary | ICD-10-CM | POA: Diagnosis not present

## 2013-08-13 DIAGNOSIS — B2790 Infectious mononucleosis, unspecified without complication: Secondary | ICD-10-CM | POA: Diagnosis not present

## 2013-08-13 DIAGNOSIS — Z940 Kidney transplant status: Secondary | ICD-10-CM | POA: Diagnosis not present

## 2013-08-13 DIAGNOSIS — N182 Chronic kidney disease, stage 2 (mild): Secondary | ICD-10-CM | POA: Diagnosis not present

## 2013-08-13 DIAGNOSIS — I10 Essential (primary) hypertension: Secondary | ICD-10-CM | POA: Diagnosis not present

## 2013-08-15 DIAGNOSIS — G0490 Encephalitis and encephalomyelitis, unspecified: Secondary | ICD-10-CM | POA: Diagnosis not present

## 2013-08-15 DIAGNOSIS — N182 Chronic kidney disease, stage 2 (mild): Secondary | ICD-10-CM | POA: Diagnosis not present

## 2013-08-15 DIAGNOSIS — Q612 Polycystic kidney, adult type: Secondary | ICD-10-CM | POA: Diagnosis not present

## 2013-08-15 DIAGNOSIS — Z5189 Encounter for other specified aftercare: Secondary | ICD-10-CM | POA: Diagnosis not present

## 2013-08-15 DIAGNOSIS — E785 Hyperlipidemia, unspecified: Secondary | ICD-10-CM | POA: Diagnosis not present

## 2013-08-15 DIAGNOSIS — N184 Chronic kidney disease, stage 4 (severe): Secondary | ICD-10-CM | POA: Diagnosis not present

## 2013-08-15 DIAGNOSIS — Z79899 Other long term (current) drug therapy: Secondary | ICD-10-CM | POA: Diagnosis not present

## 2013-08-15 DIAGNOSIS — I2510 Atherosclerotic heart disease of native coronary artery without angina pectoris: Secondary | ICD-10-CM | POA: Diagnosis not present

## 2013-08-15 DIAGNOSIS — T8610 Unspecified complication of kidney transplant: Secondary | ICD-10-CM | POA: Diagnosis not present

## 2013-08-15 DIAGNOSIS — Z940 Kidney transplant status: Secondary | ICD-10-CM | POA: Diagnosis not present

## 2013-08-15 DIAGNOSIS — I129 Hypertensive chronic kidney disease with stage 1 through stage 4 chronic kidney disease, or unspecified chronic kidney disease: Secondary | ICD-10-CM | POA: Diagnosis not present

## 2013-08-19 ENCOUNTER — Ambulatory Visit: Payer: Medicare Other | Admitting: Family Medicine

## 2013-08-23 DIAGNOSIS — N182 Chronic kidney disease, stage 2 (mild): Secondary | ICD-10-CM | POA: Diagnosis not present

## 2013-08-23 DIAGNOSIS — G0490 Encephalitis and encephalomyelitis, unspecified: Secondary | ICD-10-CM | POA: Diagnosis not present

## 2013-08-23 DIAGNOSIS — F329 Major depressive disorder, single episode, unspecified: Secondary | ICD-10-CM | POA: Diagnosis not present

## 2013-08-23 DIAGNOSIS — I2510 Atherosclerotic heart disease of native coronary artery without angina pectoris: Secondary | ICD-10-CM | POA: Diagnosis not present

## 2013-08-23 DIAGNOSIS — I10 Essential (primary) hypertension: Secondary | ICD-10-CM | POA: Diagnosis not present

## 2013-08-23 DIAGNOSIS — R269 Unspecified abnormalities of gait and mobility: Secondary | ICD-10-CM | POA: Diagnosis not present

## 2013-08-26 DIAGNOSIS — F329 Major depressive disorder, single episode, unspecified: Secondary | ICD-10-CM | POA: Diagnosis not present

## 2013-08-26 DIAGNOSIS — N182 Chronic kidney disease, stage 2 (mild): Secondary | ICD-10-CM | POA: Diagnosis not present

## 2013-08-26 DIAGNOSIS — G0490 Encephalitis and encephalomyelitis, unspecified: Secondary | ICD-10-CM | POA: Diagnosis not present

## 2013-08-26 DIAGNOSIS — I2510 Atherosclerotic heart disease of native coronary artery without angina pectoris: Secondary | ICD-10-CM | POA: Diagnosis not present

## 2013-08-26 DIAGNOSIS — I10 Essential (primary) hypertension: Secondary | ICD-10-CM | POA: Diagnosis not present

## 2013-08-26 DIAGNOSIS — R269 Unspecified abnormalities of gait and mobility: Secondary | ICD-10-CM | POA: Diagnosis not present

## 2013-08-28 DIAGNOSIS — I2510 Atherosclerotic heart disease of native coronary artery without angina pectoris: Secondary | ICD-10-CM | POA: Diagnosis not present

## 2013-08-28 DIAGNOSIS — A3981 Meningococcal encephalitis: Secondary | ICD-10-CM | POA: Diagnosis not present

## 2013-08-28 DIAGNOSIS — N185 Chronic kidney disease, stage 5: Secondary | ICD-10-CM | POA: Diagnosis not present

## 2013-08-28 DIAGNOSIS — I10 Essential (primary) hypertension: Secondary | ICD-10-CM | POA: Diagnosis not present

## 2013-08-28 DIAGNOSIS — E871 Hypo-osmolality and hyponatremia: Secondary | ICD-10-CM | POA: Diagnosis not present

## 2013-08-28 DIAGNOSIS — D899 Disorder involving the immune mechanism, unspecified: Secondary | ICD-10-CM | POA: Diagnosis not present

## 2013-08-28 DIAGNOSIS — E039 Hypothyroidism, unspecified: Secondary | ICD-10-CM | POA: Diagnosis not present

## 2013-08-28 DIAGNOSIS — Z7982 Long term (current) use of aspirin: Secondary | ICD-10-CM | POA: Diagnosis not present

## 2013-08-28 DIAGNOSIS — R4182 Altered mental status, unspecified: Secondary | ICD-10-CM | POA: Diagnosis not present

## 2013-08-28 DIAGNOSIS — E785 Hyperlipidemia, unspecified: Secondary | ICD-10-CM | POA: Diagnosis not present

## 2013-08-28 DIAGNOSIS — D631 Anemia in chronic kidney disease: Secondary | ICD-10-CM | POA: Diagnosis not present

## 2013-08-28 DIAGNOSIS — G0490 Encephalitis and encephalomyelitis, unspecified: Secondary | ICD-10-CM | POA: Diagnosis not present

## 2013-08-28 DIAGNOSIS — Q612 Polycystic kidney, adult type: Secondary | ICD-10-CM | POA: Diagnosis not present

## 2013-08-28 DIAGNOSIS — B2790 Infectious mononucleosis, unspecified without complication: Secondary | ICD-10-CM | POA: Diagnosis not present

## 2013-08-28 DIAGNOSIS — B029 Zoster without complications: Secondary | ICD-10-CM | POA: Diagnosis not present

## 2013-08-28 DIAGNOSIS — K219 Gastro-esophageal reflux disease without esophagitis: Secondary | ICD-10-CM | POA: Diagnosis not present

## 2013-08-28 DIAGNOSIS — Z940 Kidney transplant status: Secondary | ICD-10-CM | POA: Diagnosis not present

## 2013-08-29 DIAGNOSIS — I2510 Atherosclerotic heart disease of native coronary artery without angina pectoris: Secondary | ICD-10-CM | POA: Diagnosis not present

## 2013-08-29 DIAGNOSIS — N182 Chronic kidney disease, stage 2 (mild): Secondary | ICD-10-CM | POA: Diagnosis not present

## 2013-08-29 DIAGNOSIS — R269 Unspecified abnormalities of gait and mobility: Secondary | ICD-10-CM | POA: Diagnosis not present

## 2013-08-29 DIAGNOSIS — G0490 Encephalitis and encephalomyelitis, unspecified: Secondary | ICD-10-CM | POA: Diagnosis not present

## 2013-08-29 DIAGNOSIS — I10 Essential (primary) hypertension: Secondary | ICD-10-CM | POA: Diagnosis not present

## 2013-08-29 DIAGNOSIS — F329 Major depressive disorder, single episode, unspecified: Secondary | ICD-10-CM | POA: Diagnosis not present

## 2013-09-03 DIAGNOSIS — Z940 Kidney transplant status: Secondary | ICD-10-CM | POA: Diagnosis not present

## 2013-09-03 DIAGNOSIS — G0490 Encephalitis and encephalomyelitis, unspecified: Secondary | ICD-10-CM | POA: Diagnosis not present

## 2013-09-03 DIAGNOSIS — I10 Essential (primary) hypertension: Secondary | ICD-10-CM | POA: Diagnosis not present

## 2013-09-03 DIAGNOSIS — R269 Unspecified abnormalities of gait and mobility: Secondary | ICD-10-CM | POA: Diagnosis not present

## 2013-09-03 DIAGNOSIS — F329 Major depressive disorder, single episode, unspecified: Secondary | ICD-10-CM | POA: Diagnosis not present

## 2013-09-03 DIAGNOSIS — N182 Chronic kidney disease, stage 2 (mild): Secondary | ICD-10-CM | POA: Diagnosis not present

## 2013-09-03 DIAGNOSIS — I2510 Atherosclerotic heart disease of native coronary artery without angina pectoris: Secondary | ICD-10-CM | POA: Diagnosis not present

## 2013-09-03 DIAGNOSIS — B2790 Infectious mononucleosis, unspecified without complication: Secondary | ICD-10-CM | POA: Diagnosis not present

## 2013-09-03 DIAGNOSIS — I6340 Cerebral infarction due to embolism of unspecified cerebral artery: Secondary | ICD-10-CM | POA: Diagnosis not present

## 2013-09-03 DIAGNOSIS — G0491 Myelitis, unspecified: Secondary | ICD-10-CM | POA: Diagnosis not present

## 2013-09-04 ENCOUNTER — Encounter: Payer: Self-pay | Admitting: Cardiovascular Disease

## 2013-09-04 DIAGNOSIS — I4891 Unspecified atrial fibrillation: Secondary | ICD-10-CM | POA: Diagnosis not present

## 2013-09-06 DIAGNOSIS — G0490 Encephalitis and encephalomyelitis, unspecified: Secondary | ICD-10-CM | POA: Diagnosis not present

## 2013-09-06 DIAGNOSIS — F329 Major depressive disorder, single episode, unspecified: Secondary | ICD-10-CM | POA: Diagnosis not present

## 2013-09-06 DIAGNOSIS — R269 Unspecified abnormalities of gait and mobility: Secondary | ICD-10-CM | POA: Diagnosis not present

## 2013-09-06 DIAGNOSIS — N182 Chronic kidney disease, stage 2 (mild): Secondary | ICD-10-CM | POA: Diagnosis not present

## 2013-09-06 DIAGNOSIS — I2510 Atherosclerotic heart disease of native coronary artery without angina pectoris: Secondary | ICD-10-CM | POA: Diagnosis not present

## 2013-09-06 DIAGNOSIS — I10 Essential (primary) hypertension: Secondary | ICD-10-CM | POA: Diagnosis not present

## 2013-09-09 DIAGNOSIS — I2510 Atherosclerotic heart disease of native coronary artery without angina pectoris: Secondary | ICD-10-CM | POA: Diagnosis not present

## 2013-09-09 DIAGNOSIS — I10 Essential (primary) hypertension: Secondary | ICD-10-CM | POA: Diagnosis not present

## 2013-09-09 DIAGNOSIS — R269 Unspecified abnormalities of gait and mobility: Secondary | ICD-10-CM | POA: Diagnosis not present

## 2013-09-09 DIAGNOSIS — N182 Chronic kidney disease, stage 2 (mild): Secondary | ICD-10-CM | POA: Diagnosis not present

## 2013-09-09 DIAGNOSIS — F329 Major depressive disorder, single episode, unspecified: Secondary | ICD-10-CM | POA: Diagnosis not present

## 2013-09-09 DIAGNOSIS — G0490 Encephalitis and encephalomyelitis, unspecified: Secondary | ICD-10-CM | POA: Diagnosis not present

## 2013-09-10 DIAGNOSIS — N182 Chronic kidney disease, stage 2 (mild): Secondary | ICD-10-CM | POA: Diagnosis not present

## 2013-09-10 DIAGNOSIS — R269 Unspecified abnormalities of gait and mobility: Secondary | ICD-10-CM | POA: Diagnosis not present

## 2013-09-10 DIAGNOSIS — I10 Essential (primary) hypertension: Secondary | ICD-10-CM | POA: Diagnosis not present

## 2013-09-10 DIAGNOSIS — G0490 Encephalitis and encephalomyelitis, unspecified: Secondary | ICD-10-CM | POA: Diagnosis not present

## 2013-09-10 DIAGNOSIS — F329 Major depressive disorder, single episode, unspecified: Secondary | ICD-10-CM | POA: Diagnosis not present

## 2013-09-10 DIAGNOSIS — I2510 Atherosclerotic heart disease of native coronary artery without angina pectoris: Secondary | ICD-10-CM | POA: Diagnosis not present

## 2013-09-12 HISTORY — PX: FACIAL RECONSTRUCTION SURGERY: SHX631

## 2013-09-13 DIAGNOSIS — G0491 Myelitis, unspecified: Secondary | ICD-10-CM | POA: Diagnosis not present

## 2013-09-13 DIAGNOSIS — N182 Chronic kidney disease, stage 2 (mild): Secondary | ICD-10-CM | POA: Diagnosis not present

## 2013-09-13 DIAGNOSIS — I2510 Atherosclerotic heart disease of native coronary artery without angina pectoris: Secondary | ICD-10-CM | POA: Diagnosis not present

## 2013-09-13 DIAGNOSIS — F3289 Other specified depressive episodes: Secondary | ICD-10-CM | POA: Diagnosis not present

## 2013-09-13 DIAGNOSIS — F329 Major depressive disorder, single episode, unspecified: Secondary | ICD-10-CM | POA: Diagnosis not present

## 2013-09-13 DIAGNOSIS — G0490 Encephalitis and encephalomyelitis, unspecified: Secondary | ICD-10-CM | POA: Diagnosis not present

## 2013-09-13 DIAGNOSIS — I10 Essential (primary) hypertension: Secondary | ICD-10-CM | POA: Diagnosis not present

## 2013-09-13 DIAGNOSIS — R269 Unspecified abnormalities of gait and mobility: Secondary | ICD-10-CM | POA: Diagnosis not present

## 2013-09-16 DIAGNOSIS — L570 Actinic keratosis: Secondary | ICD-10-CM | POA: Diagnosis not present

## 2013-09-17 ENCOUNTER — Other Ambulatory Visit: Payer: Self-pay | Admitting: Family Medicine

## 2013-09-17 ENCOUNTER — Encounter: Payer: Self-pay | Admitting: Family Medicine

## 2013-09-17 NOTE — Telephone Encounter (Signed)
She appears due for her medicare wellness..., scheduled and okay to refill until then.

## 2013-09-17 NOTE — Telephone Encounter (Signed)
Patient advised.   Refilled for one 90 day supply and must schedule OV prior to any additional RF.

## 2013-09-17 NOTE — Telephone Encounter (Signed)
Electronic refill request. Refill protocol calls for your approval for refills.

## 2013-09-20 DIAGNOSIS — R269 Unspecified abnormalities of gait and mobility: Secondary | ICD-10-CM | POA: Diagnosis not present

## 2013-09-20 DIAGNOSIS — I10 Essential (primary) hypertension: Secondary | ICD-10-CM | POA: Diagnosis not present

## 2013-09-20 DIAGNOSIS — F3289 Other specified depressive episodes: Secondary | ICD-10-CM | POA: Diagnosis not present

## 2013-09-20 DIAGNOSIS — G0491 Myelitis, unspecified: Secondary | ICD-10-CM | POA: Diagnosis not present

## 2013-09-20 DIAGNOSIS — F329 Major depressive disorder, single episode, unspecified: Secondary | ICD-10-CM | POA: Diagnosis not present

## 2013-09-20 DIAGNOSIS — I2510 Atherosclerotic heart disease of native coronary artery without angina pectoris: Secondary | ICD-10-CM | POA: Diagnosis not present

## 2013-09-20 DIAGNOSIS — N182 Chronic kidney disease, stage 2 (mild): Secondary | ICD-10-CM | POA: Diagnosis not present

## 2013-09-20 DIAGNOSIS — G0490 Encephalitis and encephalomyelitis, unspecified: Secondary | ICD-10-CM | POA: Diagnosis not present

## 2013-09-26 DIAGNOSIS — F329 Major depressive disorder, single episode, unspecified: Secondary | ICD-10-CM | POA: Diagnosis not present

## 2013-09-26 DIAGNOSIS — R269 Unspecified abnormalities of gait and mobility: Secondary | ICD-10-CM | POA: Diagnosis not present

## 2013-09-26 DIAGNOSIS — N182 Chronic kidney disease, stage 2 (mild): Secondary | ICD-10-CM | POA: Diagnosis not present

## 2013-09-26 DIAGNOSIS — G0491 Myelitis, unspecified: Secondary | ICD-10-CM | POA: Diagnosis not present

## 2013-09-26 DIAGNOSIS — F3289 Other specified depressive episodes: Secondary | ICD-10-CM | POA: Diagnosis not present

## 2013-09-26 DIAGNOSIS — G0490 Encephalitis and encephalomyelitis, unspecified: Secondary | ICD-10-CM | POA: Diagnosis not present

## 2013-10-02 DIAGNOSIS — I2510 Atherosclerotic heart disease of native coronary artery without angina pectoris: Secondary | ICD-10-CM | POA: Diagnosis not present

## 2013-10-02 DIAGNOSIS — F3289 Other specified depressive episodes: Secondary | ICD-10-CM | POA: Diagnosis not present

## 2013-10-02 DIAGNOSIS — R269 Unspecified abnormalities of gait and mobility: Secondary | ICD-10-CM | POA: Diagnosis not present

## 2013-10-02 DIAGNOSIS — F329 Major depressive disorder, single episode, unspecified: Secondary | ICD-10-CM | POA: Diagnosis not present

## 2013-10-02 DIAGNOSIS — I10 Essential (primary) hypertension: Secondary | ICD-10-CM | POA: Diagnosis not present

## 2013-10-02 DIAGNOSIS — G0491 Myelitis, unspecified: Secondary | ICD-10-CM | POA: Diagnosis not present

## 2013-10-02 DIAGNOSIS — N182 Chronic kidney disease, stage 2 (mild): Secondary | ICD-10-CM | POA: Diagnosis not present

## 2013-10-02 DIAGNOSIS — G0490 Encephalitis and encephalomyelitis, unspecified: Secondary | ICD-10-CM | POA: Diagnosis not present

## 2013-10-03 DIAGNOSIS — C44320 Squamous cell carcinoma of skin of unspecified parts of face: Secondary | ICD-10-CM | POA: Diagnosis not present

## 2013-10-03 DIAGNOSIS — Z8589 Personal history of malignant neoplasm of other organs and systems: Secondary | ICD-10-CM | POA: Diagnosis not present

## 2013-10-03 DIAGNOSIS — Z8673 Personal history of transient ischemic attack (TIA), and cerebral infarction without residual deficits: Secondary | ICD-10-CM | POA: Diagnosis not present

## 2013-10-03 DIAGNOSIS — Z09 Encounter for follow-up examination after completed treatment for conditions other than malignant neoplasm: Secondary | ICD-10-CM | POA: Diagnosis not present

## 2013-10-07 DIAGNOSIS — G0490 Encephalitis and encephalomyelitis, unspecified: Secondary | ICD-10-CM | POA: Diagnosis not present

## 2013-10-07 DIAGNOSIS — I2510 Atherosclerotic heart disease of native coronary artery without angina pectoris: Secondary | ICD-10-CM | POA: Diagnosis not present

## 2013-10-07 DIAGNOSIS — F329 Major depressive disorder, single episode, unspecified: Secondary | ICD-10-CM | POA: Diagnosis not present

## 2013-10-07 DIAGNOSIS — N182 Chronic kidney disease, stage 2 (mild): Secondary | ICD-10-CM | POA: Diagnosis not present

## 2013-10-07 DIAGNOSIS — F3289 Other specified depressive episodes: Secondary | ICD-10-CM | POA: Diagnosis not present

## 2013-10-07 DIAGNOSIS — I10 Essential (primary) hypertension: Secondary | ICD-10-CM | POA: Diagnosis not present

## 2013-10-07 DIAGNOSIS — R269 Unspecified abnormalities of gait and mobility: Secondary | ICD-10-CM | POA: Diagnosis not present

## 2013-10-08 ENCOUNTER — Telehealth: Payer: Self-pay | Admitting: Cardiovascular Disease

## 2013-10-08 NOTE — Telephone Encounter (Signed)
Would not stop ASA and Brilinta prior to march of 2015. His MI and stents were in march 2014. Edward Oconnell

## 2013-10-08 NOTE — Telephone Encounter (Signed)
Spoke with Luvenia Starch, NP and gave her information from Dr [name]. She reports pt had embolic stroke within last 6 weeks.  Was discharged and wore monitor at home. Monitor showed Atrial fib in last 7 days.  Pt with episodes of SR and Atrial fib on monitor.  Luvenia Starch, NP would like to talk with Dr. [name] about starting Warfarin or one of the newer anti coagulation agents while pt on Brilinta and ASA. They are seeing pt in office on 1/29.  Call back number [phone] is pager number and best number to reach Luvenia Starch. Strips have been faxed to our office.

## 2013-10-08 NOTE — Telephone Encounter (Signed)
New Problem:  Edward Oconnell is calling from [facility] in reference to the patient's Anticouagulation for A-fib... Edward Roch, NP, would like recommendations for continuing verlinta and aspirin.  She is requesting a call back. Pt is to be seen back at her office on 1/29.

## 2013-10-08 NOTE — Telephone Encounter (Signed)
Fax of strips left on triage room message cart.

## 2013-10-09 NOTE — Telephone Encounter (Signed)
I spoke to Luvenia Starch, NP. I have given recommendations regarding combination of coumadin with Brilinta while stopping ASA or changing to Plavix and novel agent if renal function is stable. cdm

## 2013-10-10 DIAGNOSIS — E785 Hyperlipidemia, unspecified: Secondary | ICD-10-CM | POA: Diagnosis not present

## 2013-10-10 DIAGNOSIS — Z79899 Other long term (current) drug therapy: Secondary | ICD-10-CM | POA: Diagnosis not present

## 2013-10-10 DIAGNOSIS — Z940 Kidney transplant status: Secondary | ICD-10-CM | POA: Diagnosis not present

## 2013-10-10 DIAGNOSIS — F3289 Other specified depressive episodes: Secondary | ICD-10-CM | POA: Diagnosis not present

## 2013-10-10 DIAGNOSIS — T8610 Unspecified complication of kidney transplant: Secondary | ICD-10-CM | POA: Diagnosis not present

## 2013-10-10 DIAGNOSIS — D649 Anemia, unspecified: Secondary | ICD-10-CM | POA: Diagnosis not present

## 2013-10-10 DIAGNOSIS — R1110 Vomiting, unspecified: Secondary | ICD-10-CM | POA: Diagnosis not present

## 2013-10-10 DIAGNOSIS — I6350 Cerebral infarction due to unspecified occlusion or stenosis of unspecified cerebral artery: Secondary | ICD-10-CM | POA: Diagnosis not present

## 2013-10-10 DIAGNOSIS — N183 Chronic kidney disease, stage 3 unspecified: Secondary | ICD-10-CM | POA: Diagnosis not present

## 2013-10-10 DIAGNOSIS — D899 Disorder involving the immune mechanism, unspecified: Secondary | ICD-10-CM | POA: Diagnosis not present

## 2013-10-10 DIAGNOSIS — I129 Hypertensive chronic kidney disease with stage 1 through stage 4 chronic kidney disease, or unspecified chronic kidney disease: Secondary | ICD-10-CM | POA: Diagnosis not present

## 2013-10-10 DIAGNOSIS — B2790 Infectious mononucleosis, unspecified without complication: Secondary | ICD-10-CM | POA: Diagnosis not present

## 2013-10-10 DIAGNOSIS — F329 Major depressive disorder, single episode, unspecified: Secondary | ICD-10-CM | POA: Diagnosis not present

## 2013-10-10 DIAGNOSIS — I4891 Unspecified atrial fibrillation: Secondary | ICD-10-CM | POA: Diagnosis not present

## 2013-10-14 DIAGNOSIS — K5730 Diverticulosis of large intestine without perforation or abscess without bleeding: Secondary | ICD-10-CM | POA: Diagnosis not present

## 2013-10-14 DIAGNOSIS — R112 Nausea with vomiting, unspecified: Secondary | ICD-10-CM | POA: Diagnosis not present

## 2013-10-14 DIAGNOSIS — I6789 Other cerebrovascular disease: Secondary | ICD-10-CM | POA: Diagnosis not present

## 2013-10-14 DIAGNOSIS — I6509 Occlusion and stenosis of unspecified vertebral artery: Secondary | ICD-10-CM | POA: Diagnosis not present

## 2013-10-14 DIAGNOSIS — Z8673 Personal history of transient ischemic attack (TIA), and cerebral infarction without residual deficits: Secondary | ICD-10-CM | POA: Diagnosis not present

## 2013-10-14 DIAGNOSIS — R918 Other nonspecific abnormal finding of lung field: Secondary | ICD-10-CM | POA: Diagnosis not present

## 2013-10-14 DIAGNOSIS — Z85828 Personal history of other malignant neoplasm of skin: Secondary | ICD-10-CM | POA: Diagnosis not present

## 2013-10-14 DIAGNOSIS — Z940 Kidney transplant status: Secondary | ICD-10-CM | POA: Diagnosis not present

## 2013-10-14 DIAGNOSIS — R1110 Vomiting, unspecified: Secondary | ICD-10-CM | POA: Diagnosis not present

## 2013-10-14 DIAGNOSIS — Z859 Personal history of malignant neoplasm, unspecified: Secondary | ICD-10-CM | POA: Diagnosis not present

## 2013-10-15 DIAGNOSIS — I2510 Atherosclerotic heart disease of native coronary artery without angina pectoris: Secondary | ICD-10-CM | POA: Diagnosis not present

## 2013-10-15 DIAGNOSIS — N182 Chronic kidney disease, stage 2 (mild): Secondary | ICD-10-CM | POA: Diagnosis not present

## 2013-10-15 DIAGNOSIS — I10 Essential (primary) hypertension: Secondary | ICD-10-CM | POA: Diagnosis not present

## 2013-10-15 DIAGNOSIS — R269 Unspecified abnormalities of gait and mobility: Secondary | ICD-10-CM | POA: Diagnosis not present

## 2013-10-15 DIAGNOSIS — N189 Chronic kidney disease, unspecified: Secondary | ICD-10-CM | POA: Diagnosis not present

## 2013-10-15 DIAGNOSIS — F3289 Other specified depressive episodes: Secondary | ICD-10-CM | POA: Diagnosis not present

## 2013-10-15 DIAGNOSIS — G0490 Encephalitis and encephalomyelitis, unspecified: Secondary | ICD-10-CM | POA: Diagnosis not present

## 2013-10-15 DIAGNOSIS — F329 Major depressive disorder, single episode, unspecified: Secondary | ICD-10-CM | POA: Diagnosis not present

## 2013-10-20 ENCOUNTER — Encounter: Payer: Self-pay | Admitting: Family Medicine

## 2013-10-20 DIAGNOSIS — F32A Depression, unspecified: Secondary | ICD-10-CM | POA: Insufficient documentation

## 2013-10-20 DIAGNOSIS — F329 Major depressive disorder, single episode, unspecified: Secondary | ICD-10-CM | POA: Insufficient documentation

## 2013-10-21 ENCOUNTER — Other Ambulatory Visit (HOSPITAL_COMMUNITY): Payer: Self-pay | Admitting: Physician Assistant

## 2013-10-21 DIAGNOSIS — Z85828 Personal history of other malignant neoplasm of skin: Secondary | ICD-10-CM | POA: Diagnosis not present

## 2013-10-21 DIAGNOSIS — F329 Major depressive disorder, single episode, unspecified: Secondary | ICD-10-CM | POA: Diagnosis not present

## 2013-10-21 DIAGNOSIS — R269 Unspecified abnormalities of gait and mobility: Secondary | ICD-10-CM | POA: Diagnosis not present

## 2013-10-21 DIAGNOSIS — I10 Essential (primary) hypertension: Secondary | ICD-10-CM | POA: Diagnosis not present

## 2013-10-21 DIAGNOSIS — G0490 Encephalitis and encephalomyelitis, unspecified: Secondary | ICD-10-CM | POA: Diagnosis not present

## 2013-10-21 DIAGNOSIS — R918 Other nonspecific abnormal finding of lung field: Secondary | ICD-10-CM | POA: Diagnosis not present

## 2013-10-21 DIAGNOSIS — F3289 Other specified depressive episodes: Secondary | ICD-10-CM | POA: Diagnosis not present

## 2013-10-21 DIAGNOSIS — N182 Chronic kidney disease, stage 2 (mild): Secondary | ICD-10-CM | POA: Diagnosis not present

## 2013-10-21 DIAGNOSIS — I2510 Atherosclerotic heart disease of native coronary artery without angina pectoris: Secondary | ICD-10-CM | POA: Diagnosis not present

## 2013-10-21 DIAGNOSIS — G0491 Myelitis, unspecified: Secondary | ICD-10-CM | POA: Diagnosis not present

## 2013-10-22 NOTE — Telephone Encounter (Signed)
Can you clarify which form of metoprolol this patient should be taking? Thanks, MI

## 2013-10-22 NOTE — Telephone Encounter (Signed)
Our records indicate pt is taking Toprol 25 mg by mouth daily so this refill is not appropriate. Pt has also recently been hospitalized at [facility] and changes to medication may have been made at that time but we have not seen pt since hospitalization

## 2013-10-28 ENCOUNTER — Encounter: Payer: Self-pay | Admitting: Family Medicine

## 2013-10-28 DIAGNOSIS — R918 Other nonspecific abnormal finding of lung field: Secondary | ICD-10-CM | POA: Insufficient documentation

## 2013-10-30 ENCOUNTER — Other Ambulatory Visit: Payer: Self-pay

## 2013-10-30 ENCOUNTER — Encounter: Payer: Self-pay | Admitting: Cardiovascular Disease

## 2013-10-30 ENCOUNTER — Ambulatory Visit (INDEPENDENT_AMBULATORY_CARE_PROVIDER_SITE_OTHER): Payer: Medicare Other | Admitting: Cardiovascular Disease

## 2013-10-30 VITALS — BP 140/80 | HR 63 | Temp 97.6°F | Ht 69.0 in | Wt 148.0 lb

## 2013-10-30 DIAGNOSIS — I10 Essential (primary) hypertension: Secondary | ICD-10-CM

## 2013-10-30 DIAGNOSIS — I4891 Unspecified atrial fibrillation: Secondary | ICD-10-CM | POA: Diagnosis not present

## 2013-10-30 DIAGNOSIS — N184 Chronic kidney disease, stage 4 (severe): Secondary | ICD-10-CM

## 2013-10-30 DIAGNOSIS — I2510 Atherosclerotic heart disease of native coronary artery without angina pectoris: Secondary | ICD-10-CM

## 2013-10-30 MED ORDER — WARFARIN SODIUM 5 MG PO TABS: 5.0000 mg | ORAL_TABLET | Freq: Every day | ORAL | Status: DC

## 2013-10-30 MED ORDER — CLOPIDOGREL BISULFATE 75 MG PO TABS: 75.0000 mg | ORAL_TABLET | Freq: Every day | ORAL | Status: DC

## 2013-10-30 NOTE — Progress Notes (Signed)
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 He was admitted to [facility] 12/05/12 with anterior STEMI complicated by ventricular fibrillation and cardiogenic shock. A drug eluting eluting stent (3.0 x 38 mm Promus Premier post-dilated with a 3.5 [place] balloon) was placed in the LAD. Small non-dominant RCA was occluded at beginning of case and reperfused with medical therapy during case. An IABP was placed and he required support with pressor agents for 48 hours post cath. He was placed on amiodarone but this was stopped before discharge secondary to bradycardia. Echo 12/06/12 with preserved LV systolic function. Cath also showed a moderately severe stenosis in a moderate caliber intermediate branch which we chose to manage medically. I arranged a stress myoview after his August 2014 appt but he cancelled. He was admitted to [facility] November 2014 with N/V, diarrhea, confusion, gait instability after radiation therapy to right cheek. Negative hypercoagulable workup. Vasculitis labs negative. 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 TEE without left atrial thrombus. 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 Event monitor 09/27/13  showed atrial fibrillation. He discussed anticoagulation with the Neurology clinic at [facility]) but wanted to hold off on anti-coagulation until he was seen in our office. He has been continued on ASA and Brilinta.   He is here today for follow up. He has no energy, constant fatigue. No chest pain or SOB. No awareness of irregularity of his heart rhythm.   Primary Care Physician: Elsie Stain  Nephrology: Dr. Dayle Points at [facility] ENT: Dr. Colin Benton [facility]) Neurology: [facility])  [place]  Last Lipid Profile:Lipid Panel     Component Value Date/Time   CHOL 114 12/06/2012 0647   TRIG 224* 12/06/2012 0647   HDL 23* 12/06/2012 0647   CHOLHDL 5.0 12/06/2012 0647   VLDL 45* 12/06/2012 0647   LDLCALC 46 12/06/2012 0647     Past Medical History  Diagnosis Date  . CAD (coronary artery disease)   . Hypertension   . Hypothyroidism   . Benign prostatic hypertrophy     a. Nonobstructive on 2004 cath b. anterior STEMI s/p DES-pLAD 12/05/12  . Hyperlipidemia   . Diabetes mellitus without complication   . Stroke 2009  . GERD (gastroesophageal reflux disease)   . Cancer     skin cancer  . History of shingles 08/2012    Lt eye shingles  . 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 Creat was 6 when he rec'd a deceased donor transplant 2010/10/08 at [facility].  According to patient he did OK then in the first few weeks after surgery had to go back in hospital for "BP" problems that "messed the kidney up". Since then creatinine has been in 2.3-3.0 range.  Sees only [place] nephrologists, gets most care there.  Takes prograf and Myfortic, no steroids.  PKD kidneys were left in place.    . Chronic kidney disease (CKD), stage IV (severe) 12/08/2012    Of renal transplant, done 2010/10/08 at [facility]   . Cardiac arrest 12/05/2012    In the setting of anterior STEMI    Past Surgical History  Procedure Laterality Date  . Kidney transplant      10/08/10  . Other surgical history  11/21/12  cancerous cells removed from mid upper chest  . Eye surgery    . Other surgical history N/A 11/15/2012    pt had skin cancer removed from mid upper chest  . Coronary angioplasty with stent placement  12/05/2012    30% oLAD, 20% mLAD, 95-99% pLAD s/p DES, 60% oD1, 70-80% pRamus, 40% pLCx, 50% AV groove Cx, 50% PLB, 100% mRCA s/p recanalization intra-procedurally, noted to be small non-dominant with 70% pRCA stenosis post-procedure  . Cardiac catheterization  2004    Nonobstructive     Current Outpatient Prescriptions  Medication Sig Dispense Refill  . aspirin EC 81 MG tablet Take 81 mg by mouth daily.      Marland Kitchen atorvastatin (LIPITOR) 40 MG tablet TAKE ONE BY MOUTH MONDAY, WEDNESDAY AND FRIDAY  90 tablet  1  . BRILINTA 90 MG TABS tablet TAKE 1 TABLET (90 MG TOTAL) BY MOUTH 2 (TWO) TIMES DAILY.  60 tablet  3  . esomeprazole (NEXIUM) 40 MG capsule Take 40 mg by mouth daily at 12 noon.      . furosemide (LASIX) 40 MG tablet Take 40 mg by mouth daily.      . hydrALAZINE (APRESOLINE) 50 MG tablet Take 50 mg by mouth 4 (four) times daily.      Marland Kitchen levothyroxine (SYNTHROID, LEVOTHROID) 125 MCG tablet Take 125 mcg by mouth daily.      Marland Kitchen losartan (COZAAR) 25 MG tablet Take 25 mg by mouth 2 (two) times daily.       . Magnesium Chloride (SLOW-MAG PO) Take 1 tablet by mouth 2 (two) times daily.      . metoprolol succinate (TOPROL-XL) 25 MG 24 hr tablet Take 1 tablet (25 mg total) by mouth daily.  90 tablet  3  . mycophenolate (MYFORTIC) 180 MG EC tablet Take 180 mg by mouth 4 (four) times daily.       . nitroGLYCERIN (NITROSTAT) 0.4 MG SL tablet Place 1 tablet (0.4 mg total) under the tongue every 5 (five) minutes x 3 doses as needed for chest pain.  25 tablet  3  . ondansetron (ZOFRAN) 4 MG tablet Take 4 mg by mouth every 8 (eight) hours as needed for nausea or vomiting.      Marland Kitchen Propylene Glycol (SYSTANE BALANCE OP) Place 1 drop into both eyes 4 (four) times daily.      . sodium bicarbonate 650 MG tablet Take 650 mg by mouth 3 (three) times daily.       . tacrolimus (PROGRAF) 1 MG capsule Take 3 mg by mouth 2 (two) times daily.      . Valerian 450 MG CAPS Take by mouth. Take 1 tab every other day       No current facility-administered medications for this visit.    No Known Allergies  History   Social History  . Marital Status: Married    Spouse Name: N/A    Number of Children: N/A  . Years of Education: N/A   Occupational History  . 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 Smoking status: Never Smoker   . Smokeless tobacco: Never Used  . Alcohol Use: No  . Drug Use: No  . Sexual Activity: Yes   Other Topics Concern  . Not on file   Social History Narrative   Running lawn care service, part time.     Married 1966  Family History  Problem Relation Age of Onset  . Kidney failure Mother     was on dialysis 3 times a week when she died  . Alcohol abuse Mother   . Hypertension Father   . Heart attack Sister   . Kidney disease Sister   . Aneurysm Sister   . COPD Sister   . Colon cancer Neg Hx   . Prostate cancer Neg Hx     Review of Systems:  As stated in the HPI and otherwise negative.   BP 140/80  Ht 5\' 9"  (1.753 m)  Wt 148 lb (67.132 kg)  BMI 21.85 kg/m2  Physical Examination: General: Well developed, well nourished, NAD HEENT: OP clear, mucus membranes moist SKIN: warm, dry. No rashes. Neuro: No focal deficits Musculoskeletal: Muscle strength 5/5 all ext Psychiatric: Mood and affect normal Neck: No JVD, no carotid bruits, no thyromegaly, no lymphadenopathy. Lungs:Clear bilaterally, no wheezes, rhonci, crackles Cardiovascular: Regular rate and rhythm. No murmurs, gallops or rubs. Abdomen:Soft. Bowel sounds present. Non-tender.  Extremities: No lower extremity edema. Pulses are 2 + in the bilateral DP/PT.  EKG: Sinus, rate 63 bpm.LVH. Poor R wave progression precordial leads. Non-specific T wave abnormalities.   Cardiac cath 12/05/12:  Left main: 30% ostial stenosis. 20% mid stenosis.  Left Anterior Descending Artery: Large caliber vessel that courses to the apex. The proximal vessel is hazy with diffuse 95-99% stenosis extending from the ostium down to the first diagonal branch. The remainder of the LAD has no flow limiting disease. The diagonal branch is patent ostial 60% stenosis.  Ramus Intermediate: Moderate caliber vessel with 70-80% proximal  stenosis.  Circumflex Artery: Large caliber, dominant vessel with 40% proximal stenosis. The first obtuse marginal branch is small in caliber with no obstructive disease. The second obtuse marginal branch is small with no disease. The distal AV groove Circumflex has a focal 50% stenosis. The left posterolateral branch has 50% stenosis.  Right Coronary Artery: Small caliber (1.75 mm) non-dominant vessel with 100% mid occlusion. (This vessel recanalized during the procedure and at the end of the procedure was noted to be small, non-dominant with a proximal 70% stenosis.   Echo 12/06/12:  Left ventricle: The cavity size was normal. Wall thickness was increased in a pattern of mild LVH. Systolic function was normal. The estimated ejection fraction was in the range of 60% to 65%. Wall motion was normal; there were no regional wall motion abnormalities. - Right ventricle: The cavity size was mildly dilated. Systolic function was mildly reduced.  Assessment and Plan:   1. CAD: Anterior STEMI March 1096 complicated by incessant VF and cardiogenic shock s/p DES x 1 proximal LAD. Small non-dominant RCA was occluded at beginning of case and reperfused with medical therapy during case. Moderate disease in intermediate branch which we have managed medically. Echo 12/06/12 with preserved LV systolic function. Will stop Brilinta and start Plavix 75 mg po Qdaily. Will stop ASA and will start coumadin for his new onset atrial fibrillation.  Continue statin, beta blocker and ARB. Stop Nexium since adding Plavix.   2. Chronic kidney disease, stage III s/p kidney transplant: Stable. Followed in Nephrology.   3. HTN: BP controlled. No changes today.   4. Paroxysmal, atrial fibrillation: New onset, documented on event monitor at [facility]. I have reviewed the etiology of atrial fibrillation as well as risks of CVA. CHADS-VASC score of 4. He is high risk of future embolic events. Will not use novel agent with his  history of  renal insufficiency and transplanted kidney. Will start coumadin 5 mg po Qdaily and have him schedule in coumadin clinic Monday of next week.

## 2013-10-30 NOTE — Patient Instructions (Addendum)
Your physician recommends that you schedule a follow-up appointment in:  3-4 months.   Please schedule new patient Coumadin Clinic appointment for November 04, 2013  Your physician has recommended you make the following change in your medication: Stop Aspirin.  Stop Brilinta. Stop Nexium.  Start Warfarin 5 mg by mouth daily.  Start Clopidogrel 75 mg by mouth daily

## 2013-11-01 DIAGNOSIS — R413 Other amnesia: Secondary | ICD-10-CM | POA: Diagnosis not present

## 2013-11-01 DIAGNOSIS — I679 Cerebrovascular disease, unspecified: Secondary | ICD-10-CM | POA: Diagnosis not present

## 2013-11-01 DIAGNOSIS — G0490 Encephalitis and encephalomyelitis, unspecified: Secondary | ICD-10-CM | POA: Diagnosis not present

## 2013-11-01 DIAGNOSIS — B2790 Infectious mononucleosis, unspecified without complication: Secondary | ICD-10-CM | POA: Diagnosis not present

## 2013-11-01 DIAGNOSIS — F29 Unspecified psychosis not due to a substance or known physiological condition: Secondary | ICD-10-CM | POA: Diagnosis not present

## 2013-11-04 ENCOUNTER — Other Ambulatory Visit: Payer: Self-pay | Admitting: Pharmacist

## 2013-11-04 ENCOUNTER — Ambulatory Visit (INDEPENDENT_AMBULATORY_CARE_PROVIDER_SITE_OTHER): Payer: Medicare Other | Admitting: Pharmacist

## 2013-11-04 DIAGNOSIS — I4891 Unspecified atrial fibrillation: Secondary | ICD-10-CM

## 2013-11-08 DIAGNOSIS — D899 Disorder involving the immune mechanism, unspecified: Secondary | ICD-10-CM | POA: Diagnosis not present

## 2013-11-08 DIAGNOSIS — F329 Major depressive disorder, single episode, unspecified: Secondary | ICD-10-CM | POA: Diagnosis not present

## 2013-11-08 DIAGNOSIS — R918 Other nonspecific abnormal finding of lung field: Secondary | ICD-10-CM | POA: Diagnosis not present

## 2013-11-08 DIAGNOSIS — F29 Unspecified psychosis not due to a substance or known physiological condition: Secondary | ICD-10-CM | POA: Diagnosis not present

## 2013-11-08 DIAGNOSIS — Q613 Polycystic kidney, unspecified: Secondary | ICD-10-CM | POA: Diagnosis not present

## 2013-11-08 DIAGNOSIS — F3289 Other specified depressive episodes: Secondary | ICD-10-CM | POA: Diagnosis not present

## 2013-11-08 DIAGNOSIS — I6350 Cerebral infarction due to unspecified occlusion or stenosis of unspecified cerebral artery: Secondary | ICD-10-CM | POA: Diagnosis not present

## 2013-11-08 DIAGNOSIS — N189 Chronic kidney disease, unspecified: Secondary | ICD-10-CM | POA: Diagnosis not present

## 2013-11-11 ENCOUNTER — Emergency Department (HOSPITAL_COMMUNITY): Payer: Medicare Other

## 2013-11-11 ENCOUNTER — Encounter (HOSPITAL_COMMUNITY): Payer: Self-pay | Admitting: Emergency Medicine

## 2013-11-11 ENCOUNTER — Inpatient Hospital Stay (HOSPITAL_COMMUNITY)
Admission: EM | Admit: 2013-11-11 | Discharge: 2013-11-13 | DRG: 086 | Disposition: A | Discharge status: Home / Self Care | Payer: Medicare Other | Attending: Internal Medicine | Admitting: Internal Medicine

## 2013-11-11 DIAGNOSIS — S06330A Contusion and laceration of cerebrum, unspecified, without loss of consciousness, initial encounter: Secondary | ICD-10-CM | POA: Diagnosis not present

## 2013-11-11 DIAGNOSIS — F329 Major depressive disorder, single episode, unspecified: Secondary | ICD-10-CM | POA: Diagnosis present

## 2013-11-11 DIAGNOSIS — F3289 Other specified depressive episodes: Secondary | ICD-10-CM | POA: Diagnosis present

## 2013-11-11 DIAGNOSIS — Z940 Kidney transplant status: Secondary | ICD-10-CM | POA: Diagnosis not present

## 2013-11-11 DIAGNOSIS — I252 Old myocardial infarction: Secondary | ICD-10-CM | POA: Diagnosis not present

## 2013-11-11 DIAGNOSIS — S199XXA Unspecified injury of neck, initial encounter: Secondary | ICD-10-CM | POA: Diagnosis not present

## 2013-11-11 DIAGNOSIS — I672 Cerebral atherosclerosis: Secondary | ICD-10-CM | POA: Diagnosis present

## 2013-11-11 DIAGNOSIS — E039 Hypothyroidism, unspecified: Secondary | ICD-10-CM | POA: Diagnosis present

## 2013-11-11 DIAGNOSIS — Z7902 Long term (current) use of antithrombotics/antiplatelets: Secondary | ICD-10-CM

## 2013-11-11 DIAGNOSIS — S0100XA Unspecified open wound of scalp, initial encounter: Secondary | ICD-10-CM | POA: Diagnosis not present

## 2013-11-11 DIAGNOSIS — IMO0002 Reserved for concepts with insufficient information to code with codable children: Secondary | ICD-10-CM | POA: Diagnosis present

## 2013-11-11 DIAGNOSIS — T45515A Adverse effect of anticoagulants, initial encounter: Secondary | ICD-10-CM | POA: Diagnosis present

## 2013-11-11 DIAGNOSIS — E119 Type 2 diabetes mellitus without complications: Secondary | ICD-10-CM | POA: Diagnosis present

## 2013-11-11 DIAGNOSIS — N184 Chronic kidney disease, stage 4 (severe): Secondary | ICD-10-CM

## 2013-11-11 DIAGNOSIS — I129 Hypertensive chronic kidney disease with stage 1 through stage 4 chronic kidney disease, or unspecified chronic kidney disease: Secondary | ICD-10-CM | POA: Diagnosis present

## 2013-11-11 DIAGNOSIS — I4891 Unspecified atrial fibrillation: Secondary | ICD-10-CM | POA: Diagnosis not present

## 2013-11-11 DIAGNOSIS — F411 Generalized anxiety disorder: Secondary | ICD-10-CM | POA: Diagnosis present

## 2013-11-11 DIAGNOSIS — S06339A Contusion and laceration of cerebrum, unspecified, with loss of consciousness of unspecified duration, initial encounter: Secondary | ICD-10-CM

## 2013-11-11 DIAGNOSIS — Y9229 Other specified public building as the place of occurrence of the external cause: Secondary | ICD-10-CM

## 2013-11-11 DIAGNOSIS — Z79899 Other long term (current) drug therapy: Secondary | ICD-10-CM

## 2013-11-11 DIAGNOSIS — R791 Abnormal coagulation profile: Secondary | ICD-10-CM

## 2013-11-11 DIAGNOSIS — Z8674 Personal history of sudden cardiac arrest: Secondary | ICD-10-CM

## 2013-11-11 DIAGNOSIS — I6350 Cerebral infarction due to unspecified occlusion or stenosis of unspecified cerebral artery: Secondary | ICD-10-CM | POA: Diagnosis present

## 2013-11-11 DIAGNOSIS — S0990XA Unspecified injury of head, initial encounter: Secondary | ICD-10-CM | POA: Diagnosis not present

## 2013-11-11 DIAGNOSIS — S0993XA Unspecified injury of face, initial encounter: Secondary | ICD-10-CM | POA: Diagnosis not present

## 2013-11-11 DIAGNOSIS — R4182 Altered mental status, unspecified: Secondary | ICD-10-CM

## 2013-11-11 DIAGNOSIS — Z85828 Personal history of other malignant neoplasm of skin: Secondary | ICD-10-CM | POA: Diagnosis not present

## 2013-11-11 DIAGNOSIS — I6200 Nontraumatic subdural hemorrhage, unspecified: Secondary | ICD-10-CM | POA: Diagnosis not present

## 2013-11-11 DIAGNOSIS — R001 Bradycardia, unspecified: Secondary | ICD-10-CM

## 2013-11-11 DIAGNOSIS — K219 Gastro-esophageal reflux disease without esophagitis: Secondary | ICD-10-CM | POA: Diagnosis present

## 2013-11-11 DIAGNOSIS — I498 Other specified cardiac arrhythmias: Secondary | ICD-10-CM | POA: Diagnosis not present

## 2013-11-11 DIAGNOSIS — Z841 Family history of disorders of kidney and ureter: Secondary | ICD-10-CM

## 2013-11-11 DIAGNOSIS — S0633AA Contusion and laceration of cerebrum, unspecified, with loss of consciousness status unknown, initial encounter: Secondary | ICD-10-CM

## 2013-11-11 DIAGNOSIS — W19XXXA Unspecified fall, initial encounter: Secondary | ICD-10-CM

## 2013-11-11 DIAGNOSIS — N400 Enlarged prostate without lower urinary tract symptoms: Secondary | ICD-10-CM | POA: Diagnosis present

## 2013-11-11 DIAGNOSIS — S069XAA Unspecified intracranial injury with loss of consciousness status unknown, initial encounter: Secondary | ICD-10-CM | POA: Diagnosis not present

## 2013-11-11 DIAGNOSIS — Z8673 Personal history of transient ischemic attack (TIA), and cerebral infarction without residual deficits: Secondary | ICD-10-CM

## 2013-11-11 DIAGNOSIS — W010XXA Fall on same level from slipping, tripping and stumbling without subsequent striking against object, initial encounter: Secondary | ICD-10-CM | POA: Diagnosis present

## 2013-11-11 DIAGNOSIS — I10 Essential (primary) hypertension: Secondary | ICD-10-CM | POA: Diagnosis present

## 2013-11-11 DIAGNOSIS — Q612 Polycystic kidney, adult type: Secondary | ICD-10-CM | POA: Diagnosis not present

## 2013-11-11 DIAGNOSIS — I2510 Atherosclerotic heart disease of native coronary artery without angina pectoris: Secondary | ICD-10-CM

## 2013-11-11 DIAGNOSIS — E785 Hyperlipidemia, unspecified: Secondary | ICD-10-CM | POA: Diagnosis present

## 2013-11-11 DIAGNOSIS — S066XAA Traumatic subarachnoid hemorrhage with loss of consciousness status unknown, initial encounter: Secondary | ICD-10-CM | POA: Diagnosis not present

## 2013-11-11 DIAGNOSIS — F0150 Vascular dementia without behavioral disturbance: Secondary | ICD-10-CM | POA: Diagnosis present

## 2013-11-11 DIAGNOSIS — Z8619 Personal history of other infectious and parasitic diseases: Secondary | ICD-10-CM

## 2013-11-11 DIAGNOSIS — Z6379 Other stressful life events affecting family and household: Secondary | ICD-10-CM

## 2013-11-11 DIAGNOSIS — Z8249 Family history of ischemic heart disease and other diseases of the circulatory system: Secondary | ICD-10-CM

## 2013-11-11 DIAGNOSIS — S066X9A Traumatic subarachnoid hemorrhage with loss of consciousness of unspecified duration, initial encounter: Secondary | ICD-10-CM | POA: Diagnosis not present

## 2013-11-11 DIAGNOSIS — T1490XA Injury, unspecified, initial encounter: Secondary | ICD-10-CM | POA: Diagnosis not present

## 2013-11-11 DIAGNOSIS — S069X9A Unspecified intracranial injury with loss of consciousness of unspecified duration, initial encounter: Secondary | ICD-10-CM | POA: Diagnosis not present

## 2013-11-11 DIAGNOSIS — Z9861 Coronary angioplasty status: Secondary | ICD-10-CM

## 2013-11-11 LAB — CBC WITH DIFFERENTIAL/PLATELET
BASOS ABS: 0 10*3/uL (ref 0.0–0.1)
Basophils Relative: 0 % (ref 0–1)
EOS ABS: 0 10*3/uL (ref 0.0–0.7)
EOS PCT: 0 % (ref 0–5)
HCT: 38.6 % — ABNORMAL LOW (ref 39.0–52.0)
Hemoglobin: 12.9 g/dL — ABNORMAL LOW (ref 13.0–17.0)
Lymphocytes Relative: 8 % — ABNORMAL LOW (ref 12–46)
Lymphs Abs: 0.8 10*3/uL (ref 0.7–4.0)
MCH: 29.3 pg (ref 26.0–34.0)
MCHC: 33.4 g/dL (ref 30.0–36.0)
MCV: 87.7 fL (ref 78.0–100.0)
Monocytes Absolute: 0.5 10*3/uL (ref 0.1–1.0)
Monocytes Relative: 5 % (ref 3–12)
NEUTROS PCT: 87 % — AB (ref 43–77)
Neutro Abs: 9.2 10*3/uL — ABNORMAL HIGH (ref 1.7–7.7)
Platelets: 165 10*3/uL (ref 150–400)
RBC: 4.4 MIL/uL (ref 4.22–5.81)
RDW: 15.6 % — AB (ref 11.5–15.5)
WBC: 10.6 10*3/uL — ABNORMAL HIGH (ref 4.0–10.5)

## 2013-11-11 LAB — MAGNESIUM: MAGNESIUM: 1.7 mg/dL (ref 1.5–2.5)

## 2013-11-11 LAB — CBG MONITORING, ED: Glucose-Capillary: 113 mg/dL — ABNORMAL HIGH (ref 70–99)

## 2013-11-11 LAB — GLUCOSE, CAPILLARY: GLUCOSE-CAPILLARY: 152 mg/dL — AB (ref 70–99)

## 2013-11-11 LAB — PROTIME-INR
INR: 5.31 (ref 0.00–1.49)
Prothrombin Time: 46.5 seconds — ABNORMAL HIGH (ref 11.6–15.2)

## 2013-11-11 LAB — MRSA PCR SCREENING: MRSA by PCR: NEGATIVE

## 2013-11-11 MED ORDER — METOPROLOL SUCCINATE ER 25 MG PO TB24
25.0000 mg | ORAL_TABLET | Freq: Every day | ORAL | Status: DC
  Administered 2013-11-13: 25 mg via ORAL
  Filled 2013-11-11 (×2): qty 1

## 2013-11-11 MED ORDER — INSULIN ASPART 100 UNIT/ML ~~LOC~~ SOLN: 0.0000 [IU] | Freq: Three times a day (TID) | SUBCUTANEOUS | Status: DC

## 2013-11-11 MED ORDER — SODIUM CHLORIDE 0.9 % IV SOLN
INTRAVENOUS | Status: AC
  Administered 2013-11-11: 20:00:00 via INTRAVENOUS

## 2013-11-11 MED ORDER — ONDANSETRON HCL 4 MG PO TABS: 4.0000 mg | ORAL_TABLET | Freq: Four times a day (QID) | ORAL | Status: DC | PRN

## 2013-11-11 MED ORDER — MAGNESIUM CHLORIDE 64 MG PO TBEC
1.0000 | DELAYED_RELEASE_TABLET | Freq: Two times a day (BID) | ORAL | Status: DC
  Administered 2013-11-11 – 2013-11-13 (×4): 64 mg via ORAL
  Filled 2013-11-11 (×5): qty 1

## 2013-11-11 MED ORDER — POLYVINYL ALCOHOL 1.4 % OP SOLN
1.0000 [drp] | Freq: Four times a day (QID) | OPHTHALMIC | Status: DC
  Administered 2013-11-11 – 2013-11-13 (×3): 1 [drp] via OPHTHALMIC
  Filled 2013-11-11: qty 15

## 2013-11-11 MED ORDER — HYDRALAZINE HCL 10 MG PO TABS
10.0000 mg | ORAL_TABLET | Freq: Three times a day (TID) | ORAL | Status: DC
  Administered 2013-11-11 – 2013-11-12 (×2): 10 mg via ORAL
  Filled 2013-11-11 (×5): qty 1

## 2013-11-11 MED ORDER — PANTOPRAZOLE SODIUM 40 MG PO TBEC
40.0000 mg | DELAYED_RELEASE_TABLET | Freq: Every day | ORAL | Status: DC
  Administered 2013-11-12: 40 mg via ORAL
  Filled 2013-11-11: qty 1

## 2013-11-11 MED ORDER — PHYTONADIONE 5 MG PO TABS
2.5000 mg | ORAL_TABLET | Freq: Once | ORAL | Status: AC
  Administered 2013-11-11: 2.5 mg via ORAL
  Filled 2013-11-11: qty 1

## 2013-11-11 MED ORDER — SODIUM BICARBONATE 650 MG PO TABS
650.0000 mg | ORAL_TABLET | Freq: Two times a day (BID) | ORAL | Status: DC
  Administered 2013-11-11 – 2013-11-13 (×4): 650 mg via ORAL
  Filled 2013-11-11 (×5): qty 1

## 2013-11-11 MED ORDER — ACETAMINOPHEN 325 MG PO TABS: 650.0000 mg | ORAL_TABLET | Freq: Four times a day (QID) | ORAL | Status: DC | PRN

## 2013-11-11 MED ORDER — ONDANSETRON HCL 4 MG/2ML IJ SOLN: 4.0000 mg | Freq: Four times a day (QID) | INTRAMUSCULAR | Status: DC | PRN

## 2013-11-11 MED ORDER — ATORVASTATIN CALCIUM 10 MG PO TABS
10.0000 mg | ORAL_TABLET | Freq: Every evening | ORAL | Status: DC
  Administered 2013-11-11 – 2013-11-12 (×2): 10 mg via ORAL
  Filled 2013-11-11 (×3): qty 1

## 2013-11-11 MED ORDER — ACETAMINOPHEN 650 MG RE SUPP: 650.0000 mg | Freq: Four times a day (QID) | RECTAL | Status: DC | PRN

## 2013-11-11 MED ORDER — PREDNISONE 10 MG PO TABS
10.0000 mg | ORAL_TABLET | Freq: Every day | ORAL | Status: DC
  Administered 2013-11-12 – 2013-11-13 (×2): 10 mg via ORAL
  Filled 2013-11-11 (×3): qty 1

## 2013-11-11 MED ORDER — SERTRALINE HCL 100 MG PO TABS
100.0000 mg | ORAL_TABLET | Freq: Every day | ORAL | Status: DC
  Administered 2013-11-12 – 2013-11-13 (×2): 100 mg via ORAL
  Filled 2013-11-11 (×2): qty 1

## 2013-11-11 MED ORDER — PROPYLENE GLYCOL 0.6 % OP SOLN: 1.0000 [drp] | Freq: Four times a day (QID) | OPHTHALMIC | Status: DC

## 2013-11-11 NOTE — ED Provider Notes (Signed)
Medical screening examination/treatment/procedure(s) were conducted as a shared visit with non-physician practitioner(s) and myself.  I personally evaluated the patient during the encounter.   EKG Interpretation   Date/Time:  Monday November 11 2013 11:17:32 EST Ventricular Rate:  63 PR Interval:  130 QRS Duration: 84 QT Interval:  419 QTC Calculation: 429 R Axis:   -10 Text Interpretation:  Sinus rhythm Consider anterior infarct Borderline T  abnormalities, inferior leads No significant change since last tracing  Confirmed by Lariyah Shetterly  MD, Chelsie Burel [phone]) on 11/11/2013 11:27:01 AM      Patient seen by me. History also confirmed by patient's wife. Patient was at [facility] today for routine visit. He had a fall did not lose consciousness he hit his head on the right side. Patient has a history of atrophic of and is on Coumadin. They put Steri-Strips on the head wound. Which seems to measure up about 2 cm. Patient will need head CT Coumadin level checked. Initially we thought there was a change in mental status but according to patient's wife he is baseline mental status. He has been having some metal status changes over the past several weeks and is followed by neurology for that. Referred here because of the fall head injury and being on Coumadin. Patient will need head CT CT of neck and Coumadin check. Patient neurologically is grossly intact.    Mervin Kung, MD 11/11/13 [phone]

## 2013-11-11 NOTE — ED Notes (Signed)
[facility] for evaluation of fall this morning at the facility. EMS states pt recently dx with a-fib, was going to have INR levels checked when pt became dizzy and fell. Pt denies LOC, pt is on coumadin. Pt in c-collar by facility, denies any pain. Laceration noted to top of left head. Pt alert and oriented, skin warm and dry, nad noted.

## 2013-11-11 NOTE — ED Notes (Signed)
Harris, PA made aware of INR 5.31

## 2013-11-11 NOTE — ED Provider Notes (Signed)
CSN: [id number]     Arrival date & time 11/11/13  1059 History   First MD Initiated Contact with Patient 11/11/13 1104     Chief Complaint  Patient presents with  . Fall     (Consider location/radiation/quality/duration/timing/severity/associated sxs/prior Treatment) HPI This is a 68 year old male with a past medical history of STEMI, chronic kidney disease, history of cardiac arrest, recently diagnosed with a failed, on Coumadin who presents from the community [facility] and while there sent her via EMS for fall.  History is obtained from EMS report as well as the patient's wife as the patient has some baseline disorientation.  The patient was [facility] for [facility] and monitoring.  He had a mechanical fall and hit his right temple against the wall causing some bleeding.  Wife denies any hematochexia or melena.  He has been otherwise well. Past Medical History  Diagnosis Date  . CAD (coronary artery disease)   . Hypertension   . Hypothyroidism   . Benign prostatic hypertrophy     a. Nonobstructive on 2004 cath b. anterior STEMI s/p DES-pLAD 12/05/12  . Hyperlipidemia   . Diabetes mellitus without complication   . Stroke 2009  . GERD (gastroesophageal reflux disease)   . Cancer     skin cancer  . History of shingles 08/2012    Lt eye shingles  . 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 Creat was 6 when he rec'd a deceased donor transplant 09/25/2010 at [facility].  According to patient he did OK then in the first few weeks after surgery had to go back in hospital for "BP" problems that "messed the kidney up". Since then creatinine has been in 2.3-3.0 range.  Sees only [place] nephrologists, gets most care there.  Takes prograf and Myfortic, no steroids.  PKD kidneys were left in place.    . Chronic kidney disease (CKD), stage IV (severe) 12/08/2012    Of renal transplant, done 2010/09/25 at [facility]   . Cardiac arrest 12/05/2012    In the  setting of anterior STEMI   Past Surgical History  Procedure Laterality Date  . Kidney transplant      Sep 25, 2010  . Other surgical history  11/21/12    cancerous cells removed from mid upper chest  . Eye surgery    . Other surgical history N/A 11/15/2012    pt had skin cancer removed from mid upper chest  . Coronary angioplasty with stent placement  12/05/2012    30% oLAD, 20% mLAD, 95-99% pLAD s/p DES, 60% oD1, 70-80% pRamus, 40% pLCx, 50% AV groove Cx, 50% PLB, 100% mRCA s/p recanalization intra-procedurally, noted to be small non-dominant with 70% pRCA stenosis post-procedure  . Cardiac catheterization  2004    Nonobstructive   Family History  Problem Relation Age of Onset  . Kidney failure Mother     was on dialysis 3 times a week when she died  . Alcohol abuse Mother   . Hypertension Father   . Heart attack Sister   . Kidney disease Sister   . Aneurysm Sister   . COPD Sister   . Colon cancer Neg Hx   . Prostate cancer Neg Hx    History  Substance Use Topics  . Smoking status: Never Smoker   . Smokeless tobacco: Never Used  . Alcohol Use: No    Review of Systems  Unable to perform ROS  Allergies  Review of patient's allergies indicates no known allergies.  Home Medications   Current Outpatient Rx  Name  Route  Sig  Dispense  Refill  . atorvastatin (LIPITOR) 40 MG tablet      TAKE ONE BY MOUTH MONDAY, WEDNESDAY AND FRIDAY   90 tablet   1   . clopidogrel (PLAVIX) 75 MG tablet   Oral   Take 1 tablet (75 mg total) by mouth daily.   30 tablet   6   . furosemide (LASIX) 40 MG tablet   Oral   Take 40 mg by mouth daily.         . hydrALAZINE (APRESOLINE) 50 MG tablet   Oral   Take 50 mg by mouth 4 (four) times daily.         Marland Kitchen levothyroxine (SYNTHROID, LEVOTHROID) 125 MCG tablet   Oral   Take 125 mcg by mouth daily.         Marland Kitchen losartan (COZAAR) 25 MG tablet   Oral   Take 25 mg by mouth 2 (two) times daily.          . Magnesium Chloride  (SLOW-MAG PO)   Oral   Take 1 tablet by mouth 2 (two) times daily.         . metoprolol succinate (TOPROL-XL) 25 MG 24 hr tablet   Oral   Take 1 tablet (25 mg total) by mouth daily.   90 tablet   3   . mycophenolate (MYFORTIC) 180 MG EC tablet   Oral   Take 180 mg by mouth 4 (four) times daily.          . nitroGLYCERIN (NITROSTAT) 0.4 MG SL tablet   Sublingual   Place 1 tablet (0.4 mg total) under the tongue every 5 (five) minutes x 3 doses as needed for chest pain.   25 tablet   3   . ondansetron (ZOFRAN) 4 MG tablet   Oral   Take 4 mg by mouth every 8 (eight) hours as needed for nausea or vomiting.         Marland Kitchen Propylene Glycol (SYSTANE BALANCE OP)   Both Eyes   Place 1 drop into both eyes 4 (four) times daily.         . sodium bicarbonate 650 MG tablet   Oral   Take 650 mg by mouth 3 (three) times daily.          . tacrolimus (PROGRAF) 1 MG capsule   Oral   Take 3 mg by mouth 2 (two) times daily.         . Valerian 450 MG CAPS   Oral   Take by mouth. Take 1 tab every other day         . warfarin (COUMADIN) 5 MG tablet   Oral   Take 1 tablet (5 mg total) by mouth daily.   30 tablet   0    BP 158/80  Pulse 63  Temp(Src) 98.2 F (36.8 C) (Oral)  Resp 18  SpO2 99% Physical Exam Physical Exam  Nursing note and vitals reviewed. Constitutional: He appears well-developed and well-nourished. No distress.  HENT:  Head: Normocephalic and atraumatic.  Eyes: Conjunctivae normal are normal. No scleral icterus.  Neck: Normal range of motion. Neck supple.  Cardiovascular: Normal rate, regular rhythm and normal heart sounds.   Pulmonary/Chest: Effort normal and breath sounds normal. No respiratory distress.  Abdominal: Soft. There is no tenderness.  Musculoskeletal: He exhibits no edema.  Neurological: He is alert. Disoriented to day of the week, reason for visit. Follows most commands Major Cranial nerves without deficit, no facial droop Normal  strength in upper and lower extremities bilaterally including dorsiflexion and plantar flexion, strong and equal grip strength Sensation normal to light and sharp touch Moves extremities without ataxia, coordination intact Normal finger to nose and rapid alternating movements Neg romberg, no pronator drift Normal gait Skin: Skin is warm and dry. He is not diaphoretic.  3 cm laceration of the right temporal.  Steri-Strips in place.  Her symptoms being of blood.     ED Course  Procedures (including critical care time) Labs Review Labs Reviewed  CBC WITH DIFFERENTIAL - Abnormal; Notable for the following:    WBC 10.6 (*)    Hemoglobin 12.9 (*)    HCT 38.6 (*)    RDW 15.6 (*)    Neutrophils Relative % 87 (*)    Neutro Abs 9.2 (*)    Lymphocytes Relative 8 (*)    All other components within normal limits  PROTIME-INR - Abnormal; Notable for the following:    Prothrombin Time 46.5 (*)    INR 5.31 (*)    All other components within normal limits  CBG MONITORING, ED - Abnormal; Notable for the following:    Glucose-Capillary 113 (*)    All other components within normal limits   Imaging Review Ct Head Wo Contrast  11/11/2013   CLINICAL DATA:  Confusion.  Possible seizures.  Recent fall.  EXAM: CT HEAD WITHOUT CONTRAST  CT CERVICAL SPINE WITHOUT CONTRAST  TECHNIQUE: Multidetector CT imaging of the head and cervical spine was performed following the standard protocol without intravenous contrast. Multiplanar CT image reconstructions of the cervical spine were also generated.  COMPARISON:  Head CT 06/07/2006.  FINDINGS: CT HEAD FINDINGS  Well-defined large area of low attenuation in the left cerebellar hemisphere, new compared to prior study 06/07/2006, but most compatible with an area of encephalomalacia related to old left cerebellar hemisphere infarct. There is also a slightly ill-defined area of low attenuation in the right frontal lobe white matter with effacement of the overlying cortex  best demonstrated on images 17-20 of series 2. Mild cerebral atrophy. Numerous well-defined foci of low attenuation in the basal ganglia bilaterally, compatible with old lacunar infarctions. Patchy and confluent areas of decreased attenuation are noted throughout the deep and periventricular white matter of the cerebral hemispheres bilaterally, compatible with chronic microvascular ischemic disease. No evidence of acute intracranial hemorrhage, significant mass effect, hydrocephalus or abnormal intra or extra-axial fluid collections. Visualized paranasal sinuses and mastoids are well pneumatized. No acute displaced skull fractures are noted.  CT CERVICAL SPINE FINDINGS  No acute displaced fractures of the cervical spine. Alignment is anatomic. Prevertebral soft tissues are normal. Mild multilevel degenerative disc disease, most severe at C6-C7. Mild multilevel facet arthropathy. Visualized portions of the upper thorax are unremarkable.  IMPRESSION: 1. Slightly ill-defined area of low attenuation in the right frontal lobe subcortical white matter with effacement of the overlying cortex. Given the history of recent fall, the possibility of a contusion in this region is not excluded. Alternatively, this could be seen in the setting of acute/subacute cerebral infarction. Less likely, an underlying metastatic lesion or other primary mass lesion could be present. For further evaluation of these findings, MRI of the brain with and without IV gadolinium is suggested at this time. 2. Additional chronic ischemic changes including chronic ischemic changes in the cerebral white matter, old chronic coronary  infarctions of the basal ganglia bilaterally, and a large area of encephalomalacia in the left cerebellar hemisphere. 3. No acute abnormality of the cervical spine. Mild multilevel degenerative disc disease and cervical spondylosis. These results were called by telephone at the time of interpretation on 11/11/2013 at 2:01 PM to  Dr. Margarita Mail, who verbally acknowledged these results.   Electronically Signed   By: Vinnie Langton M.D.   On: 11/11/2013 14:01   Ct Cervical Spine Wo Contrast  11/11/2013   CLINICAL DATA:  Confusion.  Possible seizures.  Recent fall.  EXAM: CT HEAD WITHOUT CONTRAST  CT CERVICAL SPINE WITHOUT CONTRAST  TECHNIQUE: Multidetector CT imaging of the head and cervical spine was performed following the standard protocol without intravenous contrast. Multiplanar CT image reconstructions of the cervical spine were also generated.  COMPARISON:  Head CT 06/07/2006.  FINDINGS: CT HEAD FINDINGS  Well-defined large area of low attenuation in the left cerebellar hemisphere, new compared to prior study 06/07/2006, but most compatible with an area of encephalomalacia related to old left cerebellar hemisphere infarct. There is also a slightly ill-defined area of low attenuation in the right frontal lobe white matter with effacement of the overlying cortex best demonstrated on images 17-20 of series 2. Mild cerebral atrophy. Numerous well-defined foci of low attenuation in the basal ganglia bilaterally, compatible with old lacunar infarctions. Patchy and confluent areas of decreased attenuation are noted throughout the deep and periventricular white matter of the cerebral hemispheres bilaterally, compatible with chronic microvascular ischemic disease. No evidence of acute intracranial hemorrhage, significant mass effect, hydrocephalus or abnormal intra or extra-axial fluid collections. Visualized paranasal sinuses and mastoids are well pneumatized. No acute displaced skull fractures are noted.  CT CERVICAL SPINE FINDINGS  No acute displaced fractures of the cervical spine. Alignment is anatomic. Prevertebral soft tissues are normal. Mild multilevel degenerative disc disease, most severe at C6-C7. Mild multilevel facet arthropathy. Visualized portions of the upper thorax are unremarkable.  IMPRESSION: 1. Slightly ill-defined  area of low attenuation in the right frontal lobe subcortical white matter with effacement of the overlying cortex. Given the history of recent fall, the possibility of a contusion in this region is not excluded. Alternatively, this could be seen in the setting of acute/subacute cerebral infarction. Less likely, an underlying metastatic lesion or other primary mass lesion could be present. For further evaluation of these findings, MRI of the brain with and without IV gadolinium is suggested at this time. 2. Additional chronic ischemic changes including chronic ischemic changes in the cerebral white matter, old chronic coronary infarctions of the basal ganglia bilaterally, and a large area of encephalomalacia in the left cerebellar hemisphere. 3. No acute abnormality of the cervical spine. Mild multilevel degenerative disc disease and cervical spondylosis. These results were called by telephone at the time of interpretation on 11/11/2013 at 2:01 PM to Dr. Margarita Mail, who verbally acknowledged these results.   Electronically Signed   By: Vinnie Langton M.D.   On: 11/11/2013 14:01     EKG Interpretation   Date/Time:  Monday November 11 2013 11:17:32 EST Ventricular Rate:  63 PR Interval:  130 QRS Duration: 84 QT Interval:  419 QTC Calculation: 429 R Axis:   -10 Text Interpretation:  Sinus rhythm Consider anterior infarct Borderline T  abnormalities, inferior leads No significant change since last tracing  Confirmed by ZACKOWSKI  MD, SCOTT [phone]) on 11/11/2013 11:27:01 AM      CRITICAL CARE Performed by: Margarita Mail   Total critical care  time: 40  Critical care time was exclusive of separately billable procedures and treating other patients.  Critical care was necessary to treat or prevent imminent or life-threatening deterioration.  Critical care was time spent personally by me on the following activities: development of treatment plan with patient and/or surrogate as well as nursing,  discussions with consultants, evaluation of patient's response to treatment, examination of patient, obtaining history from patient or surrogate, ordering and performing treatments and interventions, ordering and review of laboratory studies, ordering and review of radiographic studies, pulse oximetry and re-evaluation of patient's condition.   MDM   Final diagnoses:  Cerebral contusion  Supratherapeutic INR  Altered mental status  Fall  Laceration    Patient with INR of 5.5. He also has a leukocytosis with left shift.  CT head and C-spine are pending.  At this point the patient does not need vitamin K.  To evaluate for internal bleeding. Laceration to R forehead repaired pta.  2:05PM Spoke with Dr, Weber Cooks about Patient's CT FIndings of abnormal lesion R frontal lobe. I have ordered MR of brain. I also spoke with patient's wife who states he has a history of Oral cancer [facility] at [facility] with [facility].  Dr. Nevada Crane called ot discuss te findings on the MRI. The patient MRI show multiple old lacunar infarcts. He appears to have cerebral hematoma, there appears to be trace sub-arachnoid bleeding as well. In light of the patient's supratherapeutic INR and bleeding. I feel the patient will need admission. Dr. Nevada Crane recommends repeat CT in 24 hours . Patient's mental status stable and hemodynamically stable here in the ED.   The patient appears reasonably stabilized for admission considering the current resources, flow, and capabilities available in the ED at this time, and I doubt any other [facility] requiring further screening and/or treatment in the ED prior to admission. Patient admitted to Step down by Dr. Dyann Kief.   Margarita Mail, PA-C 11/12/13 G. L. Garcia, PA-C 11/12/13 [phone]

## 2013-11-11 NOTE — ED Notes (Addendum)
pjt wife at bedside, reports that pt has been "confused alittle the past couple of weeks" states they have been seeing a neurologist about possibility of "mini seizures" because "mental status will change and go back to normal." Dr. Earnest Conroy made at bedside

## 2013-11-11 NOTE — H&P (Signed)
[facility] History and Physical  Edward Oconnell LOV:[id number] DOB: 12-10-1945 DOA: 11/11/2013  Referring physician: Margarita Mail, PA PCP: Edward Stain, MD   Chief Complaint: supratherapeutic INR, head trauma with trace of SAH on CT/MRI  HPI: Edward Oconnell is a 68 y.o. male with PMH significant for CAD, HTN, hypothyroidism, HLD, DM, hx of stroke, CKD stage 4, hx of cardiac arrest in settings of inferior MI and atrial fibrillation (recent diagnosed and stated on coumadin); came to ED after experiencing a head trauma while visiting PCP  To check coumadin level today (started on coumadin approx 10 days ago). Patient reports he lost balance and stumble hitting his head with a wall; never LOC and denies any palpitation, dizziness, lightheadedness, CP or any prodromic symptoms prior to losing balance.  Patient ended requiring open wound on his right frontotemporal lobe and received some steri-strips to clse it. Due to trauma was sent to ED for further evaluation. In the ED patient was found with INR 5.1 and positive CT/MRI of the head for cerebral contusion and trace SAH. No focal neurologic deficit appreciated. [facility] called to admit patient for further evaluation and treatment.   Review of Systems:  Negative except as mentioned on HPI.  Past Medical History  Diagnosis Date  . CAD (coronary artery disease)   . Hypertension   . Hypothyroidism   . Benign prostatic hypertrophy     a. Nonobstructive on 2004 cath b. anterior STEMI s/p DES-pLAD 12/05/12  . Hyperlipidemia   . Diabetes mellitus without complication   . Stroke 2009  . GERD (gastroesophageal reflux disease)   . Cancer     skin cancer  . History of shingles 08/2012    Lt eye shingles  . 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 Creat was 6 when he rec'd a deceased donor transplant September 24, 2010 at [facility].  According to patient he did OK then in the first few weeks after surgery had to go back in hospital  for "BP" problems that "messed the kidney up". Since then creatinine has been in 2.3-3.0 range.  Sees only [facility] neph[facility]ists, gets most care there.  Takes prograf and Myfortic, no steroids.  PKD kidneys were left in place.    . Chronic kidney disease (CKD), stage IV (severe) 12/08/2012    Of renal transplant, done 2010/09/24 at [facility]   . Cardiac arrest 12/05/2012    In the setting of anterior STEMI   Past Surgical History  Procedure Laterality Date  . Kidney transplant      09-24-10  . Other surgical history  11/21/12    cancerous cells removed from mid upper chest  . Eye surgery    . Other surgical history N/A 11/15/2012    pt had skin cancer removed from mid upper chest  . Coronary angioplasty with stent placement  12/05/2012    30% oLAD, 20% mLAD, 95-99% pLAD s/p DES, 60% oD1, 70-80% pRamus, 40% pLCx, 50% AV groove Cx, 50% PLB, 100% mRCA s/p recanalization intra-procedurally, noted to be small non-dominant with 70% pRCA stenosis post-procedure  . Cardiac catheterization  2004    Nonobstructive   Social History:  reports that he has never smoked. He has never used smokeless tobacco. He reports that he does not drink alcohol or use illicit drugs.  No Known Allergies  Family History  Problem Relation Age of Onset  . Kidney failure Mother     was on dialysis 3 times a  week when she died  . Alcohol abuse Mother   . Hypertension Father   . Heart attack Sister   . Kidney disease Sister   . Aneurysm Sister   . COPD Sister   . Colon cancer Neg Hx   . Prostate cancer Neg Hx      Prior to Admission medications   Medication Sig Start Date End Date Taking? Authorizing Provider  atorvastatin (LIPITOR) 10 MG tablet Take 10 mg by mouth every evening.  08/15/13  Yes Historical Provider, MD  clopidogrel (PLAVIX) 75 MG tablet Take 1 tablet (75 mg total) by mouth daily. 10/30/13  Yes Burnell Blanks, MD  hydrALAZINE (APRESOLINE) 25 MG tablet Take 25 mg by mouth daily.  08/13/13 08/13/14 Yes  Historical Provider, MD  Magnesium Chloride (SLOW-MAG PO) Take 1 tablet by mouth 2 (two) times daily.   Yes Historical Provider, MD  metoprolol succinate (TOPROL-XL) 25 MG 24 hr tablet Take 1 tablet (25 mg total) by mouth daily. 03/25/13  Yes Roger A Arguello, PA-C  ondansetron (ZOFRAN) 4 MG tablet Take 4 mg by mouth every 8 (eight) hours as needed for nausea or vomiting.   Yes Historical Provider, MD  Propylene Glycol (SYSTANE BALANCE OP) Place 1 drop into both eyes 4 (four) times daily.   Yes Historical Provider, MD  sertraline (ZOLOFT) 100 MG tablet Take 100 mg by mouth daily.  10/18/13  Yes Historical Provider, MD  sodium bicarbonate 650 MG tablet Take 650 mg by mouth 2 (two) times daily.    Yes Historical Provider, MD  Valerian 450 MG CAPS Take by mouth. Take 1 tab every other day   Yes Historical Provider, MD  warfarin (COUMADIN) 5 MG tablet Take 1 tablet (5 mg total) by mouth daily. 10/30/13  Yes Burnell Blanks, MD  nitroGLYCERIN (NITROSTAT) 0.4 MG SL tablet Place 1 tablet (0.4 mg total) under the tongue every 5 (five) minutes x 3 doses as needed for chest pain. 12/12/12   Meriel Pica, PA-C   Physical Exam: Filed Vitals:   11/11/13 1800  BP:   Pulse:   Temp: 98.2 F (36.8 C)  Resp:     BP 141/91  Pulse 58  Temp(Src) 98.2 F (36.8 C) (Oral)  Resp 23  SpO2 98%  General:  Appears calm and comfortable; no acute distress, AAOX3; right frontotemporal space with suture wound; dry blood appreciated. Eyes: PERRL, normal lids, irises & conjunctiva; no nystagmus ENT: grossly normal hearing, lips & tongue; no erythema or exudates inside his mouth Neck: no LAD, masses or thyromegaly, no JVD Cardiovascular: irregular, no rubs or gallops; no LE edema Telemetry: atrial fibrillation, rate controlled. Respiratory: CTA bilaterally, no w/r/r. Normal respiratory effort. Abdomen: soft, nt, nd; positive BS Skin: no rash or induration seen on limited exam Musculoskeletal: grossly normal  tone BUE/BLE Psychiatric: grossly normal mood and affect, speech fluent and appropriate Neurologic: grossly non-focal.          CBC:  Recent Labs Lab 11/11/13 1150  WBC 10.6*  NEUTROABS 9.2*  HGB 12.9*  HCT 38.6*  MCV 87.7  PLT 165   CBG:  Recent Labs Lab 11/11/13 1143  GLUCAP 113*    Radiological Exams on Admission: Ct Head Wo Contrast  11/11/2013   CLINICAL DATA:  Confusion.  Possible seizures.  Recent fall.  EXAM: CT HEAD WITHOUT CONTRAST  CT CERVICAL SPINE WITHOUT CONTRAST  TECHNIQUE: Multidetector CT imaging of the head and cervical spine was performed following the standard protocol without intravenous contrast.  Multiplanar CT image reconstructions of the cervical spine were also generated.  COMPARISON:  Head CT 06/07/2006.  FINDINGS: CT HEAD FINDINGS  Well-defined large area of low attenuation in the left cerebellar hemisphere, new compared to prior study 06/07/2006, but most compatible with an area of encephalomalacia related to old left cerebellar hemisphere infarct. There is also a slightly ill-defined area of low attenuation in the right frontal lobe white matter with effacement of the overlying cortex best demonstrated on images 17-20 of series 2. Mild cerebral atrophy. Numerous well-defined foci of low attenuation in the basal ganglia bilaterally, compatible with old lacunar infarctions. Patchy and confluent areas of decreased attenuation are noted throughout the deep and periventricular white matter of the cerebral hemispheres bilaterally, compatible with chronic microvascular ischemic disease. No evidence of acute intracranial hemorrhage, significant mass effect, hydrocephalus or abnormal intra or extra-axial fluid collections. Visualized paranasal sinuses and mastoids are well pneumatized. No acute displaced skull fractures are noted.  CT CERVICAL SPINE FINDINGS  No acute displaced fractures of the cervical spine. Alignment is anatomic. Prevertebral soft tissues are normal.  Mild multilevel degenerative disc disease, most severe at C6-C7. Mild multilevel facet arthropathy. Visualized portions of the upper thorax are unremarkable.  IMPRESSION: 1. Slightly ill-defined area of low attenuation in the right frontal lobe subcortical white matter with effacement of the overlying cortex. Given the history of recent fall, the possibility of a contusion in this region is not excluded. Alternatively, this could be seen in the setting of acute/subacute cerebral infarction. Less likely, an underlying metastatic lesion or other primary mass lesion could be present. For further evaluation of these findings, MRI of the brain with and without IV gadolinium is suggested at this time. 2. Additional chronic ischemic changes including chronic ischemic changes in the cerebral white matter, old chronic coronary infarctions of the basal ganglia bilaterally, and a large area of encephalomalacia in the left cerebellar hemisphere. 3. No acute abnormality of the cervical spine. Mild multilevel degenerative disc disease and cervical spondylosis. These results were called by telephone at the time of interpretation on 11/11/2013 at 2:01 PM to Dr. Margarita Oconnell, who verbally acknowledged these results.   Electronically Signed   By: Vinnie Langton M.D.   On: 11/11/2013 14:01   Ct Cervical Spine Wo Contrast  11/11/2013   CLINICAL DATA:  Confusion.  Possible seizures.  Recent fall.  EXAM: CT HEAD WITHOUT CONTRAST  CT CERVICAL SPINE WITHOUT CONTRAST  TECHNIQUE: Multidetector CT imaging of the head and cervical spine was performed following the standard protocol without intravenous contrast. Multiplanar CT image reconstructions of the cervical spine were also generated.  COMPARISON:  Head CT 06/07/2006.  FINDINGS: CT HEAD FINDINGS  Well-defined large area of low attenuation in the left cerebellar hemisphere, new compared to prior study 06/07/2006, but most compatible with an area of encephalomalacia related to old left  cerebellar hemisphere infarct. There is also a slightly ill-defined area of low attenuation in the right frontal lobe white matter with effacement of the overlying cortex best demonstrated on images 17-20 of series 2. Mild cerebral atrophy. Numerous well-defined foci of low attenuation in the basal ganglia bilaterally, compatible with old lacunar infarctions. Patchy and confluent areas of decreased attenuation are noted throughout the deep and periventricular white matter of the cerebral hemispheres bilaterally, compatible with chronic microvascular ischemic disease. No evidence of acute intracranial hemorrhage, significant mass effect, hydrocephalus or abnormal intra or extra-axial fluid collections. Visualized paranasal sinuses and mastoids are well pneumatized. No acute displaced skull  fractures are noted.  CT CERVICAL SPINE FINDINGS  No acute displaced fractures of the cervical spine. Alignment is anatomic. Prevertebral soft tissues are normal. Mild multilevel degenerative disc disease, most severe at C6-C7. Mild multilevel facet arthropathy. Visualized portions of the upper thorax are unremarkable.  IMPRESSION: 1. Slightly ill-defined area of low attenuation in the right frontal lobe subcortical white matter with effacement of the overlying cortex. Given the history of recent fall, the possibility of a contusion in this region is not excluded. Alternatively, this could be seen in the setting of acute/subacute cerebral infarction. Less likely, an underlying metastatic lesion or other primary mass lesion could be present. For further evaluation of these findings, MRI of the brain with and without IV gadolinium is suggested at this time. 2. Additional chronic ischemic changes including chronic ischemic changes in the cerebral white matter, old chronic coronary infarctions of the basal ganglia bilaterally, and a large area of encephalomalacia in the left cerebellar hemisphere. 3. No acute abnormality of the  cervical spine. Mild multilevel degenerative disc disease and cervical spondylosis. These results were called by telephone at the time of interpretation on 11/11/2013 at 2:01 PM to Dr. Margarita Oconnell, who verbally acknowledged these results.   Electronically Signed   By: Vinnie Langton M.D.   On: 11/11/2013 14:01   Mr Brain Wo Contrast  11/11/2013   CLINICAL DATA:  68 year old male status post fall with possible seizure and abnormal head CT. Initial encounter. On Coumadin.  Renal insufficiency precludes gadolinium contrast at this time.  EXAM: MRI HEAD WITHOUT CONTRAST  TECHNIQUE: Multiplanar, multiecho pulse sequences of the brain and surrounding structures were obtained without intravenous contrast.  COMPARISON:  Head CT without contrast 11/11/2013, 06/07/2006.  FINDINGS: Small cortically based areas of restricted diffusion in the bilateral anterior frontal lobes (series 3, image 19). These changes are superimposed on anterior frontal lobe cortical encephalomalacia (series 7, image 16). Additionally there is a small focus of white matter restricted diffusion near the corpus callosum just above the left lateral ventricle (same image and series 4, image 15).  There is a small focus of T2 shine through in the left periatrial white matter. No other restricted diffusion identified.  On T2* imaging, there is questionable trace blood products along the anterior frontal lobe diffusion site (series 8, image 15), and also at the right vertex (image 18).  Underlying advanced nonspecific Patchy and confluent cerebral white matter T2 and FLAIR hyperintensity. Dilated perivascular spaces in the basal ganglia. Chronic lacunar infarcts in the brainstem, and chronic bilateral cerebellar infarcts with encephalomalacia.  No ventriculomegaly. No intraventricular hemorrhage. No subdural or epidural blood identified. Patent basilar cisterns. Partially empty sella configuration. Negative cervicomedullary junction and visualized  cervical spine. Normal bone marrow signal.  Visualized orbit soft tissues are within normal limits. No scalp hematoma identified. There is chronic soft tissue scarring and decreased soft tissue volume in the right pre malar space. Small volume fluid or mucosal thickening in the right sphenoid sinus. Mastoids are clear.  IMPRESSION: 1. Anterior bifrontal lobe and some convexity signal abnormality, with diffusion and T2* signal changes, favored to reflect small posttraumatic cerebral contusions and trace subarachnoid hemorrhage in this setting. No associated mass effect, or confluent intracranial hemorrhage. 2. Underlying advanced chronic ischemic changes in the brain, therefore acute to subacute ischemia is the main differential consideration for the diffusion changes in #1. 3. There is a small focus of white matter diffusion abnormality at the left body of the corpus callosum, which in keeping  with the above differential diagnosis could represent shear injury versus acute small vessel ischemia. 4. Follow-up / surveillance non contrast head CT recommended in 24 hr (1300 hr 11/12/2013) unless clinically indicated sooner. Study discussed by telephone with ABIGAIL HARRIS on 11/11/2013 at 16:43 .   Electronically Signed   By: Lars Pinks M.D.   On: 11/11/2013 16:58    EKG:  Ventricular Rate: 63  PR Interval: 130  QRS Duration: 84  QT Interval: 419  QTC Calculation: 429  R Axis: -10  Text Interpretation: Sinus rhythm Consider anterior infarct Borderline T  abnormalities, inferior leads No significant change since last tracing    Assessment/Plan 1-Cerebral contusion without loss of consciousness: after head trauma today while visiting coumadin clinic; patient loss balance and stumble against at wall. -CT head w/o contrast and MRI done in ED suggesting small bifrontal cerebral contusion and traces of SAH. -neurosurgery consulted and plsn is for observation and to repeat non-contrast CT in 24 hours -patient  neurologically intact -will avoid NSAID's and heparin -coumadin will be partially reversed and place on hold for now. -no plavix -will discuss with neurosx when is appropriate to resume meds.   2-HYPOTHYROIDISM: will check TSH. Currently not on synthroid.  3-DIABETES MELLITUS, TYPE II: will check AIC and use SSI.  4-HYPERTENSION: stable. Will continue current medication regimen.  5-hx of STROKE: patient with findings suggesting lacunar strokes, new for [place] since prior brain images; but patient reports he is follow at [facility] and was told he had a stroke. -continue risk factors modifications -coumadin on hold due to traces of SAH at this moment.  6-Chronic kidney disease (CKD), stage IV (severe): currently stable. Will monitor.  7-GERD (gastroesophageal reflux disease): continue PPI  8-Atrial fibrillation: rate controlled. Not coumadin for now. 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  DVT: SCD's  Neurosurgery (Dr. Joya Salm)  Code Status: Full Family Communication: no family at bedside Disposition Plan: LOS < 2 midnights, stepdown, observation  Time spent: 40 minutes  Hudson Majkowski [facility] Pager [phone]

## 2013-11-11 NOTE — Progress Notes (Signed)
Pt found standing at side of bed peeing on floor.  Pt appeared confused. Pt reoriented, cleaned up, and put back in bed.  Call bed given.  Will monitor pt.

## 2013-11-11 NOTE — Consult Note (Signed)
Reason for Consult:chi Referring Physician: er  Edward Oconnell is an 68 y.o. male.  HPI: patient who was today st his doctor office, fainted and fell. He was there to have his INR checked, which started 10 days ago, because atrial fibrillation. Transferred to [place] we were called because some low density in the frontal area. No blledig  Past Medical History  Diagnosis Date  . CAD (coronary artery disease)   . Hypertension   . Hypothyroidism   . Benign prostatic hypertrophy     a. Nonobstructive on 2004 cath b. anterior STEMI s/p DES-pLAD 12/05/12  . Hyperlipidemia   . Diabetes mellitus without complication   . Stroke 2009  . GERD (gastroesophageal reflux disease)   . Cancer     skin cancer  . History of shingles 08/2012    Lt eye shingles  . 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 Creat was 6 when he rec'd a deceased donor transplant 2010-09-26 at [facility].  According to patient he did OK then in the first few weeks after surgery had to go back in hospital for "BP" problems that "messed the kidney up". Since then creatinine has been in 2.3-3.0 range.  Sees only [place] nephrologists, gets most care there.  Takes prograf and Myfortic, no steroids.  PKD kidneys were left in place.    . Chronic kidney disease (CKD), stage IV (severe) 12/08/2012    Of renal transplant, done [facility] 26, [facility] at [facility]   . Cardiac arrest 12/05/2012    In the setting of anterior STEMI    Past Surgical History  Procedure Laterality Date  . Kidney transplant      09-26-2010  . Other surgical history  11/21/12    cancerous cells removed from mid upper chest  . Eye surgery    . Other surgical history N/A 11/15/2012    pt had skin cancer removed from mid upper chest  . Coronary angioplasty with stent placement  12/05/2012    30% oLAD, 20% mLAD, 95-99% pLAD s/p DES, 60% oD1, 70-80% pRamus, 40% pLCx, 50% AV groove Cx, 50% PLB, 100% mRCA s/p recanalization intra-procedurally, noted to be small non-dominant  with 70% pRCA stenosis post-procedure  . Cardiac catheterization  2004    Nonobstructive    Family History  Problem Relation Age of Onset  . Kidney failure Mother     was on dialysis 3 times a week when she died  . Alcohol abuse Mother   . Hypertension Father   . Heart attack Sister   . Kidney disease Sister   . Aneurysm Sister   . COPD Sister   . Colon cancer Neg Hx   . Prostate cancer Neg Hx     Social History:  reports that he has never smoked. He has never used smokeless tobacco. He reports that he does not drink alcohol or use illicit drugs.  Allergies: No Known Allergies  Medications:see HP  Results for orders placed during the hospital encounter of 11/11/13 (from the past 48 hour(s))  CBG MONITORING, ED     Status: Abnormal   Collection Time    11/11/13 11:43 AM      Result Value Ref Range   Glucose-Capillary 113 (*) 70 - 99 mg/dL   Comment 1 Documented in Chart     Comment 2 Notify RN    CBC WITH DIFFERENTIAL     Status: Abnormal   Collection Time    11/11/13 11:50 AM  Result Value Ref Range   WBC 10.6 (*) 4.0 - 10.5 K/uL   RBC 4.40  4.22 - 5.81 MIL/uL   Hemoglobin 12.9 (*) 13.0 - 17.0 g/dL   HCT 38.6 (*) 39.0 - 52.0 %   MCV 87.7  78.0 - 100.0 fL   MCH 29.3  26.0 - 34.0 pg   MCHC 33.4  30.0 - 36.0 g/dL   RDW 15.6 (*) 11.5 - 15.5 %   Platelets 165  150 - 400 K/uL   Neutrophils Relative % 87 (*) 43 - 77 %   Neutro Abs 9.2 (*) 1.7 - 7.7 K/uL   Lymphocytes Relative 8 (*) 12 - 46 %   Lymphs Abs 0.8  0.7 - 4.0 K/uL   Monocytes Relative 5  3 - 12 %   Monocytes Absolute 0.5  0.1 - 1.0 K/uL   Eosinophils Relative 0  0 - 5 %   Eosinophils Absolute 0.0  0.0 - 0.7 K/uL   Basophils Relative 0  0 - 1 %   Basophils Absolute 0.0  0.0 - 0.1 K/uL  PROTIME-INR     Status: Abnormal   Collection Time    11/11/13 11:50 AM      Result Value Ref Range   Prothrombin Time 46.5 (*) 11.6 - 15.2 seconds   INR 5.31 (*) 0.00 - 1.49   Comment: REPEATED TO VERIFY      CRITICAL RESULT CALLED TO, READ BACK BY AND VERIFIED WITH:     L SHERWOOD,RN 1259 11/11/13 D BRADLEY    Ct Head Wo Contrast  11/11/2013   CLINICAL DATA:  Confusion.  Possible seizures.  Recent fall.  EXAM: CT HEAD WITHOUT CONTRAST  CT CERVICAL SPINE WITHOUT CONTRAST  TECHNIQUE: Multidetector CT imaging of the head and cervical spine was performed following the standard protocol without intravenous contrast. Multiplanar CT image reconstructions of the cervical spine were also generated.  COMPARISON:  Head CT 06/07/2006.  FINDINGS: CT HEAD FINDINGS  Well-defined large area of low attenuation in the left cerebellar hemisphere, new compared to prior study 06/07/2006, but most compatible with an area of encephalomalacia related to old left cerebellar hemisphere infarct. There is also a slightly ill-defined area of low attenuation in the right frontal lobe white matter with effacement of the overlying cortex best demonstrated on images 17-20 of series 2. Mild cerebral atrophy. Numerous well-defined foci of low attenuation in the basal ganglia bilaterally, compatible with old lacunar infarctions. Patchy and confluent areas of decreased attenuation are noted throughout the deep and periventricular white matter of the cerebral hemispheres bilaterally, compatible with chronic microvascular ischemic disease. No evidence of acute intracranial hemorrhage, significant mass effect, hydrocephalus or abnormal intra or extra-axial fluid collections. Visualized paranasal sinuses and mastoids are well pneumatized. No acute displaced skull fractures are noted.  CT CERVICAL SPINE FINDINGS  No acute displaced fractures of the cervical spine. Alignment is anatomic. Prevertebral soft tissues are normal. Mild multilevel degenerative disc disease, most severe at C6-C7. Mild multilevel facet arthropathy. Visualized portions of the upper thorax are unremarkable.  IMPRESSION: 1. Slightly ill-defined area of low attenuation in the right frontal  lobe subcortical white matter with effacement of the overlying cortex. Given the history of recent fall, the possibility of a contusion in this region is not excluded. Alternatively, this could be seen in the setting of acute/subacute cerebral infarction. Less likely, an underlying metastatic lesion or other primary mass lesion could be present. For further evaluation of these findings, MRI of the Oconnell with  and without IV gadolinium is suggested at this time. 2. Additional chronic ischemic changes including chronic ischemic changes in the cerebral white matter, old chronic coronary infarctions of the basal ganglia bilaterally, and a large area of encephalomalacia in the left cerebellar hemisphere. 3. No acute abnormality of the cervical spine. Mild multilevel degenerative disc disease and cervical spondylosis. These results were called by telephone at the time of interpretation on 11/11/2013 at 2:01 PM to Dr. Margarita Mail, who verbally acknowledged these results.   Electronically Signed   By: Vinnie Langton M.D.   On: 11/11/2013 14:01   Ct Cervical Spine Wo Contrast  11/11/2013   CLINICAL DATA:  Confusion.  Possible seizures.  Recent fall.  EXAM: CT HEAD WITHOUT CONTRAST  CT CERVICAL SPINE WITHOUT CONTRAST  TECHNIQUE: Multidetector CT imaging of the head and cervical spine was performed following the standard protocol without intravenous contrast. Multiplanar CT image reconstructions of the cervical spine were also generated.  COMPARISON:  Head CT 06/07/2006.  FINDINGS: CT HEAD FINDINGS  Well-defined large area of low attenuation in the left cerebellar hemisphere, new compared to prior study 06/07/2006, but most compatible with an area of encephalomalacia related to old left cerebellar hemisphere infarct. There is also a slightly ill-defined area of low attenuation in the right frontal lobe white matter with effacement of the overlying cortex best demonstrated on images 17-20 of series 2. Mild cerebral atrophy.  Numerous well-defined foci of low attenuation in the basal ganglia bilaterally, compatible with old lacunar infarctions. Patchy and confluent areas of decreased attenuation are noted throughout the deep and periventricular white matter of the cerebral hemispheres bilaterally, compatible with chronic microvascular ischemic disease. No evidence of acute intracranial hemorrhage, significant mass effect, hydrocephalus or abnormal intra or extra-axial fluid collections. Visualized paranasal sinuses and mastoids are well pneumatized. No acute displaced skull fractures are noted.  CT CERVICAL SPINE FINDINGS  No acute displaced fractures of the cervical spine. Alignment is anatomic. Prevertebral soft tissues are normal. Mild multilevel degenerative disc disease, most severe at C6-C7. Mild multilevel facet arthropathy. Visualized portions of the upper thorax are unremarkable.  IMPRESSION: 1. Slightly ill-defined area of low attenuation in the right frontal lobe subcortical white matter with effacement of the overlying cortex. Given the history of recent fall, the possibility of a contusion in this region is not excluded. Alternatively, this could be seen in the setting of acute/subacute cerebral infarction. Less likely, an underlying metastatic lesion or other primary mass lesion could be present. For further evaluation of these findings, MRI of the Oconnell with and without IV gadolinium is suggested at this time. 2. Additional chronic ischemic changes including chronic ischemic changes in the cerebral white matter, old chronic coronary infarctions of the basal ganglia bilaterally, and a large area of encephalomalacia in the left cerebellar hemisphere. 3. No acute abnormality of the cervical spine. Mild multilevel degenerative disc disease and cervical spondylosis. These results were called by telephone at the time of interpretation on 11/11/2013 at 2:01 PM to Dr. Margarita Mail, who verbally acknowledged these results.    Electronically Signed   By: Vinnie Langton M.D.   On: 11/11/2013 14:01   Edward Oconnell Wo Contrast  11/11/2013   CLINICAL DATA:  68 year old male status post fall with possible seizure and abnormal head CT. Initial encounter. On Coumadin.  Renal insufficiency precludes gadolinium contrast at this time.  EXAM: MRI HEAD WITHOUT CONTRAST  TECHNIQUE: Multiplanar, multiecho pulse sequences of the Oconnell and surrounding structures were obtained without intravenous contrast.  COMPARISON:  Head CT without contrast 11/11/2013, 06/07/2006.  FINDINGS: Small cortically based areas of restricted diffusion in the bilateral anterior frontal lobes (series 3, image 19). These changes are superimposed on anterior frontal lobe cortical encephalomalacia (series 7, image 16). Additionally there is a small focus of white matter restricted diffusion near the corpus callosum just above the left lateral ventricle (same image and series 4, image 15).  There is a small focus of T2 shine through in the left periatrial white matter. No other restricted diffusion identified.  On T2* imaging, there is questionable trace blood products along the anterior frontal lobe diffusion site (series 8, image 15), and also at the right vertex (image 18).  Underlying advanced nonspecific Patchy and confluent cerebral white matter T2 and FLAIR hyperintensity. Dilated perivascular spaces in the basal ganglia. Chronic lacunar infarcts in the brainstem, and chronic bilateral cerebellar infarcts with encephalomalacia.  No ventriculomegaly. No intraventricular hemorrhage. No subdural or epidural blood identified. Patent basilar cisterns. Partially empty sella configuration. Negative cervicomedullary junction and visualized cervical spine. Normal bone marrow signal.  Visualized orbit soft tissues are within normal limits. No scalp hematoma identified. There is chronic soft tissue scarring and decreased soft tissue volume in the right pre malar space. Small volume  fluid or mucosal thickening in the right sphenoid sinus. Mastoids are clear.  IMPRESSION: 1. Anterior bifrontal lobe and some convexity signal abnormality, with diffusion and T2* signal changes, favored to reflect small posttraumatic cerebral contusions and trace subarachnoid hemorrhage in this setting. No associated mass effect, or confluent intracranial hemorrhage. 2. Underlying advanced chronic ischemic changes in the Oconnell, therefore acute to subacute ischemia is the main differential consideration for the diffusion changes in #1. 3. There is a small focus of white matter diffusion abnormality at the left body of the corpus callosum, which in keeping with the above differential diagnosis could represent shear injury versus acute small vessel ischemia. 4. Follow-up / surveillance non contrast head CT recommended in 24 hr (1300 hr 11/12/2013) unless clinically indicated sooner. Study discussed by telephone with ABIGAIL HARRIS on 11/11/2013 at 16:43 .   Electronically Signed   By: Lars Pinks M.D.   On: 11/11/2013 16:58    Review of Systems  Constitutional: Negative.   Eyes: Positive for blurred vision.  Respiratory: Negative.   Cardiovascular: Positive for palpitations.  Musculoskeletal: Myalgias: he was there with his wife to have hiscoumadin check , which he started 10 days ago because atrial fib.   Skin: Negative.   Neurological: Positive for focal weakness and headaches.  Endo/Heme/Allergies: Negative.   Psychiatric/Behavioral: Negative.    Blood pressure 141/91, pulse 58, temperature 98.2 F (36.8 C), temperature source Oral, resp. rate 23, SpO2 98.00%. Physical Examhent. Abrasion right frontal scalp. NECK, NL. CV, SCAR IN MIDLINE. LUNGS, some rales. ABDOMEN, SOFT. EXTREMITIES, NL.Marland Kitchen Neuro, oriented x 3. No weakness. No evidence of bllod in csf or nose. Ct and mri head were seen. Old changes secondary to cva. No surgical lesion  Assessment/Plan: Patient  Admitted for observation. Spoke with him  and wife. wi  F/u prn  India Jolin M 11/11/2013, 6:51 PM

## 2013-11-12 ENCOUNTER — Observation Stay (HOSPITAL_COMMUNITY): Payer: Medicare Other

## 2013-11-12 DIAGNOSIS — S06330A Contusion and laceration of cerebrum, unspecified, without loss of consciousness, initial encounter: Secondary | ICD-10-CM | POA: Diagnosis not present

## 2013-11-12 DIAGNOSIS — I498 Other specified cardiac arrhythmias: Secondary | ICD-10-CM | POA: Diagnosis not present

## 2013-11-12 DIAGNOSIS — S066XAA Traumatic subarachnoid hemorrhage with loss of consciousness status unknown, initial encounter: Secondary | ICD-10-CM | POA: Diagnosis not present

## 2013-11-12 DIAGNOSIS — N184 Chronic kidney disease, stage 4 (severe): Secondary | ICD-10-CM | POA: Diagnosis not present

## 2013-11-12 DIAGNOSIS — S066X9A Traumatic subarachnoid hemorrhage with loss of consciousness of unspecified duration, initial encounter: Secondary | ICD-10-CM | POA: Diagnosis not present

## 2013-11-12 LAB — GLUCOSE, CAPILLARY
GLUCOSE-CAPILLARY: 146 mg/dL — AB (ref 70–99)
Glucose-Capillary: 83 mg/dL (ref 70–99)
Glucose-Capillary: 113 mg/dL — ABNORMAL HIGH (ref 70–99)
Glucose-Capillary: 156 mg/dL — ABNORMAL HIGH (ref 70–99)

## 2013-11-12 LAB — LIPID PANEL
Cholesterol: 176 mg/dL (ref 0–200)
HDL: 40 mg/dL (ref >39)
LDL Cholesterol: 83 mg/dL (ref 0–99)
Total CHOL/HDL Ratio: 4.4 ratio
Triglycerides: 265 mg/dL — ABNORMAL HIGH (ref <150)
VLDL: 53 mg/dL — ABNORMAL HIGH (ref 0–40)

## 2013-11-12 LAB — CBC
HCT: 36.8 % — ABNORMAL LOW (ref 39.0–52.0)
Hemoglobin: 12.1 g/dL — ABNORMAL LOW (ref 13.0–17.0)
MCH: 28.7 pg (ref 26.0–34.0)
MCHC: 32.9 g/dL (ref 30.0–36.0)
MCV: 87.4 fL (ref 78.0–100.0)
PLATELETS: 187 10*3/uL (ref 150–400)
RBC: 4.21 MIL/uL — ABNORMAL LOW (ref 4.22–5.81)
RDW: 15.4 % (ref 11.5–15.5)
WBC: 8.6 10*3/uL (ref 4.0–10.5)

## 2013-11-12 LAB — BASIC METABOLIC PANEL WITH GFR
BUN: 21 mg/dL (ref 6–23)
CO2: 25 meq/L (ref 19–32)
Calcium: 8.6 mg/dL (ref 8.4–10.5)
Chloride: 100 meq/L (ref 96–112)
Creatinine, Ser: 1.53 mg/dL — ABNORMAL HIGH (ref 0.50–1.35)
GFR calc Af Amer: 53 mL/min — ABNORMAL LOW (ref >90)
GFR calc non Af Amer: 45 mL/min — ABNORMAL LOW (ref >90)
Glucose, Bld: 88 mg/dL (ref 70–99)
Potassium: 4.4 meq/L (ref 3.7–5.3)
Sodium: 138 meq/L (ref 137–147)

## 2013-11-12 LAB — HEMOGLOBIN A1C
Hgb A1c MFr Bld: 6.3 % — ABNORMAL HIGH (ref <5.7)
Mean Plasma Glucose: 134 mg/dL — ABNORMAL HIGH (ref <117)

## 2013-11-12 LAB — TSH: TSH: 0.62 u[IU]/mL (ref 0.350–4.500)

## 2013-11-12 LAB — PROTIME-INR
INR: 4.12 — ABNORMAL HIGH (ref 0.00–1.49)
Prothrombin Time: 38.3 s — ABNORMAL HIGH (ref 11.6–15.2)

## 2013-11-12 LAB — VITAMIN B12: Vitamin B-12: 326 pg/mL (ref 211–911)

## 2013-11-12 LAB — SYPHILIS: RPR W/REFLEX TO RPR TITER AND TREPONEMAL ANTIBODIES, TRADITIONAL SCREENING AND DIAGNOSIS ALGORITHM: RPR Ser Ql: NONREACTIVE

## 2013-11-12 MED ORDER — HYDRALAZINE HCL 25 MG PO TABS
25.0000 mg | ORAL_TABLET | Freq: Three times a day (TID) | ORAL | Status: DC
  Administered 2013-11-12 – 2013-11-13 (×3): 25 mg via ORAL
  Filled 2013-11-12 (×6): qty 1

## 2013-11-12 MED ORDER — HYDRALAZINE HCL 25 MG PO TABS: 25.0000 mg | ORAL_TABLET | Freq: Four times a day (QID) | ORAL | Status: DC

## 2013-11-12 NOTE — Progress Notes (Signed)
[facility] Progress Note [place] TEAM 1 - Stepdown/ICU TEAM   JEREMI LOSITO BZJ:[id number] DOB: Apr 19, 1946 DOA: 11/11/2013 PCP: Elsie Stain, MD  Brief narrative: Edward FAUCETT is a 68 y.o. male presenting on 11/11/2013 with  has a past medical history of CAD  Hypertension; Hypothyroidism; Benign prostatic hypertrophy; Hyperlipidemia; Diabetes mellitus without complication; Stroke  GERD Cancer; History of shingles  S/p cadaver renal transplant Chronic kidney disease stage IV and Cardiac arrest and A-fib recently started on coumadin who presents after a fall due to loss of balance. He developed a wound on the right frontal area and an MRI revealing a cerebral contusion and trace SAH.    Subjective: No complaints- has ambulated around the floor without trouble.   Assessment/Plan: Principal Problem:   Cerebral contusion/ SAH - NS following - repeat CT today reveals stable acute infarcts and shear injury in the left cingulate gyrus and bifrontal non-hemorraghic contusions  Active Problems:    Supratherapeutic INR - Vit K given but INR still elevated- as bleed has stabalized, will not give further Vit K or any FFP    HYPOTHYROIDISM - stable    DIABETES MELLITUS, TYPE II - stable    HYPERTENSION - BP high but due to bradycardia (HR in 30s) holding Metoprolol today- will give Hydralazine    Chronic kidney disease (CKD), stage IV (severe) - stable    GERD (gastroesophageal reflux disease) - stable    Atrial fibrillation - currently sinus bradycardia - avoid anticoagulants  Code Status: full code Family Communication: with wife at bedside Disposition Plan: home with outpt PT  Consultants: NS  Procedures: none  Antibiotics: Antibiotics Given (last 72 hours)   None       DVT prophylaxis: SCDs  Objective: Filed Weights   11/12/13 0415  Weight: 65.726 kg (144 lb 14.4 oz)   Blood pressure 143/103, pulse 53, temperature 98.3 F (36.8 C), temperature source  Oral, resp. rate 16, height 5\' 9"  (1.753 m), weight 65.726 kg (144 lb 14.4 oz), SpO2 98.00%.  Intake/Output Summary (Last 24 hours) at 11/12/13 1316 Last data filed at 11/12/13 0600  Gross per 24 hour  Intake 760.83 ml  Output      0 ml  Net 760.83 ml     Exam: General: No acute respiratory distress-  laceration on right forehead not bleeding  Lungs: Clear to auscultation bilaterally without wheezes or crackles Cardiovascular: Regular rate and rhythm without murmur gallop or rub normal S1 and S2 Abdomen: Nontender, nondistended, soft, bowel sounds positive, no rebound, no ascites, no appreciable mass Extremities: No significant cyanosis, clubbing, or edema bilateral lower extremities  Data Reviewed: Basic Metabolic Panel:  Recent Labs Lab 11/11/13 2015 11/12/13 0234  NA  --  138  K  --  4.4  CL  --  100  CO2  --  25  GLUCOSE  --  88  BUN  --  21  CREATININE  --  1.53*  CALCIUM  --  8.6  MG 1.7  --    Liver Function Tests: No results found for this basename: AST, ALT, ALKPHOS, BILITOT, PROT, ALBUMIN,  in the last 168 hours No results found for this basename: LIPASE, AMYLASE,  in the last 168 hours No results found for this basename: AMMONIA,  in the last 168 hours CBC:  Recent Labs Lab 11/11/13 1150 11/12/13 0234  WBC 10.6* 8.6  NEUTROABS 9.2*  --   HGB 12.9* 12.1*  HCT 38.6* 36.8*  MCV 87.7 87.4  PLT 165 187   Cardiac Enzymes: No results found for this basename: CKTOTAL, CKMB, CKMBINDEX, TROPONINI,  in the last 168 hours BNP (last 3 results) No results found for this basename: PROBNP,  in the last 8760 hours CBG:  Recent Labs Lab 11/11/13 1143 11/11/13 2125 11/12/13 0816 11/12/13 1153  GLUCAP 113* 152* 83 113*    Recent Results (from the past 240 hour(s))  MRSA PCR SCREENING     Status: None   Collection Time    11/11/13  7:23 PM      Result Value Ref Range Status   MRSA by PCR NEGATIVE  NEGATIVE Final   Comment:            The GeneXpert MRSA  Assay (FDA     approved for NASAL specimens     only), is one component of a     comprehensive MRSA colonization     surveillance program. It is not     intended to diagnose MRSA     infection nor to guide or     monitor treatment for     MRSA infections.     Studies:  Recent x-ray studies have been reviewed in detail by the Attending Physician  Scheduled Meds:  Scheduled Meds: . atorvastatin  10 mg Oral QPM  . hydrALAZINE  10 mg Oral 3 times per day  . insulin aspart  0-9 Units Subcutaneous TID WC  . magnesium chloride  1 tablet Oral BID  . metoprolol succinate  25 mg Oral Daily  . pantoprazole  40 mg Oral Q1200  . polyvinyl alcohol  1 drop Both Eyes QID  . predniSONE  10 mg Oral Q breakfast  . sertraline  100 mg Oral Daily  . sodium bicarbonate  650 mg Oral BID   Continuous Infusions:   Time spent on care of this patient: >35 min   Debbe Odea, MD  [facility] Office  [phone] Pager - Text Page per Shea Evans as per below:  On-Call/Text Page:      [email or web address]  If 7PM-7AM, please contact night-coverage [email or web address] 11/12/2013, 1:16 PM   LOS: 1 day

## 2013-11-12 NOTE — Progress Notes (Signed)
[facility] completed. [facility]

## 2013-11-12 NOTE — Evaluation (Signed)
Physical Therapy Evaluation Patient Details Name: Edward Oconnell MRN: [id number] DOB: [birth year] Today's Date: 11/12/2013 Time: 1027-2536 PT Time Calculation (min): 29 min  PT Assessment / Plan / Recommendation History of Present Illness  Pt admitted with supratherapeutic INR, head trauma with trace of SAH on CT/MRI.  Clinical Impression  Pt appears to be near baseline. Pt with flat affect and memory deficits prior to admit. Pt with higher level balance deficits and remains at increased falls risk requiring 24/7 supervision for safe d/c home. Spouse to provide 24/7 supervision. Recommend outpt PT to address balance to improve safety with ambulation.     PT Assessment  Patient needs continued PT services    Follow Up Recommendations  Outpatient PT;Supervision/Assistance - 24 hour    Does the patient have the potential to tolerate intense rehabilitation      Barriers to Discharge        Equipment Recommendations  None recommended by PT    Recommendations for Other Services     Frequency Min 3X/week    Precautions / Restrictions Precautions Precautions: Fall Restrictions Weight Bearing Restrictions: No   Pertinent Vitals/Pain Denies pain      Mobility  Bed Mobility Overal bed mobility: Modified Independent General bed mobility comments: increased time, definite use of hands Transfers Overall transfer level: Needs assistance Equipment used: None Transfers: Sit to/from Stand Sit to Stand: Min guard General transfer comment: increased time Ambulation/Gait Ambulation/Gait assistance: Min guard Ambulation Distance (Feet): 150 Feet Assistive device: None Gait Pattern/deviations: Step-through pattern;Decreased stride length Gait velocity: wfl for age General Gait Details: per pt spouse, pt amb better with other people because he focuses. She reports at home he has increased trunk flexion and shuffled gait pattern. pt with no episodes of LOB during ambulation    Exercises      PT Diagnosis: Difficulty walking  PT Problem List: Decreased balance;Decreased mobility;Decreased activity tolerance PT Treatment Interventions: DME instruction;Gait training;Stair training;Functional mobility training;Therapeutic activities;Therapeutic exercise;Balance training;Neuromuscular re-education     PT Goals(Current goals can be found in the care plan section) Acute Rehab PT Goals Patient Stated Goal: home PT Goal Formulation: With patient/family Time For Goal Achievement: 11/19/13 Potential to Achieve Goals: Good Additional Goals Additional Goal #1: Pt to achieve >19 on DGI to indicate minimal falls risk.  Visit Information  Last PT Received On: 11/12/13 Assistance Needed: +1 History of Present Illness: Pt admitted with supratherapeutic INR, head trauma with trace of SAH on CT/MRI.       Prior [place] expects to be discharged to:: Private residence Living Arrangements: Spouse/significant other Available Help at Discharge: Family;Available 24 hours/day Type of Home: House Home Access: Stairs to enter [employer] of Steps: 2 Entrance Stairs-Rails: Can reach both Home Layout: 1/2 bath on main level Prior Function Level of Independence: Independent Comments: wife reports pt not having any falls at home. Pt spouse reports pt's personality has changed since november when he had TIAs. Pt spouse also reports of memory deficits. Communication Communication: No difficulties Dominant Hand: Right    Cognition  Cognition Arousal/Alertness: Awake/alert Behavior During Therapy: Flat affect Overall Cognitive Status: History of cognitive impairments - at baseline Area of Impairment: Orientation;Attention Orientation Level:  (pt mildly confused regarding place -thought he was at [facility] ) Memory: Decreased short-term memory    Extremity/Trunk Assessment Upper Extremity Assessment Upper Extremity Assessment: Overall WFL for tasks  assessed Lower Extremity Assessment Lower Extremity Assessment: Overall WFL for tasks assessed Cervical / Trunk Assessment Cervical /  Trunk Assessment: Kyphotic   Balance Balance Overall balance assessment: Needs assistance High level balance activites: Backward walking;Turns (tandem ambulation) High Level Balance Comments: pt with LOB during tandem amb and turning requiring minA to maintain balance.   End of Session PT - End of Session Equipment Utilized During Treatment: Gait belt Activity Tolerance: Patient tolerated treatment well Patient left: in bed;with call bell/phone within reach;with family/visitor present Nurse Communication: Mobility status  GP Functional Assessment Tool Used: clinical judgement Functional Limitation: Mobility: Walking and moving around Mobility: Walking and Moving Around Current Status ([id number]): At least 20 percent but less than 40 percent impaired, limited or restricted Mobility: Walking and Moving Around Goal Status [phone]): At least 1 percent but less than 20 percent impaired, limited or restricted   Kingsley Callander 11/12/2013, 11:43 AM  Kittie Plater, PT, DPT Pager #: [phone] Office #: [phone]

## 2013-11-13 DIAGNOSIS — S06330A Contusion and laceration of cerebrum, unspecified, without loss of consciousness, initial encounter: Secondary | ICD-10-CM | POA: Diagnosis not present

## 2013-11-13 LAB — GLUCOSE, CAPILLARY: Glucose-Capillary: 93 mg/dL (ref 70–99)

## 2013-11-13 MED ORDER — PREDNISONE 10 MG PO TABS: 10.0000 mg | ORAL_TABLET | Freq: Every day | ORAL | Status: DC

## 2013-11-13 MED ORDER — CLOPIDOGREL BISULFATE 75 MG PO TABS: 75.0000 mg | ORAL_TABLET | Freq: Every day | ORAL | Status: DC

## 2013-11-13 MED ORDER — METOPROLOL SUCCINATE ER 25 MG PO TB24: 25.0000 mg | ORAL_TABLET | Freq: Every day | ORAL | Status: DC

## 2013-11-13 NOTE — Care Management Note (Signed)
    Page 1 of 1   11/13/2013     2:08:08 PM   CARE MANAGEMENT NOTE 11/13/2013  Patient:  Edward Oconnell, Edward Oconnell   Account Number:  [id number]  Date Initiated:  11/13/2013  Documentation initiated by:  Marvetta Gibbons  Subjective/Objective Assessment:   Pt admitted with [facility]     Action/Plan:   PTA pt lived at home with spouse- PT recommending outpt PT- will need order or script for outpt therapy for balance   Anticipated DC Date:  11/13/2013   Anticipated DC Plan:  [place]  CM consult  Outpatient Services - Pt will follow up      Choice offered to / List presented to:             Status of service:  Completed, signed off Medicare Important Message given?   (If response is "NO", the following Medicare IM given date fields will be blank) Date Medicare IM given:   Date Additional Medicare IM given:    Discharge Disposition:  HOME/SELF CARE  Per UR Regulation:  Reviewed for med. necessity/level of care/duration of stay  If discussed at [place] of Stay Meetings, dates discussed:    Comments:  11/14/13- 49- Marvetta Gibbons RN, BSN [phone] PT recommending outpt PT- in to speak with pt and wife at bedside- discussed PT recommendations and options of [facility] outpt Neuro rehab referral vs pt seeking outpt PT closer to home which is in [place]- per wife they have done outpt PT and know of 3 different locations closer to home that they could use- pt has decided that he would like to f/u on his own with outpt PT and does not wish to have a referral to Neuro rehab. Will ask MD to give pt a prescription for outpt PT for balance training so that pt and wife and go the an outpt location closer to home.

## 2013-11-13 NOTE — Discharge Summary (Signed)
DISCHARGE SUMMARY  Edward Oconnell  MR#: [id number]  DOB:1945-10-15  Date of Admission: 11/11/2013 Date of Discharge: 11/13/2013  Attending Physician:Lititia Sen T  Patient's XQJ:JHERDE Damita Dunnings, MD  Consults: Neurosurgery   Disposition: D/C home w/ wife   Follow-up Appts:     Follow-up Information   Follow up with Elsie Stain, MD. Schedule an appointment as soon as possible for a visit in 1 week.   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 He developed a wound on the right frontal area and a MRI revealed a cerebral contusion and trace SAH.   Hospital Course:  Cerebral contusion / SAH  - NS evaluated - repeat CT 3/3 revealed stable acute shear injury in the left cingulate gyrus and bifrontal non-hemorraghic contusions - neurologically stable - offered pt and wife transfer to neuro floor for further PT/OT and observation, v/s d/c home - they both prefer d/c home - pt to continue outpt PT/OT   Supratherapeutic INR  -  coumadin intitated by Cards on 10/30/2013 - INR 5.1 at presentation - Vit K given but INR remained elevated on 3/3 - as bleed had stabilized did not give further Vit K or FFP  Atrial fibrillation  - sinus/sinus bradycardia th/o admit - avoiding anticoagulants for now - CHADS-VASc score high at 5, so reconsideration to anticoag in future may not be unreasonable, depending upon gait testing/stability in f/u - amio was previously stopped (being used for vent arrythmia) due to bradycardia   CAD  anterior STEMI 0/26/37 complicated by ventricular fibrillation and cardiogenic shock - DES to LAD - Brilinta changed to Plavix on 2/18, w/ d/c of asa and addition of coumadin - will resume ASA and Plavix 10 days post d/c if remains clinically stable   HYPOTHYROIDISM  - stable   DIABETES MELLITUS, TYPE II w/ renal complications  - stable   HYPERTENSION  - no changes made to BP regimen   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     clopidogrel 75 MG tablet  Commonly known as:  PLAVIX  Take 1 tablet (75 mg total) by mouth daily.  Start taking on:  11/23/2013  hydrALAZINE 25 MG tablet  Commonly known as:  APRESOLINE  Take 25 mg by mouth daily.     metoprolol succinate 25 MG 24 hr tablet  Commonly known as:  TOPROL-XL  Take 1 tablet (25 mg total) by mouth daily.     nitroGLYCERIN 0.4 MG SL tablet  Commonly known as:  NITROSTAT  Place 1 tablet (0.4 mg total) under the tongue every 5 (five) minutes x 3 doses as needed for chest pain.     ondansetron 4 MG tablet  Commonly known as:  ZOFRAN  Take 4 mg by mouth every 8 (eight) hours as needed for nausea or vomiting.     predniSONE 10 MG tablet  Commonly known as:   DELTASONE  Take 1 tablet (10 mg total) by mouth daily with breakfast.     sertraline 100 MG tablet  Commonly known as:  ZOLOFT  Take 100 mg by mouth daily.     SLOW-MAG PO  Take 1 tablet by mouth 2 (two) times daily.     sodium bicarbonate 650 MG tablet  Take 650 mg by mouth 2 (two) times daily.     SYSTANE BALANCE OP  Place 1 drop into both eyes 4 (four) times daily.     Valerian 450 MG Caps  Take by mouth. Take 1 tab every other day       Day of Discharge BP 137/86  Pulse 76  Temp(Src) 98.1 F (36.7 C) (Oral)  Resp 18  Ht 5\' 9"  (1.753 m)  Wt 65.2 kg (143 lb 11.8 oz)  BMI 21.22 kg/m2  SpO2 96%  Physical Exam: General: No acute respiratory distress Lungs: Clear to auscultation bilaterally without wheezes or crackles Cardiovascular: Regular rate and rhythm without murmur gallop or rub normal S1 and S2 Abdomen: Nontender, nondistended, soft, bowel sounds positive, no rebound, no ascites, no appreciable mass Extremities: No significant cyanosis, clubbing, or edema bilateral lower extremities Neuro:  Alert and oriented x3 - CN II-XII intact B - 4+/5 strength th/o B U and LE   Time spent in discharge (includes decision making & examination of pt): >30 minutes  11/13/2013, 12:08 PM   Cherene Altes, MD [facility] Office  [phone] Pager [phone]  On-Call/Text Page:      [email or web address]      password [facility]

## 2013-11-13 NOTE — Progress Notes (Signed)
Pt dishcarging to home with spouse, d/c instructions given with PT/OT script - spouse verbalizes understanding

## 2013-11-13 NOTE — Discharge Instructions (Signed)
Subarachnoid Hemorrhage Subarachnoid hemorrhage is bleeding in the area between the brain and the membrane that covers the brain (subarachnoid space). This increases the pressure on the brain and causes some areas of the brain to be deprived of blood flow. Subarachnoid hemorrhage is a medical emergency that may cause permanent brain damage, stroke, or even death if not treated.  CAUSES   Head injury.   Ruptured brain aneurysm.   Bleeding from blood vessels that develop abnormally (arteriovenous malformation).   Bleeding disorder.   Use of blood thinners (anticoagulants).  Use of certain drugs such as cocaine. For some people with subarachnoid hemorrhage, the cause is unknown.  RISK FACTORS  Smoking.  Having high blood pressure (hypertension).  Abusing alcohol.  Being a male, especially being of post-menopausal age.  Having a family history of disease in the blood vessels of the brain (cerebrovascular disease).  Having certain genetic syndromes that result in kidney disease or connective tissue disease. SIGNS AND SYMPTOMS   A sudden, severe headache with no known cause. The headache is often described as the worst headache ever experienced.  Nausea or vomiting, especially when combined with other symptoms such as a headache.  Sudden weakness or numbness of the face, arm, or leg, especially on one side of the body.  Sudden trouble walking or difficulty moving arms or legs.  Sudden confusion.  Sudden personality changes.  Trouble speaking (aphasia) or understanding.  Difficulty swallowing.  Sudden trouble seeing in one or both eyes.  Double vision.  Dizziness.  Loss of balance or coordination.  Intolerance to light.  Stiff neck. DIAGNOSIS  Your health care provider will perform a physical exam and ask about your symptoms. If a subarachnoid hemorrhage is suspected, various tests may be ordered. These tests may include:   A CT scan.  An MRI.  A  cerebral angiogram.  A spinal tap (lumbar puncture).  Blood tests. TREATMENT  Immediate treatment in the hospital is often required to reduce the risk of brain damage. Treatment will depend on the cause of the bleeding, where it is located, and the extent of the bleeding and damage. The goals of treatment include stopping the bleeding, repairing the cause of bleeding, providing relief of symptoms, and preventing complications.   Medicines may be given to:  Lower blood pressure (antihypertensives).  Relieve pain (analgesics).  Relieve nausea or vomiting.  Surgery may also be needed to stop the bleeding, repair the cause of the bleeding, or remove the blood.  Rehabilitation may be needed to improve any cognitive and day-to-day functions impaired by the condition. Further treatment depends on the duration, severity, and cause of your symptoms. Physical, speech, and occupational therapists will assess you and work to improve any functions impaired by the subarachnoid hemorrhage. Measures will be taken to prevent short-term and long-term complications, including infection from breathing foreign material into the lungs (aspiration pneumonia), blood clots in the legs, bedsores, and falls. HOME CARE INSTRUCTIONS After your hospitalization or inpatient rehabilitation is completed and you are well enough to go home, it is important to prevent a reoccurrence. Take these steps to help prevent this:  Take all your medicines exactly as instructed. Do not take any over-the-counter drugs or supplements without talking to your health care provider.  If swallow studies have determined that your swallowing reflex is present, you should eat healthy foods. A low salt (sodium), low saturated fat, low trans fat, low cholesterol diet may be recommended to manage high blood pressure. Foods may need to be  a special consistency (soft or pureed), or small bites may need to be taken in order to avoid aspirating or  choking.  Rest and limit activities or movements as directed by your health care provider.  Do not smoke. Talk to your health care provider about ways to quit smoking.  Limit alcohol use. Moderate alcohol use is considered to be:  No more than 2 drinks each day for men.  No more than 1 drink each day for nonpregnant women.  Make any other lifestyle changes as directed by your health care provider.  Monitor and record your blood pressure as directed by your health care provider.  A safe home environment is important to reduce the risk of falls. Your health care provider may arrange for specialists to evaluate your home. Having grab bars in the bedroom and bathroom is often important. Your health care provider may arrange for special equipment to be used at home, such as raised toilets and a seat for the shower.  Physical, occupational, and speech therapy. Ongoing therapy may be needed to maximize your recovery after a subarachnoid bleed. If you have been advised to use a walker or a cane, use it at all times. Be sure to keep your therapy appointments.  Keep all follow-up appointments with your health care provider and other specialists. This includes any referrals, physical therapy, and rehabilitation. SEEK IMMEDIATE MEDICAL CARE IF:   You suddenly have a sudden, severe headache with no known cause.  You have nausea or vomiting occurring with another symptom.  You have sudden weakness or numbness of the face, arm, or leg, especially on one side of the body.  You have sudden trouble walking or difficulty moving arms or legs.  You have sudden confusion.  You have trouble speaking (aphasia) or understanding.  You have sudden trouble seeing in one or both eyes.  You have a sudden loss of balance or coordination.  You have a stiff neck.  You have difficulty breathing.  You have a partial or total loss of consciousness. Any of these symptoms may represent a serious problem that  is an emergency. Do not wait to see if the symptoms will go away. Get medical help right away. Call your local emergency services (911 in [place] Do not drive yourself to the hospital. MAKE SURE YOU:   Understand these instructions.  Will watch your condition.  Will get help right away if you are not doing well or get worse. Document Released: 07/16/2004 Document Revised: 06/19/2013 Document Reviewed: 10/12/2012 [facility] Patient Information 2014 [place], [place].

## 2013-11-14 ENCOUNTER — Telehealth: Payer: Self-pay | Admitting: *Deleted

## 2013-11-14 NOTE — Telephone Encounter (Signed)
Dr. Thereasa Solo ordered PT as an outpatient but patient requests [facility] PT and would like for you to order it please.  [place] can provide this service.  Order should read:  Home Health PT for balance training.  Dx:  Cerebral concussion.

## 2013-11-14 NOTE — Telephone Encounter (Signed)
Please give order for Home Health PT for balance training. Dx: Cerebral concussion. Thanks. Let me know if you need a referral.

## 2013-11-15 NOTE — Telephone Encounter (Signed)
Anderson Malta at [facility] advised.

## 2013-11-22 ENCOUNTER — Ambulatory Visit (INDEPENDENT_AMBULATORY_CARE_PROVIDER_SITE_OTHER): Payer: Medicare Other | Admitting: Family Medicine

## 2013-11-22 ENCOUNTER — Encounter: Payer: Self-pay | Admitting: Family Medicine

## 2013-11-22 VITALS — BP 146/88 | HR 70 | Temp 98.1°F | Wt 149.2 lb

## 2013-11-22 DIAGNOSIS — S06339A Contusion and laceration of cerebrum, unspecified, with loss of consciousness of unspecified duration, initial encounter: Secondary | ICD-10-CM

## 2013-11-22 DIAGNOSIS — I2510 Atherosclerotic heart disease of native coronary artery without angina pectoris: Secondary | ICD-10-CM

## 2013-11-22 DIAGNOSIS — S06330A Contusion and laceration of cerebrum, unspecified, without loss of consciousness, initial encounter: Secondary | ICD-10-CM | POA: Diagnosis not present

## 2013-11-22 DIAGNOSIS — S0633AA Contusion and laceration of cerebrum, unspecified, with loss of consciousness status unknown, initial encounter: Secondary | ICD-10-CM

## 2013-11-22 DIAGNOSIS — I4891 Unspecified atrial fibrillation: Secondary | ICD-10-CM | POA: Diagnosis not present

## 2013-11-22 NOTE — Progress Notes (Signed)
Pre visit review using our clinic review tool, if applicable. No additional management support is needed unless otherwise documented below in the visit note.  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  Here for f/u today.  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    Wife has noted inc in confusion, more in the last few weeks, again increased after the last hospitalization.  He has been compliant with meds- anticoagulation held in meantime. No new focal neuro sx.  His memory by his admission is worse.  Entirety of hospital course discussed.  He has f/u with [facility] on Monday for EEG at neuro clinic.    PMH and SH reviewed  ROS: See HPI, otherwise noncontributory.  Meds, vitals, and allergies reviewed.   nad Surgical changes noted on R cheek A&O but 1/3 on recall.  Can still read a watch and do basic math.  CN 2-12 wnl B, S/S wnl. Intention tremor noted L>R hand.  Mmm Neck supple, no LA rrr cta abd soft, not ttp Ext w/o edema

## 2013-11-22 NOTE — Patient Instructions (Signed)
Continue with the plan from the hospital and I'll await the EEG and the neuro notes.  Take care.  Glad to see you.

## 2013-11-23 NOTE — Assessment & Plan Note (Signed)
He sounds to be RRR today.  He has been taking BB BID, would continue as is.  Coumadin held for now.

## 2013-11-23 NOTE — Assessment & Plan Note (Signed)
He will restart ASA and PPI tomorrow.  He has noted intention tremor and memory changes.  I don't know how much is due to recent illness and may be reversible vs how much is really his new baseline. I will await neuro notes and EEG. >25 minutes spent in face to face time with patient, >50% spent in counselling or coordination of care.

## 2013-11-26 ENCOUNTER — Telehealth: Payer: Self-pay | Admitting: Family Medicine

## 2013-11-26 DIAGNOSIS — I639 Cerebral infarction, unspecified: Secondary | ICD-10-CM

## 2013-11-26 DIAGNOSIS — R410 Disorientation, unspecified: Secondary | ICD-10-CM

## 2013-11-26 NOTE — Telephone Encounter (Signed)
I can't order the EEG.  I can get him referred to another neuro clinic in the meantime, either in [place] or [place].  Let me know what they prefer.

## 2013-11-26 NOTE — Telephone Encounter (Signed)
Wife advised.  They would like to try to get a Neuro appt in [place].

## 2013-11-26 NOTE — Telephone Encounter (Signed)
Pt's wife called to request order for EEG to be done at [facility].  She said they went to [facility] last week but were unable to have it done there because of time issues and she doesn't want to drive back there.

## 2013-11-27 NOTE — Telephone Encounter (Signed)
Patient's wife notified that referral is in and she will hear back from the referral coordinator.

## 2013-11-27 NOTE — Telephone Encounter (Signed)
Referral is in. Thanks.

## 2013-11-28 DIAGNOSIS — R1110 Vomiting, unspecified: Secondary | ICD-10-CM | POA: Diagnosis not present

## 2013-12-09 DIAGNOSIS — R413 Other amnesia: Secondary | ICD-10-CM | POA: Diagnosis not present

## 2013-12-09 DIAGNOSIS — R569 Unspecified convulsions: Secondary | ICD-10-CM | POA: Diagnosis not present

## 2013-12-09 DIAGNOSIS — F29 Unspecified psychosis not due to a substance or known physiological condition: Secondary | ICD-10-CM | POA: Diagnosis not present

## 2013-12-14 ENCOUNTER — Other Ambulatory Visit (HOSPITAL_COMMUNITY): Payer: Self-pay | Admitting: Physician Assistant

## 2013-12-16 ENCOUNTER — Other Ambulatory Visit (HOSPITAL_COMMUNITY): Payer: Self-pay | Admitting: Family Medicine

## 2013-12-16 DIAGNOSIS — I129 Hypertensive chronic kidney disease with stage 1 through stage 4 chronic kidney disease, or unspecified chronic kidney disease: Secondary | ICD-10-CM | POA: Diagnosis present

## 2013-12-16 DIAGNOSIS — R269 Unspecified abnormalities of gait and mobility: Secondary | ICD-10-CM | POA: Diagnosis not present

## 2013-12-16 DIAGNOSIS — R32 Unspecified urinary incontinence: Secondary | ICD-10-CM | POA: Diagnosis present

## 2013-12-16 DIAGNOSIS — G0491 Myelitis, unspecified: Secondary | ICD-10-CM | POA: Diagnosis not present

## 2013-12-16 DIAGNOSIS — M25529 Pain in unspecified elbow: Secondary | ICD-10-CM | POA: Diagnosis not present

## 2013-12-16 DIAGNOSIS — R413 Other amnesia: Secondary | ICD-10-CM | POA: Diagnosis not present

## 2013-12-16 DIAGNOSIS — R839 Unspecified abnormal finding in cerebrospinal fluid: Secondary | ICD-10-CM | POA: Diagnosis not present

## 2013-12-16 DIAGNOSIS — N189 Chronic kidney disease, unspecified: Secondary | ICD-10-CM | POA: Diagnosis present

## 2013-12-16 DIAGNOSIS — Z7902 Long term (current) use of antithrombotics/antiplatelets: Secondary | ICD-10-CM | POA: Diagnosis not present

## 2013-12-16 DIAGNOSIS — G053 Encephalitis and encephalomyelitis in diseases classified elsewhere: Secondary | ICD-10-CM | POA: Diagnosis not present

## 2013-12-16 DIAGNOSIS — E871 Hypo-osmolality and hyponatremia: Secondary | ICD-10-CM | POA: Diagnosis not present

## 2013-12-16 DIAGNOSIS — G911 Obstructive hydrocephalus: Secondary | ICD-10-CM | POA: Diagnosis not present

## 2013-12-16 DIAGNOSIS — I10 Essential (primary) hypertension: Secondary | ICD-10-CM | POA: Diagnosis not present

## 2013-12-16 DIAGNOSIS — G9389 Other specified disorders of brain: Secondary | ICD-10-CM | POA: Diagnosis not present

## 2013-12-16 DIAGNOSIS — E039 Hypothyroidism, unspecified: Secondary | ICD-10-CM | POA: Diagnosis not present

## 2013-12-16 DIAGNOSIS — W19XXXA Unspecified fall, initial encounter: Secondary | ICD-10-CM | POA: Diagnosis not present

## 2013-12-16 DIAGNOSIS — A5217 General paresis: Secondary | ICD-10-CM | POA: Diagnosis not present

## 2013-12-16 DIAGNOSIS — I2510 Atherosclerotic heart disease of native coronary artery without angina pectoris: Secondary | ICD-10-CM | POA: Diagnosis not present

## 2013-12-16 DIAGNOSIS — B998 Other infectious disease: Secondary | ICD-10-CM | POA: Diagnosis not present

## 2013-12-16 DIAGNOSIS — B459 Cryptococcosis, unspecified: Secondary | ICD-10-CM | POA: Diagnosis not present

## 2013-12-16 DIAGNOSIS — B2790 Infectious mononucleosis, unspecified without complication: Secondary | ICD-10-CM | POA: Diagnosis not present

## 2013-12-16 DIAGNOSIS — G031 Chronic meningitis: Secondary | ICD-10-CM | POA: Diagnosis not present

## 2013-12-16 DIAGNOSIS — G0490 Encephalitis and encephalomyelitis, unspecified: Secondary | ICD-10-CM | POA: Diagnosis not present

## 2013-12-16 DIAGNOSIS — B451 Cerebral cryptococcosis: Secondary | ICD-10-CM | POA: Diagnosis not present

## 2013-12-16 DIAGNOSIS — Q612 Polycystic kidney, adult type: Secondary | ICD-10-CM | POA: Diagnosis not present

## 2013-12-16 DIAGNOSIS — N390 Urinary tract infection, site not specified: Secondary | ICD-10-CM | POA: Diagnosis not present

## 2013-12-16 DIAGNOSIS — E876 Hypokalemia: Secondary | ICD-10-CM | POA: Diagnosis not present

## 2013-12-16 DIAGNOSIS — I4949 Other premature depolarization: Secondary | ICD-10-CM | POA: Diagnosis not present

## 2013-12-16 DIAGNOSIS — I4891 Unspecified atrial fibrillation: Secondary | ICD-10-CM | POA: Diagnosis not present

## 2013-12-16 DIAGNOSIS — Z8673 Personal history of transient ischemic attack (TIA), and cerebral infarction without residual deficits: Secondary | ICD-10-CM | POA: Diagnosis not present

## 2013-12-16 DIAGNOSIS — E785 Hyperlipidemia, unspecified: Secondary | ICD-10-CM | POA: Diagnosis present

## 2013-12-16 DIAGNOSIS — R4182 Altered mental status, unspecified: Secondary | ICD-10-CM | POA: Diagnosis not present

## 2013-12-16 DIAGNOSIS — S0990XA Unspecified injury of head, initial encounter: Secondary | ICD-10-CM | POA: Diagnosis not present

## 2013-12-16 DIAGNOSIS — Z940 Kidney transplant status: Secondary | ICD-10-CM | POA: Diagnosis not present

## 2013-12-16 DIAGNOSIS — R159 Full incontinence of feces: Secondary | ICD-10-CM | POA: Diagnosis present

## 2013-12-16 DIAGNOSIS — D72810 Lymphocytopenia: Secondary | ICD-10-CM | POA: Diagnosis not present

## 2013-12-16 DIAGNOSIS — G02 Meningitis in other infectious and parasitic diseases classified elsewhere: Secondary | ICD-10-CM | POA: Diagnosis present

## 2013-12-16 DIAGNOSIS — Q619 Cystic kidney disease, unspecified: Secondary | ICD-10-CM | POA: Diagnosis not present

## 2013-12-16 DIAGNOSIS — I498 Other specified cardiac arrhythmias: Secondary | ICD-10-CM | POA: Diagnosis not present

## 2013-12-16 DIAGNOSIS — F29 Unspecified psychosis not due to a substance or known physiological condition: Secondary | ICD-10-CM | POA: Diagnosis not present

## 2014-01-03 DIAGNOSIS — Z85828 Personal history of other malignant neoplasm of skin: Secondary | ICD-10-CM | POA: Diagnosis not present

## 2014-01-03 DIAGNOSIS — Z940 Kidney transplant status: Secondary | ICD-10-CM | POA: Diagnosis not present

## 2014-01-03 DIAGNOSIS — Z9181 History of falling: Secondary | ICD-10-CM | POA: Diagnosis not present

## 2014-01-03 DIAGNOSIS — I4891 Unspecified atrial fibrillation: Secondary | ICD-10-CM | POA: Diagnosis not present

## 2014-01-03 DIAGNOSIS — G02 Meningitis in other infectious and parasitic diseases classified elsewhere: Secondary | ICD-10-CM | POA: Diagnosis not present

## 2014-01-03 DIAGNOSIS — B451 Cerebral cryptococcosis: Secondary | ICD-10-CM | POA: Diagnosis not present

## 2014-01-03 DIAGNOSIS — I2510 Atherosclerotic heart disease of native coronary artery without angina pectoris: Secondary | ICD-10-CM | POA: Diagnosis not present

## 2014-01-03 DIAGNOSIS — B459 Cryptococcosis, unspecified: Secondary | ICD-10-CM | POA: Diagnosis not present

## 2014-01-03 DIAGNOSIS — I10 Essential (primary) hypertension: Secondary | ICD-10-CM | POA: Diagnosis not present

## 2014-01-03 DIAGNOSIS — IMO0002 Reserved for concepts with insufficient information to code with codable children: Secondary | ICD-10-CM | POA: Diagnosis not present

## 2014-01-03 DIAGNOSIS — Q613 Polycystic kidney, unspecified: Secondary | ICD-10-CM | POA: Diagnosis not present

## 2014-01-06 DIAGNOSIS — B451 Cerebral cryptococcosis: Secondary | ICD-10-CM | POA: Diagnosis not present

## 2014-01-06 DIAGNOSIS — B459 Cryptococcosis, unspecified: Secondary | ICD-10-CM | POA: Diagnosis not present

## 2014-01-06 DIAGNOSIS — I4891 Unspecified atrial fibrillation: Secondary | ICD-10-CM | POA: Diagnosis not present

## 2014-01-06 DIAGNOSIS — G02 Meningitis in other infectious and parasitic diseases classified elsewhere: Secondary | ICD-10-CM | POA: Diagnosis not present

## 2014-01-06 DIAGNOSIS — Q613 Polycystic kidney, unspecified: Secondary | ICD-10-CM | POA: Diagnosis not present

## 2014-01-06 DIAGNOSIS — I2510 Atherosclerotic heart disease of native coronary artery without angina pectoris: Secondary | ICD-10-CM | POA: Diagnosis not present

## 2014-01-06 DIAGNOSIS — I10 Essential (primary) hypertension: Secondary | ICD-10-CM | POA: Diagnosis not present

## 2014-01-08 DIAGNOSIS — Q613 Polycystic kidney, unspecified: Secondary | ICD-10-CM | POA: Diagnosis not present

## 2014-01-08 DIAGNOSIS — B459 Cryptococcosis, unspecified: Secondary | ICD-10-CM | POA: Diagnosis not present

## 2014-01-08 DIAGNOSIS — I10 Essential (primary) hypertension: Secondary | ICD-10-CM | POA: Diagnosis not present

## 2014-01-08 DIAGNOSIS — I2510 Atherosclerotic heart disease of native coronary artery without angina pectoris: Secondary | ICD-10-CM | POA: Diagnosis not present

## 2014-01-08 DIAGNOSIS — B451 Cerebral cryptococcosis: Secondary | ICD-10-CM | POA: Diagnosis not present

## 2014-01-08 DIAGNOSIS — G02 Meningitis in other infectious and parasitic diseases classified elsewhere: Secondary | ICD-10-CM | POA: Diagnosis not present

## 2014-01-08 DIAGNOSIS — I4891 Unspecified atrial fibrillation: Secondary | ICD-10-CM | POA: Diagnosis not present

## 2014-01-09 ENCOUNTER — Ambulatory Visit (INDEPENDENT_AMBULATORY_CARE_PROVIDER_SITE_OTHER): Payer: Medicare Other | Admitting: Family Medicine

## 2014-01-09 ENCOUNTER — Encounter: Payer: Self-pay | Admitting: Family Medicine

## 2014-01-09 VITALS — BP 112/84 | HR 62 | Temp 97.9°F | Wt 147.5 lb

## 2014-01-09 DIAGNOSIS — R4182 Altered mental status, unspecified: Secondary | ICD-10-CM

## 2014-01-09 DIAGNOSIS — I2510 Atherosclerotic heart disease of native coronary artery without angina pectoris: Secondary | ICD-10-CM

## 2014-01-09 DIAGNOSIS — I10 Essential (primary) hypertension: Secondary | ICD-10-CM | POA: Diagnosis not present

## 2014-01-09 LAB — BASIC METABOLIC PANEL
BUN: 38 mg/dL — ABNORMAL HIGH (ref 6–23)
CALCIUM: 9.4 mg/dL (ref 8.4–10.5)
CO2: 28 meq/L (ref 19–32)
Chloride: 95 mEq/L — ABNORMAL LOW (ref 96–112)
Creatinine, Ser: 2.4 mg/dL — ABNORMAL HIGH (ref 0.4–1.5)
GFR: 29.05 mL/min — ABNORMAL LOW (ref >60.00)
Glucose, Bld: 92 mg/dL (ref 70–99)
Potassium: 4.8 mEq/L (ref 3.5–5.1)
Sodium: 132 mEq/L — ABNORMAL LOW (ref 135–145)

## 2014-01-09 NOTE — Progress Notes (Signed)
Pre visit review using our clinic review tool, if applicable. No additional management support is needed unless otherwise documented below in the visit note.  Hospital f/u.  Admitted at [facility] with AMS, Cr stable ~1.5-1.7 on immunosuppressives cryptococcal Ag pos on LP.  Treated with diflucan in the meantime.  No FCNAVD.  No confusion now.  A&O x3.  Still fatigued but eating well.  No focal neuro changes.  Inpatient labs and records reviewed.   D/w pt about plavix and diflucan but at this point the benefit very likely outweighs the risks.   PMH and SH reviewed  ROS: See HPI, otherwise noncontributory.  Meds, vitals, and allergies reviewed.   Nad, A&O x3 ncat except for chronic R maxillary changes MMM, OP wnl Neck supple, no LA rrr ctab abd soft  Ext w/o edema CN 2-12 wnl B, S/S/DTR wnl x4 Gait wnl.

## 2014-01-09 NOTE — Patient Instructions (Signed)
Don't change your meds for now.  Go to the lab on the way out.  We'll contact you with your lab report. Take care.

## 2014-01-09 NOTE — Assessment & Plan Note (Signed)
Admitted at [facility] with [place] 2015, Cr stable ~1.5-1.7 on immunosuppressives cryptococcal Ag pos on LP. Treated with diflucan in the meantime. No FCNAVD. No confusion now. A&O x3. Still fatigued but eating well. No focal neuro changes. Inpatient labs and records reviewed.   Still on diflucan and noted inc in Cr.  Will notify renal and await recs. See notes on labs.  >25 minutes spent in face to face time with patient, >50% spent in counselling or coordination of care.

## 2014-01-10 ENCOUNTER — Telehealth: Payer: Self-pay | Admitting: Family Medicine

## 2014-01-10 DIAGNOSIS — I10 Essential (primary) hypertension: Secondary | ICD-10-CM | POA: Diagnosis not present

## 2014-01-10 DIAGNOSIS — B459 Cryptococcosis, unspecified: Secondary | ICD-10-CM | POA: Diagnosis not present

## 2014-01-10 DIAGNOSIS — I4891 Unspecified atrial fibrillation: Secondary | ICD-10-CM | POA: Diagnosis not present

## 2014-01-10 DIAGNOSIS — Q613 Polycystic kidney, unspecified: Secondary | ICD-10-CM | POA: Diagnosis not present

## 2014-01-10 DIAGNOSIS — B451 Cerebral cryptococcosis: Secondary | ICD-10-CM | POA: Diagnosis not present

## 2014-01-10 DIAGNOSIS — I2510 Atherosclerotic heart disease of native coronary artery without angina pectoris: Secondary | ICD-10-CM | POA: Diagnosis not present

## 2014-01-10 NOTE — Telephone Encounter (Signed)
emmi mailed to patient

## 2014-01-13 DIAGNOSIS — G02 Meningitis in other infectious and parasitic diseases classified elsewhere: Secondary | ICD-10-CM

## 2014-01-13 DIAGNOSIS — B451 Cerebral cryptococcosis: Secondary | ICD-10-CM | POA: Diagnosis not present

## 2014-01-13 DIAGNOSIS — I10 Essential (primary) hypertension: Secondary | ICD-10-CM | POA: Diagnosis not present

## 2014-01-13 DIAGNOSIS — B459 Cryptococcosis, unspecified: Secondary | ICD-10-CM

## 2014-01-13 DIAGNOSIS — I4891 Unspecified atrial fibrillation: Secondary | ICD-10-CM | POA: Diagnosis not present

## 2014-01-14 DIAGNOSIS — I2510 Atherosclerotic heart disease of native coronary artery without angina pectoris: Secondary | ICD-10-CM | POA: Diagnosis not present

## 2014-01-14 DIAGNOSIS — B459 Cryptococcosis, unspecified: Secondary | ICD-10-CM | POA: Diagnosis not present

## 2014-01-14 DIAGNOSIS — I4891 Unspecified atrial fibrillation: Secondary | ICD-10-CM | POA: Diagnosis not present

## 2014-01-14 DIAGNOSIS — Q613 Polycystic kidney, unspecified: Secondary | ICD-10-CM | POA: Diagnosis not present

## 2014-01-14 DIAGNOSIS — I10 Essential (primary) hypertension: Secondary | ICD-10-CM | POA: Diagnosis not present

## 2014-01-14 DIAGNOSIS — B451 Cerebral cryptococcosis: Secondary | ICD-10-CM | POA: Diagnosis not present

## 2014-01-16 DIAGNOSIS — B451 Cerebral cryptococcosis: Secondary | ICD-10-CM | POA: Diagnosis not present

## 2014-01-16 DIAGNOSIS — G02 Meningitis in other infectious and parasitic diseases classified elsewhere: Secondary | ICD-10-CM | POA: Diagnosis not present

## 2014-01-16 DIAGNOSIS — N189 Chronic kidney disease, unspecified: Secondary | ICD-10-CM | POA: Diagnosis not present

## 2014-01-17 DIAGNOSIS — I2510 Atherosclerotic heart disease of native coronary artery without angina pectoris: Secondary | ICD-10-CM | POA: Diagnosis not present

## 2014-01-17 DIAGNOSIS — Q613 Polycystic kidney, unspecified: Secondary | ICD-10-CM | POA: Diagnosis not present

## 2014-01-17 DIAGNOSIS — B451 Cerebral cryptococcosis: Secondary | ICD-10-CM | POA: Diagnosis not present

## 2014-01-17 DIAGNOSIS — B459 Cryptococcosis, unspecified: Secondary | ICD-10-CM | POA: Diagnosis not present

## 2014-01-17 DIAGNOSIS — I4891 Unspecified atrial fibrillation: Secondary | ICD-10-CM | POA: Diagnosis not present

## 2014-01-17 DIAGNOSIS — G02 Meningitis in other infectious and parasitic diseases classified elsewhere: Secondary | ICD-10-CM | POA: Diagnosis not present

## 2014-01-17 DIAGNOSIS — I10 Essential (primary) hypertension: Secondary | ICD-10-CM | POA: Diagnosis not present

## 2014-01-20 DIAGNOSIS — I4891 Unspecified atrial fibrillation: Secondary | ICD-10-CM | POA: Diagnosis not present

## 2014-01-20 DIAGNOSIS — B451 Cerebral cryptococcosis: Secondary | ICD-10-CM | POA: Diagnosis not present

## 2014-01-20 DIAGNOSIS — I2510 Atherosclerotic heart disease of native coronary artery without angina pectoris: Secondary | ICD-10-CM | POA: Diagnosis not present

## 2014-01-20 DIAGNOSIS — Q613 Polycystic kidney, unspecified: Secondary | ICD-10-CM | POA: Diagnosis not present

## 2014-01-20 DIAGNOSIS — B459 Cryptococcosis, unspecified: Secondary | ICD-10-CM | POA: Diagnosis not present

## 2014-01-20 DIAGNOSIS — G02 Meningitis in other infectious and parasitic diseases classified elsewhere: Secondary | ICD-10-CM | POA: Diagnosis not present

## 2014-01-20 DIAGNOSIS — I10 Essential (primary) hypertension: Secondary | ICD-10-CM | POA: Diagnosis not present

## 2014-01-22 DIAGNOSIS — I2510 Atherosclerotic heart disease of native coronary artery without angina pectoris: Secondary | ICD-10-CM | POA: Diagnosis not present

## 2014-01-22 DIAGNOSIS — B451 Cerebral cryptococcosis: Secondary | ICD-10-CM | POA: Diagnosis not present

## 2014-01-22 DIAGNOSIS — I10 Essential (primary) hypertension: Secondary | ICD-10-CM | POA: Diagnosis not present

## 2014-01-22 DIAGNOSIS — Q613 Polycystic kidney, unspecified: Secondary | ICD-10-CM | POA: Diagnosis not present

## 2014-01-22 DIAGNOSIS — B459 Cryptococcosis, unspecified: Secondary | ICD-10-CM | POA: Diagnosis not present

## 2014-01-22 DIAGNOSIS — I4891 Unspecified atrial fibrillation: Secondary | ICD-10-CM | POA: Diagnosis not present

## 2014-01-27 DIAGNOSIS — Q613 Polycystic kidney, unspecified: Secondary | ICD-10-CM | POA: Diagnosis not present

## 2014-01-27 DIAGNOSIS — B459 Cryptococcosis, unspecified: Secondary | ICD-10-CM | POA: Diagnosis not present

## 2014-01-27 DIAGNOSIS — G02 Meningitis in other infectious and parasitic diseases classified elsewhere: Secondary | ICD-10-CM | POA: Diagnosis not present

## 2014-01-27 DIAGNOSIS — B451 Cerebral cryptococcosis: Secondary | ICD-10-CM | POA: Diagnosis not present

## 2014-01-27 DIAGNOSIS — I2510 Atherosclerotic heart disease of native coronary artery without angina pectoris: Secondary | ICD-10-CM | POA: Diagnosis not present

## 2014-01-27 DIAGNOSIS — I4891 Unspecified atrial fibrillation: Secondary | ICD-10-CM | POA: Diagnosis not present

## 2014-01-27 DIAGNOSIS — I10 Essential (primary) hypertension: Secondary | ICD-10-CM | POA: Diagnosis not present

## 2014-01-28 DIAGNOSIS — T8610 Unspecified complication of kidney transplant: Secondary | ICD-10-CM | POA: Diagnosis not present

## 2014-01-28 DIAGNOSIS — Q612 Polycystic kidney, adult type: Secondary | ICD-10-CM | POA: Diagnosis not present

## 2014-01-28 DIAGNOSIS — I129 Hypertensive chronic kidney disease with stage 1 through stage 4 chronic kidney disease, or unspecified chronic kidney disease: Secondary | ICD-10-CM | POA: Diagnosis not present

## 2014-01-28 DIAGNOSIS — B451 Cerebral cryptococcosis: Secondary | ICD-10-CM | POA: Diagnosis not present

## 2014-01-28 DIAGNOSIS — H169 Unspecified keratitis: Secondary | ICD-10-CM | POA: Diagnosis not present

## 2014-01-28 DIAGNOSIS — Z48298 Encounter for aftercare following other organ transplant: Secondary | ICD-10-CM | POA: Diagnosis not present

## 2014-01-28 DIAGNOSIS — N184 Chronic kidney disease, stage 4 (severe): Secondary | ICD-10-CM | POA: Diagnosis not present

## 2014-01-28 DIAGNOSIS — Z940 Kidney transplant status: Secondary | ICD-10-CM | POA: Diagnosis not present

## 2014-01-28 DIAGNOSIS — D899 Disorder involving the immune mechanism, unspecified: Secondary | ICD-10-CM | POA: Diagnosis not present

## 2014-01-28 DIAGNOSIS — G02 Meningitis in other infectious and parasitic diseases classified elsewhere: Secondary | ICD-10-CM | POA: Diagnosis not present

## 2014-01-29 ENCOUNTER — Encounter: Payer: Self-pay | Admitting: Cardiovascular Disease

## 2014-01-29 ENCOUNTER — Ambulatory Visit (INDEPENDENT_AMBULATORY_CARE_PROVIDER_SITE_OTHER): Payer: Medicare Other | Admitting: Cardiovascular Disease

## 2014-01-29 VITALS — BP 140/88 | HR 70 | Ht 68.0 in | Wt 150.0 lb

## 2014-01-29 DIAGNOSIS — I2510 Atherosclerotic heart disease of native coronary artery without angina pectoris: Secondary | ICD-10-CM

## 2014-01-29 DIAGNOSIS — I4891 Unspecified atrial fibrillation: Secondary | ICD-10-CM

## 2014-01-29 DIAGNOSIS — I10 Essential (primary) hypertension: Secondary | ICD-10-CM | POA: Diagnosis not present

## 2014-01-29 DIAGNOSIS — N184 Chronic kidney disease, stage 4 (severe): Secondary | ICD-10-CM

## 2014-01-29 NOTE — Patient Instructions (Signed)
Your physician wants you to follow-up in:  6 months. You will receive a reminder letter in the mail two months in advance. If you don't receive a letter, please call our office to schedule the follow-up appointment.   

## 2014-01-29 NOTE — Progress Notes (Signed)
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 He was admitted to [facility] 12/05/12 with anterior STEMI complicated by ventricular fibrillation and cardiogenic shock. A drug eluting eluting stent (3.0 x 38 mm Promus Premier post-dilated with a 3.5 [place] balloon) was placed in the LAD. Small non-dominant RCA was occluded at beginning of case and reperfused with medical therapy during case. An IABP was placed and he required support with pressor agents for 48 hours post cath. He was placed on amiodarone but this was stopped before discharge secondary to bradycardia. Echo 12/06/12 with preserved LV systolic function. Cath also showed a moderately severe stenosis in a moderate caliber intermediate branch which we chose to manage medically. I arranged a stress myoview after his August 2014 appt but he cancelled. He was admitted to [facility] November 2014 with N/V, diarrhea, confusion, gait instability after radiation therapy to right cheek. Negative hypercoagulable workup. Vasculitis labs negative. 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 TEE without left atrial thrombus. 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 Event monitor 09/27/13  showed atrial fibrillation. He discussed anticoagulation with the Neurology clinic at [facility], [place]) but wanted to hold off on anti-coagulation until he was seen in our office. I saw him in February 2015 and he was started on coumadin, ASA was held and Brilinta was replaced with Plavix. He came in for INR check in early March in our office and fell, hit his head and had a high INR. Coumadin was stopped and ASA restarted. Admitted to [facility] March 2015 for CNS fungal infection, treated with Diflucan.   He is here  today for follow up. He feels much better. No chest pain or SOB. Energy is improved. No awareness of irregularity of his heart rhythm.   Primary Care Physician: Elsie Stain  Nephrology: Dr. Dayle Points at [facility] ENT: Dr. Colin Benton [facility]) Neurology: [facility]) [place]  Last Lipid Profile:Lipid Panel     Component Value Date/Time   CHOL 176 11/12/2013 0234   TRIG 265* 11/12/2013 0234   HDL 40 11/12/2013 0234   CHOLHDL 4.4 11/12/2013 0234   VLDL 53* 11/12/2013 0234   LDLCALC 83 11/12/2013 0234     Past Medical History  Diagnosis Date  . CAD (coronary artery disease)   . Hypertension   . Hypothyroidism   . Benign prostatic hypertrophy     a. Nonobstructive on 2004 cath b. anterior STEMI s/p DES-pLAD 12/05/12  . Hyperlipidemia   . Diabetes mellitus without complication   . Stroke 2009  . GERD (gastroesophageal reflux disease)   . Cancer     skin cancer  . History of shingles 08/2012    Lt eye shingles  . 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 Creat was 6 when he rec'd a deceased donor transplant 2010-10-19 at [facility].  According to patient he did OK then in the first few weeks after surgery had to go back in hospital for "BP" problems that "messed the kidney up". Since then creatinine has been in 2.3-3.0 range.  Sees only [place] nephrologists, gets most care there.  Takes prograf and Myfortic, no steroids.  PKD kidneys were left in place.    . Chronic kidney disease (CKD), stage IV (  severe) 12/08/2012    Of renal transplant, done Jan 2012 at [facility]   . Cardiac arrest 12/05/2012    In the setting of anterior STEMI  . 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 Kidney transplant      09/2010  . Other surgical history  11/21/12    cancerous cells removed from mid upper chest  . Eye surgery    . Other surgical history N/A 11/15/2012    pt had skin cancer  removed from mid upper chest  . Coronary angioplasty with stent placement  12/05/2012    30% oLAD, 20% mLAD, 95-99% pLAD s/p DES, 60% oD1, 70-80% pRamus, 40% pLCx, 50% AV groove Cx, 50% PLB, 100% mRCA s/p recanalization intra-procedurally, noted to be small non-dominant with 70% pRCA stenosis post-procedure  . Cardiac catheterization  2004    Nonobstructive    Current Outpatient Prescriptions  Medication Sig Dispense Refill  . aspirin 81 MG tablet Take 81 mg by mouth daily.      Marland Kitchen atorvastatin (LIPITOR) 10 MG tablet Take 10 mg by mouth every evening.       . clopidogrel (PLAVIX) 75 MG tablet Take 1 tablet (75 mg total) by mouth daily.  30 tablet  6  . esomeprazole (NEXIUM) 40 MG capsule Take 40 mg by mouth.      . fluconazole (DIFLUCAN) 200 MG tablet Take 200 mg by mouth 4 (four) times daily.      . hydrALAZINE (APRESOLINE) 25 MG tablet Take 25 mg by mouth daily.       Marland Kitchen levothyroxine (SYNTHROID, LEVOTHROID) 100 MCG tablet Take 100 mcg by mouth daily before breakfast.      . magnesium oxide (MAG-OX) 400 MG tablet Take 400 mg by mouth.      . metoprolol tartrate (LOPRESSOR) 25 MG tablet TAKE ON HALF  TABLET (12.5  MG TOTAL) BY MOUTH 2 (TWO) TIMES DAILY.      . nitroGLYCERIN (NITROSTAT) 0.4 MG SL tablet Place 1 tablet (0.4 mg total) under the tongue every 5 (five) minutes x 3 doses as needed for chest pain.  25 tablet  3  . predniSONE (DELTASONE) 10 MG tablet Take 1 tablet (10 mg total) by mouth daily with breakfast.      . sertraline (ZOLOFT) 100 MG tablet Take 100 mg by mouth daily.       . sodium bicarbonate 650 MG tablet Take 650 mg by mouth 2 (two) times daily.        No current facility-administered medications for this visit.    No Known Allergies  History   Social History  . Marital Status: Married    Spouse Name: N/A    Number of Children: N/A  . Years of Education: N/A   Occupational History  . 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 part owner in a  [place] Topics  . Smoking status: Never Smoker   . Smokeless tobacco: Never Used  . Alcohol Use: No  . Drug Use: No  . Sexual Activity: Yes   Other Topics Concern  . Not on file   Social History Narrative   Running lawn care service, part time.     Married 1966    Family History  Problem Relation Age of Onset  . Kidney failure Mother  was on dialysis 3 times a week when she died  . Alcohol abuse Mother   . Hypertension Father   . Heart attack Sister   . Kidney disease Sister   . Aneurysm Sister   . COPD Sister   . Colon cancer Neg Hx   . Prostate cancer Neg Hx     Review of Systems:  As stated in the HPI and otherwise negative.   BP 140/88  Pulse 70  Ht 5\' 8"  (1.727 m)  Wt 150 lb (68.04 kg)  BMI 22.81 kg/m2  Physical Examination: General: Well developed, well nourished, NAD HEENT: OP clear, mucus membranes moist SKIN: warm, dry. No rashes. Neuro: No focal deficits Musculoskeletal: Muscle strength 5/5 all ext Psychiatric: Mood and affect normal Neck: No JVD, no carotid bruits, no thyromegaly, no lymphadenopathy. Lungs:Clear bilaterally, no wheezes, rhonci, crackles Cardiovascular: Regular rate and rhythm. No murmurs, gallops or rubs. Abdomen:Soft. Bowel sounds present. Non-tender.  Extremities: No lower extremity edema. Pulses are 2 + in the bilateral DP/PT.  Cardiac cath 12/05/12:  Left main: 30% ostial stenosis. 20% mid stenosis.  Left Anterior Descending Artery: Large caliber vessel that courses to the apex. The proximal vessel is hazy with diffuse 95-99% stenosis extending from the ostium down to the first diagonal branch. The remainder of the LAD has no flow limiting disease. The diagonal branch is patent ostial 60% stenosis.  Ramus Intermediate: Moderate caliber vessel with 70-80% proximal stenosis.  Circumflex Artery: Large caliber, dominant vessel with 40% proximal stenosis. The first obtuse marginal branch is small in  caliber with no obstructive disease. The second obtuse marginal branch is small with no disease. The distal AV groove Circumflex has a focal 50% stenosis. The left posterolateral branch has 50% stenosis.  Right Coronary Artery: Small caliber (1.75 mm) non-dominant vessel with 100% mid occlusion. (This vessel recanalized during the procedure and at the end of the procedure was noted to be small, non-dominant with a proximal 70% stenosis.   Echo 12/06/12:  Left ventricle: The cavity size was normal. Wall thickness was increased in a pattern of mild LVH. Systolic function was normal. The estimated ejection fraction was in the range of 60% to 65%. Wall motion was normal; there were no regional wall motion abnormalities. - Right ventricle: The cavity size was mildly dilated. Systolic function was mildly reduced.  Assessment and Plan:   1. CAD: Stable. No recent chest pain. Anterior STEMI March 2706 complicated by incessant VF and cardiogenic shock s/p DES x 1 proximal LAD. Small non-dominant RCA was occluded at beginning of case and reperfused with medical therapy during case. Moderate disease in intermediate branch which we have managed medically. Echo 12/06/12 with preserved LV systolic function. He is now on ASA and Plavix. Continue statin, beta blocker and hydralazine.   2. Chronic kidney disease, stage III s/p kidney transplant: Stable. Followed in Nephrology. Creatinine 2.4 yesterday.  3. HTN: BP controlled. No changes today.   4. Paroxysmal, atrial fibrillation: Sinus today. He is only on ASA. Coumadin was tried but we could not control his INR. He understands risk of CVA with recurrent a. Fib but given multiple complex issues, we will not start anti-coagulation.

## 2014-01-30 DIAGNOSIS — B451 Cerebral cryptococcosis: Secondary | ICD-10-CM | POA: Diagnosis not present

## 2014-01-30 DIAGNOSIS — Q613 Polycystic kidney, unspecified: Secondary | ICD-10-CM | POA: Diagnosis not present

## 2014-01-30 DIAGNOSIS — I2510 Atherosclerotic heart disease of native coronary artery without angina pectoris: Secondary | ICD-10-CM | POA: Diagnosis not present

## 2014-01-30 DIAGNOSIS — B459 Cryptococcosis, unspecified: Secondary | ICD-10-CM | POA: Diagnosis not present

## 2014-01-30 DIAGNOSIS — I10 Essential (primary) hypertension: Secondary | ICD-10-CM | POA: Diagnosis not present

## 2014-01-30 DIAGNOSIS — I4891 Unspecified atrial fibrillation: Secondary | ICD-10-CM | POA: Diagnosis not present

## 2014-02-06 DIAGNOSIS — R918 Other nonspecific abnormal finding of lung field: Secondary | ICD-10-CM | POA: Diagnosis not present

## 2014-02-06 DIAGNOSIS — J438 Other emphysema: Secondary | ICD-10-CM | POA: Diagnosis not present

## 2014-02-06 DIAGNOSIS — C3490 Malignant neoplasm of unspecified part of unspecified bronchus or lung: Secondary | ICD-10-CM | POA: Diagnosis not present

## 2014-02-13 DIAGNOSIS — R911 Solitary pulmonary nodule: Secondary | ICD-10-CM | POA: Diagnosis not present

## 2014-02-13 DIAGNOSIS — Z85828 Personal history of other malignant neoplasm of skin: Secondary | ICD-10-CM | POA: Diagnosis not present

## 2014-02-13 DIAGNOSIS — R918 Other nonspecific abnormal finding of lung field: Secondary | ICD-10-CM | POA: Diagnosis not present

## 2014-02-17 DIAGNOSIS — R4182 Altered mental status, unspecified: Secondary | ICD-10-CM | POA: Diagnosis not present

## 2014-02-18 DIAGNOSIS — D899 Disorder involving the immune mechanism, unspecified: Secondary | ICD-10-CM | POA: Diagnosis not present

## 2014-02-18 DIAGNOSIS — T8610 Unspecified complication of kidney transplant: Secondary | ICD-10-CM | POA: Diagnosis not present

## 2014-02-18 DIAGNOSIS — N183 Chronic kidney disease, stage 3 unspecified: Secondary | ICD-10-CM | POA: Diagnosis not present

## 2014-02-19 DIAGNOSIS — H16209 Unspecified keratoconjunctivitis, unspecified eye: Secondary | ICD-10-CM | POA: Diagnosis not present

## 2014-02-27 DIAGNOSIS — B459 Cryptococcosis, unspecified: Secondary | ICD-10-CM | POA: Diagnosis not present

## 2014-02-27 DIAGNOSIS — B451 Cerebral cryptococcosis: Secondary | ICD-10-CM | POA: Diagnosis not present

## 2014-02-27 DIAGNOSIS — G02 Meningitis in other infectious and parasitic diseases classified elsewhere: Secondary | ICD-10-CM | POA: Diagnosis not present

## 2014-02-28 DIAGNOSIS — B0232 Zoster iridocyclitis: Secondary | ICD-10-CM | POA: Diagnosis not present

## 2014-03-04 DIAGNOSIS — E875 Hyperkalemia: Secondary | ICD-10-CM | POA: Diagnosis not present

## 2014-03-06 ENCOUNTER — Other Ambulatory Visit: Payer: Self-pay | Admitting: Ophthalmology

## 2014-03-06 DIAGNOSIS — H16009 Unspecified corneal ulcer, unspecified eye: Secondary | ICD-10-CM | POA: Diagnosis not present

## 2014-03-07 DIAGNOSIS — C760 Malignant neoplasm of head, face and neck: Secondary | ICD-10-CM | POA: Diagnosis not present

## 2014-03-07 DIAGNOSIS — M952 Other acquired deformity of head: Secondary | ICD-10-CM | POA: Diagnosis not present

## 2014-03-07 DIAGNOSIS — H16009 Unspecified corneal ulcer, unspecified eye: Secondary | ICD-10-CM | POA: Diagnosis not present

## 2014-03-07 DIAGNOSIS — H02109 Unspecified ectropion of unspecified eye, unspecified eyelid: Secondary | ICD-10-CM | POA: Diagnosis not present

## 2014-03-08 DIAGNOSIS — H16009 Unspecified corneal ulcer, unspecified eye: Secondary | ICD-10-CM | POA: Diagnosis not present

## 2014-03-10 LAB — EYE CULTURE

## 2014-03-11 DIAGNOSIS — H02109 Unspecified ectropion of unspecified eye, unspecified eyelid: Secondary | ICD-10-CM | POA: Diagnosis not present

## 2014-03-11 DIAGNOSIS — H16009 Unspecified corneal ulcer, unspecified eye: Secondary | ICD-10-CM | POA: Diagnosis not present

## 2014-03-11 DIAGNOSIS — S058X9A Other injuries of unspecified eye and orbit, initial encounter: Secondary | ICD-10-CM | POA: Diagnosis not present

## 2014-03-13 DIAGNOSIS — H16009 Unspecified corneal ulcer, unspecified eye: Secondary | ICD-10-CM | POA: Diagnosis not present

## 2014-03-15 DIAGNOSIS — H16009 Unspecified corneal ulcer, unspecified eye: Secondary | ICD-10-CM | POA: Diagnosis not present

## 2014-03-17 ENCOUNTER — Telehealth: Payer: Self-pay | Admitting: Cardiovascular Disease

## 2014-03-17 NOTE — Telephone Encounter (Signed)
Pt's wife called to find out how many days pt needs to hold the blood thinner prior surgery. Pt is having an eye specialist appointment the end of august. Pt will probable need eye and nose surgery. Wife is aware that when  Pt see the specialist he/she will tell pt how long the surgeon requires pt to be off blood thinners before he does the surgery. Pt can call the office after seen surgeon for surgical clearance. Pt's wife verbalized understanding.

## 2014-03-17 NOTE — Telephone Encounter (Signed)
LEFT  VOICE MAIL  ON  CELL NUMBER  [phone]  CALLED   HOME NUMBER  AND  PT WAS NOT THERE  PER  WIFE    WAS  TOLD  TO  CALL MOBILE .Adonis Housekeeper

## 2014-03-17 NOTE — Telephone Encounter (Signed)
NEW PROBLEM    Pt left message in my voice mail.    Needs a call back please did not state why?

## 2014-03-17 NOTE — Telephone Encounter (Signed)
Follow up:    Pt returning our call please call him back.

## 2014-03-18 DIAGNOSIS — H16009 Unspecified corneal ulcer, unspecified eye: Secondary | ICD-10-CM | POA: Diagnosis not present

## 2014-03-20 DIAGNOSIS — H16009 Unspecified corneal ulcer, unspecified eye: Secondary | ICD-10-CM | POA: Diagnosis not present

## 2014-03-26 DIAGNOSIS — H16009 Unspecified corneal ulcer, unspecified eye: Secondary | ICD-10-CM | POA: Diagnosis not present

## 2014-03-27 LAB — CULTURE, FUNGUS WITHOUT SMEAR

## 2014-04-01 DIAGNOSIS — H16009 Unspecified corneal ulcer, unspecified eye: Secondary | ICD-10-CM | POA: Diagnosis not present

## 2014-04-07 DIAGNOSIS — B0232 Zoster iridocyclitis: Secondary | ICD-10-CM | POA: Diagnosis not present

## 2014-04-08 DIAGNOSIS — C44320 Squamous cell carcinoma of skin of unspecified parts of face: Secondary | ICD-10-CM | POA: Diagnosis not present

## 2014-04-22 DIAGNOSIS — H16009 Unspecified corneal ulcer, unspecified eye: Secondary | ICD-10-CM | POA: Diagnosis not present

## 2014-04-28 ENCOUNTER — Ambulatory Visit (INDEPENDENT_AMBULATORY_CARE_PROVIDER_SITE_OTHER): Payer: Medicare Other | Admitting: Family Medicine

## 2014-04-28 ENCOUNTER — Encounter: Payer: Self-pay | Admitting: Family Medicine

## 2014-04-28 VITALS — BP 140/80 | HR 66 | Temp 97.5°F | Wt 163.8 lb

## 2014-04-28 DIAGNOSIS — E119 Type 2 diabetes mellitus without complications: Secondary | ICD-10-CM | POA: Diagnosis not present

## 2014-04-28 DIAGNOSIS — I2510 Atherosclerotic heart disease of native coronary artery without angina pectoris: Secondary | ICD-10-CM

## 2014-04-28 LAB — HEMOGLOBIN A1C: Hgb A1c MFr Bld: 7.1 % — ABNORMAL HIGH (ref 4.6–6.5)

## 2014-04-28 NOTE — Patient Instructions (Signed)
Go to the lab on the way out.  We'll contact you with your lab report. Take care.  Glad to see you.   Schedule a physical when convenient.

## 2014-04-28 NOTE — Progress Notes (Signed)
Pre visit review using our clinic review tool, if applicable. No additional management support is needed unless otherwise documented below in the visit note.  He has facial surgery eval pending for this week.  Still with renal f/u at [facility].    HTN.  Hadn't been checking his BP at home.  No CP, SOB, BLE.  Some fatigue.  He is back to work.    Diabetes:  No meds No sugar checks at home.   Feet problems: no Due for A1c.   eye exam within last year: at [facility], also [facility].    Meds, vitals, and allergies reviewed.   ROS: See HPI.  Otherwise, noncontributory.  GEN: nad, alert and oriented HEENT: mucous membranes moist ,chronic R sided facial changes noted.  NECK: supple w/o LA CV: rrr. PULM: ctab, no inc wob ABD: soft, +bs EXT: no edema SKIN: no acute rash but bruising on arms noted.

## 2014-04-29 NOTE — Assessment & Plan Note (Signed)
See notes on labs.  A1c okay for now at 7.1.  No meds.  Wouldn't intervene on BP given his recheck BP today, 140/80.  He has fatigue at lower BP per his report.  I don't want to decrease renal perfusion, so his BP is okay for now.  LDL prev <100, checked 11/2013.

## 2014-05-01 DIAGNOSIS — H02109 Unspecified ectropion of unspecified eye, unspecified eyelid: Secondary | ICD-10-CM | POA: Diagnosis not present

## 2014-05-01 DIAGNOSIS — C760 Malignant neoplasm of head, face and neck: Secondary | ICD-10-CM | POA: Diagnosis not present

## 2014-05-08 DIAGNOSIS — N184 Chronic kidney disease, stage 4 (severe): Secondary | ICD-10-CM | POA: Diagnosis not present

## 2014-05-08 DIAGNOSIS — Z48298 Encounter for aftercare following other organ transplant: Secondary | ICD-10-CM | POA: Diagnosis not present

## 2014-05-08 DIAGNOSIS — I10 Essential (primary) hypertension: Secondary | ICD-10-CM | POA: Diagnosis not present

## 2014-05-08 DIAGNOSIS — Z9225 Personal history of immunosupression therapy: Secondary | ICD-10-CM | POA: Diagnosis not present

## 2014-05-08 DIAGNOSIS — Z992 Dependence on renal dialysis: Secondary | ICD-10-CM | POA: Diagnosis not present

## 2014-05-08 DIAGNOSIS — Z79899 Other long term (current) drug therapy: Secondary | ICD-10-CM | POA: Diagnosis not present

## 2014-05-08 DIAGNOSIS — Q612 Polycystic kidney, adult type: Secondary | ICD-10-CM | POA: Diagnosis not present

## 2014-05-08 DIAGNOSIS — IMO0002 Reserved for concepts with insufficient information to code with codable children: Secondary | ICD-10-CM | POA: Diagnosis not present

## 2014-05-08 DIAGNOSIS — T8610 Unspecified complication of kidney transplant: Secondary | ICD-10-CM | POA: Diagnosis not present

## 2014-05-08 DIAGNOSIS — N2589 Other disorders resulting from impaired renal tubular function: Secondary | ICD-10-CM | POA: Diagnosis not present

## 2014-05-08 DIAGNOSIS — R7989 Other specified abnormal findings of blood chemistry: Secondary | ICD-10-CM | POA: Diagnosis not present

## 2014-05-08 DIAGNOSIS — E785 Hyperlipidemia, unspecified: Secondary | ICD-10-CM | POA: Diagnosis not present

## 2014-05-08 DIAGNOSIS — Z940 Kidney transplant status: Secondary | ICD-10-CM | POA: Diagnosis not present

## 2014-05-09 ENCOUNTER — Telehealth: Payer: Self-pay | Admitting: Family Medicine

## 2014-05-09 NOTE — Telephone Encounter (Signed)
The [facility] nurse manager came by with a list of patients who had high Medicare claim amounts in the past 12 months due to hospital admissions.  This patient was on the list.  Would you like for [facility] to reach out to the patient?  If so, I will place a referral form in your box and you can give it to your CMA to fax to [facility].

## 2014-05-11 NOTE — Telephone Encounter (Signed)
I'm okay with that if the patient is okay with it.  Please contact patient first. Thanks.

## 2014-05-12 NOTE — Telephone Encounter (Signed)
Edward Oconnell are you going to contact patient?

## 2014-05-12 NOTE — Telephone Encounter (Signed)
I put referral form in Dr. Josefine Class box.  Dee, you don't need to contact the pt.

## 2014-05-16 DIAGNOSIS — H16009 Unspecified corneal ulcer, unspecified eye: Secondary | ICD-10-CM | POA: Diagnosis not present

## 2014-05-24 ENCOUNTER — Other Ambulatory Visit (HOSPITAL_COMMUNITY): Payer: Self-pay | Admitting: Family Medicine

## 2014-05-27 ENCOUNTER — Telehealth: Payer: Self-pay | Admitting: *Deleted

## 2014-05-27 NOTE — Telephone Encounter (Signed)
Spoke to pts wife who scheduled appt

## 2014-05-30 ENCOUNTER — Ambulatory Visit (INDEPENDENT_AMBULATORY_CARE_PROVIDER_SITE_OTHER): Payer: Medicare Other | Admitting: Family Medicine

## 2014-05-30 VITALS — BP 132/82

## 2014-05-30 DIAGNOSIS — E119 Type 2 diabetes mellitus without complications: Secondary | ICD-10-CM

## 2014-06-04 ENCOUNTER — Other Ambulatory Visit: Payer: Self-pay | Admitting: Cardiovascular Disease

## 2014-06-06 DIAGNOSIS — H16009 Unspecified corneal ulcer, unspecified eye: Secondary | ICD-10-CM | POA: Diagnosis not present

## 2014-06-11 ENCOUNTER — Telehealth: Payer: Self-pay | Admitting: Cardiovascular Disease

## 2014-06-11 NOTE — Telephone Encounter (Signed)
Received request from Nurse fax box, documents faxed for surgical clearance. To: [place] Fax number: [phone] Attention: [phone]

## 2014-06-13 DIAGNOSIS — Z01818 Encounter for other preprocedural examination: Secondary | ICD-10-CM | POA: Diagnosis not present

## 2014-06-15 IMAGING — CR DG CHEST 1V PORT
1 series · 1 of 1 positions shown · non-contrast
Comparison: 12/06/2012.

v*RADIOLOGY REPORT*
CLINICAL DATA: 66-year-old male ST elevation myocardial infarction.
Intubated.

PORTABLE CHEST - 1 VIEW

[AP]
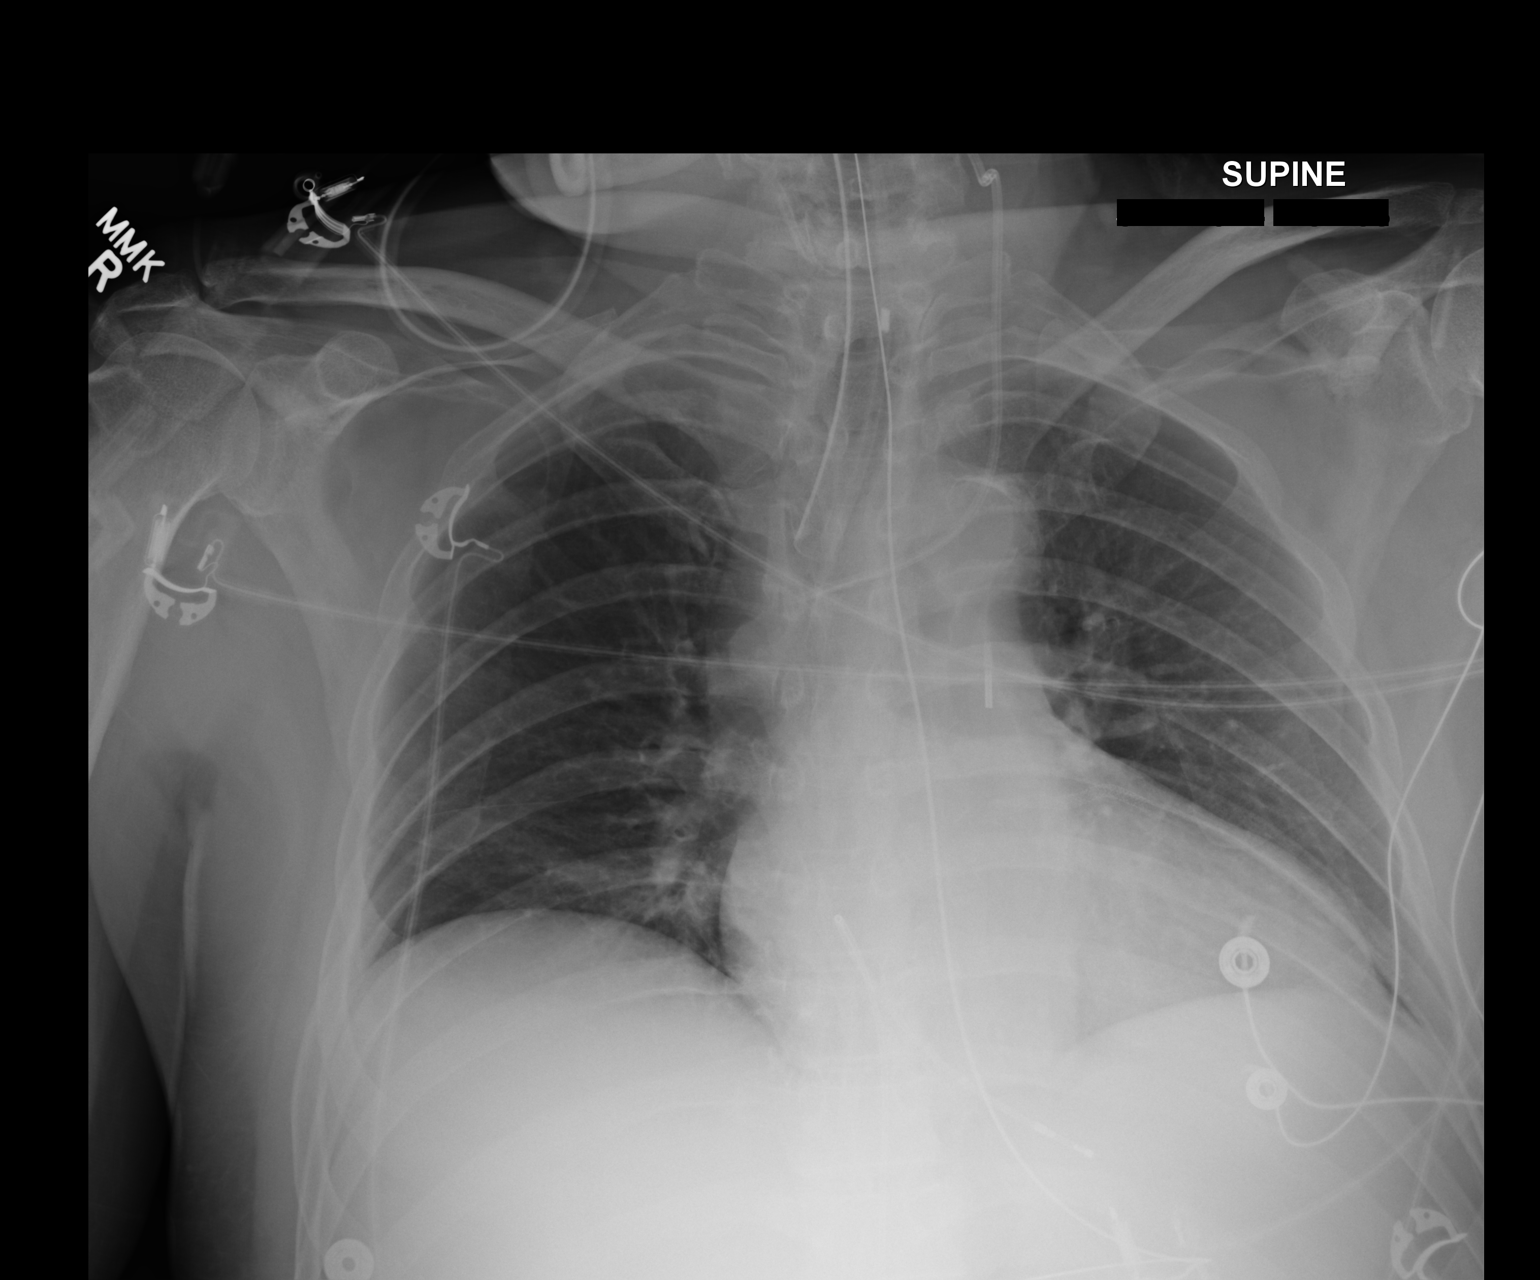

[1 of 1 positions shown; findings below may reference images not displayed]

FINDINGS: Portable supine AP view 7777 hours.  Stable endotracheal
tube tip between level of clavicles and carina.  Stable left IJ
central line except for mild kinked appearance probably at the skin
entry site.  Intra-aortic balloon pump marker remains in place.
Inferior approach cardiac pacemaker lead.  NG tube courses to the
left upper quadrant, tip not included.

Stable lung volumes.  Stable cardiac size and mediastinal contours.
No pneumothorax, pulmonary edema, pleural effusion or
consolidation.
IMPRESSION: 1. Stable lines and tubes, aside from mildly kinked of the left IJ
central line - probably at the skin entry site.
2. No acute cardiopulmonary abnormality.

## 2014-06-16 IMAGING — CR DG CHEST 1V PORT
1 series · 1 of 1 positions shown · non-contrast
Comparison: 12/06/2012

CLINICAL DATA: Myocardial infarction and ventricular fibrillation
and cardiogenic shock.

PORTABLE CHEST - 1 VIEW

[AP]
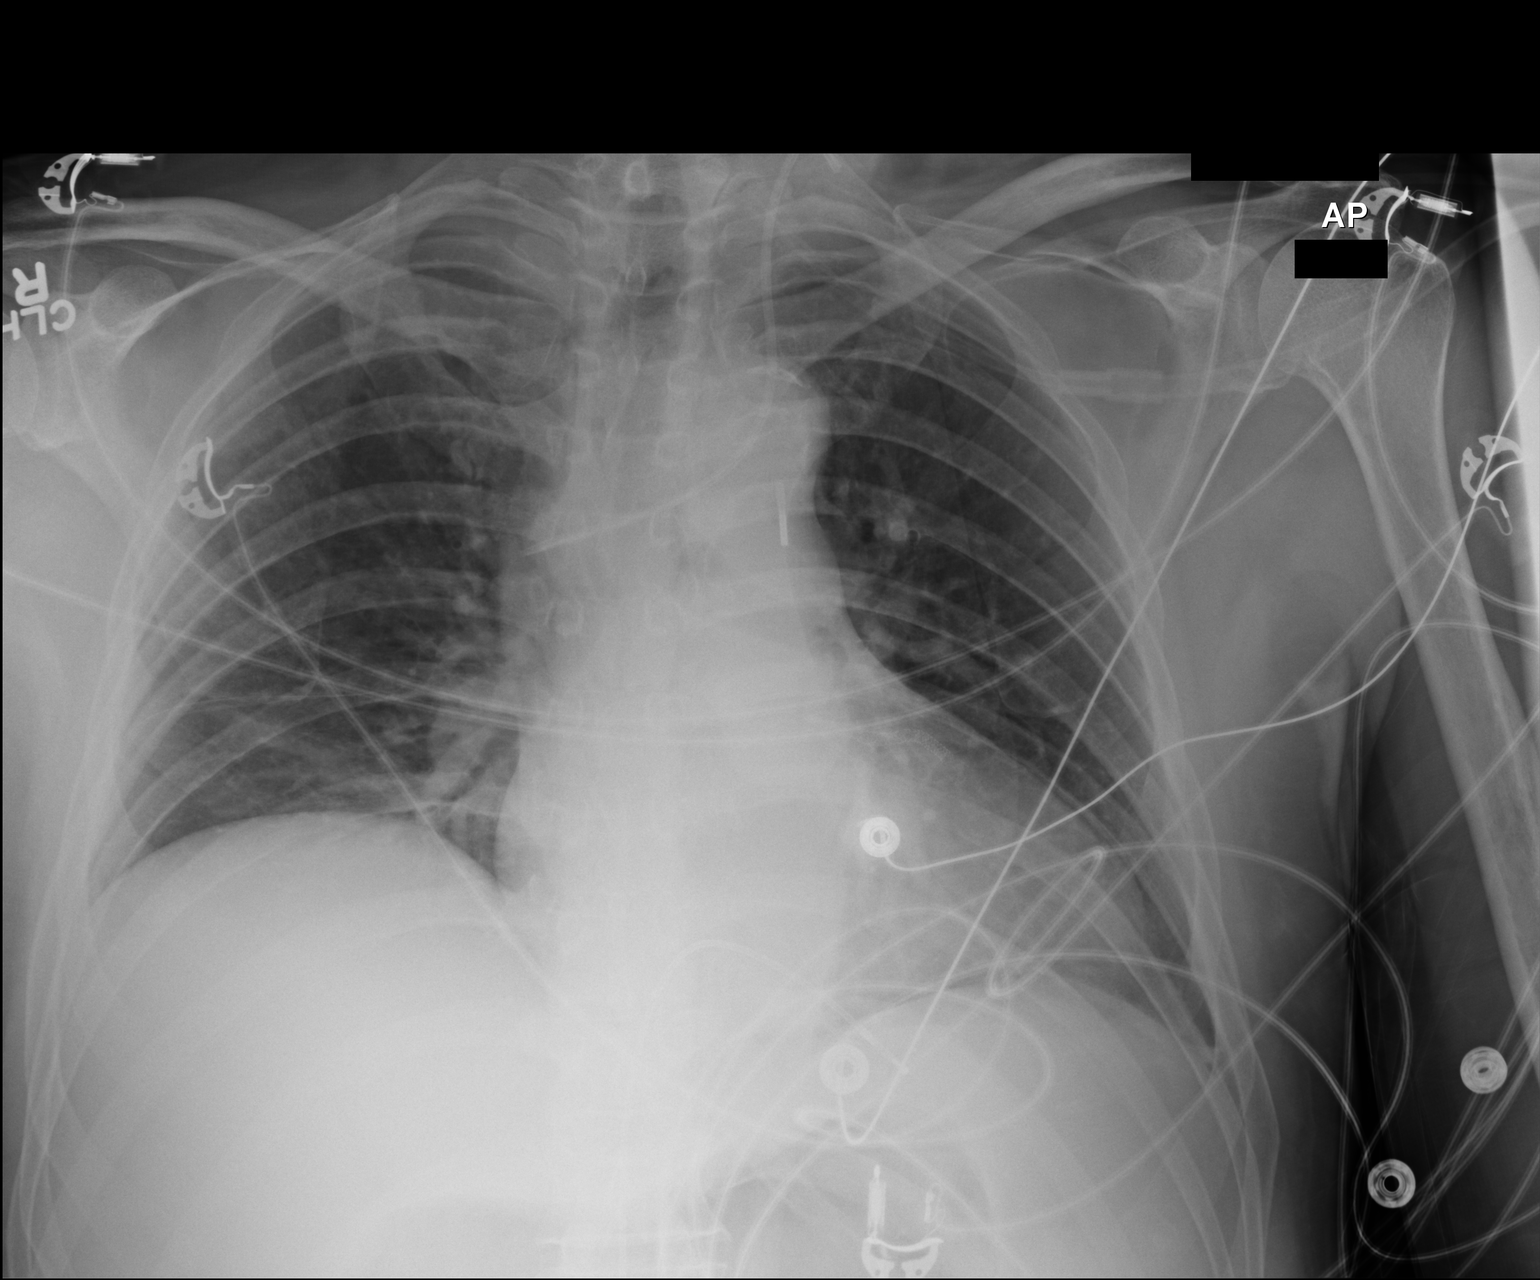

[1 of 1 positions shown; findings below may reference images not displayed]

FINDINGS: Aortic balloon pump remains in place.  Endotracheal tube
and NG tube have been removed. Central venous catheter tip is
unchanged directed toward the lateral wall of the superior vena
cava.

Heart size and vascularity are normal.  Minimal atelectasis at the
right lung base.  No effusions.
IMPRESSION: Minimal atelectasis at the right lung base.

## 2014-06-17 ENCOUNTER — Telehealth: Payer: Self-pay | Admitting: Cardiovascular Disease

## 2014-06-17 DIAGNOSIS — H16001 Unspecified corneal ulcer, right eye: Secondary | ICD-10-CM | POA: Diagnosis not present

## 2014-06-17 NOTE — Telephone Encounter (Signed)
Spoke with pt who states he received call from [facility] number and was wondering if it was from our office. I told pt I did not see call placed to him from our office.

## 2014-06-17 NOTE — Telephone Encounter (Signed)
New message     Returning someone's call

## 2014-06-20 DIAGNOSIS — Z7902 Long term (current) use of antithrombotics/antiplatelets: Secondary | ICD-10-CM | POA: Diagnosis not present

## 2014-06-20 DIAGNOSIS — H04521 Eversion of right lacrimal punctum: Secondary | ICD-10-CM | POA: Diagnosis not present

## 2014-06-20 DIAGNOSIS — I129 Hypertensive chronic kidney disease with stage 1 through stage 4 chronic kidney disease, or unspecified chronic kidney disease: Secondary | ICD-10-CM | POA: Diagnosis not present

## 2014-06-20 DIAGNOSIS — I2510 Atherosclerotic heart disease of native coronary artery without angina pectoris: Secondary | ICD-10-CM | POA: Diagnosis not present

## 2014-06-20 DIAGNOSIS — C300 Malignant neoplasm of nasal cavity: Secondary | ICD-10-CM | POA: Diagnosis not present

## 2014-06-20 DIAGNOSIS — N189 Chronic kidney disease, unspecified: Secondary | ICD-10-CM | POA: Diagnosis not present

## 2014-06-20 DIAGNOSIS — L905 Scar conditions and fibrosis of skin: Secondary | ICD-10-CM | POA: Diagnosis not present

## 2014-06-20 DIAGNOSIS — C319 Malignant neoplasm of accessory sinus, unspecified: Secondary | ICD-10-CM | POA: Diagnosis not present

## 2014-06-20 DIAGNOSIS — Z940 Kidney transplant status: Secondary | ICD-10-CM | POA: Diagnosis not present

## 2014-06-20 DIAGNOSIS — H02112 Cicatricial ectropion of right lower eyelid: Secondary | ICD-10-CM | POA: Diagnosis not present

## 2014-06-20 DIAGNOSIS — I252 Old myocardial infarction: Secondary | ICD-10-CM | POA: Diagnosis not present

## 2014-06-20 DIAGNOSIS — H02532 Eyelid retraction right lower eyelid: Secondary | ICD-10-CM | POA: Diagnosis not present

## 2014-06-20 DIAGNOSIS — Z7982 Long term (current) use of aspirin: Secondary | ICD-10-CM | POA: Diagnosis not present

## 2014-06-20 DIAGNOSIS — H02103 Unspecified ectropion of right eye, unspecified eyelid: Secondary | ICD-10-CM | POA: Diagnosis not present

## 2014-07-01 DIAGNOSIS — H16001 Unspecified corneal ulcer, right eye: Secondary | ICD-10-CM | POA: Diagnosis not present

## 2014-07-15 ENCOUNTER — Other Ambulatory Visit: Payer: Self-pay | Admitting: Cardiovascular Disease

## 2014-07-15 DIAGNOSIS — I10 Essential (primary) hypertension: Secondary | ICD-10-CM | POA: Diagnosis not present

## 2014-07-15 DIAGNOSIS — I252 Old myocardial infarction: Secondary | ICD-10-CM | POA: Diagnosis not present

## 2014-07-15 DIAGNOSIS — Z01818 Encounter for other preprocedural examination: Secondary | ICD-10-CM | POA: Diagnosis not present

## 2014-07-15 DIAGNOSIS — Z955 Presence of coronary angioplasty implant and graft: Secondary | ICD-10-CM | POA: Diagnosis not present

## 2014-07-15 DIAGNOSIS — Z79899 Other long term (current) drug therapy: Secondary | ICD-10-CM | POA: Diagnosis not present

## 2014-07-15 DIAGNOSIS — I2510 Atherosclerotic heart disease of native coronary artery without angina pectoris: Secondary | ICD-10-CM | POA: Diagnosis not present

## 2014-07-15 DIAGNOSIS — G8918 Other acute postprocedural pain: Secondary | ICD-10-CM | POA: Diagnosis not present

## 2014-07-15 DIAGNOSIS — L905 Scar conditions and fibrosis of skin: Secondary | ICD-10-CM | POA: Diagnosis not present

## 2014-07-15 DIAGNOSIS — R911 Solitary pulmonary nodule: Secondary | ICD-10-CM | POA: Diagnosis not present

## 2014-07-15 DIAGNOSIS — Z419 Encounter for procedure for purposes other than remedying health state, unspecified: Secondary | ICD-10-CM | POA: Diagnosis not present

## 2014-07-15 DIAGNOSIS — Z85828 Personal history of other malignant neoplasm of skin: Secondary | ICD-10-CM | POA: Diagnosis not present

## 2014-07-18 DIAGNOSIS — G02 Meningitis in other infectious and parasitic diseases classified elsewhere: Secondary | ICD-10-CM | POA: Diagnosis not present

## 2014-07-18 DIAGNOSIS — B451 Cerebral cryptococcosis: Secondary | ICD-10-CM | POA: Diagnosis not present

## 2014-07-21 ENCOUNTER — Telehealth: Payer: Self-pay | Admitting: Cardiovascular Disease

## 2014-07-21 NOTE — Telephone Encounter (Signed)
Spoke with pt and gave him information from Dr. [name]

## 2014-07-21 NOTE — Telephone Encounter (Signed)
New message         Pt would like a call back / did not disclose any info

## 2014-07-21 NOTE — Telephone Encounter (Signed)
Spoke with pt who reports he is scheduled for surgery on his face on 11/18. Pt is asking if OK to stop ASA and Plavix 4 days prior to surgery

## 2014-07-21 NOTE — Telephone Encounter (Signed)
He can hold his ASA and Plavix for his procedure. cdm

## 2014-07-30 DIAGNOSIS — Z8673 Personal history of transient ischemic attack (TIA), and cerebral infarction without residual deficits: Secondary | ICD-10-CM | POA: Diagnosis not present

## 2014-07-30 DIAGNOSIS — D689 Coagulation defect, unspecified: Secondary | ICD-10-CM | POA: Diagnosis not present

## 2014-07-30 DIAGNOSIS — M952 Other acquired deformity of head: Secondary | ICD-10-CM | POA: Diagnosis not present

## 2014-07-30 DIAGNOSIS — Z9889 Other specified postprocedural states: Secondary | ICD-10-CM | POA: Diagnosis not present

## 2014-07-30 DIAGNOSIS — Z955 Presence of coronary angioplasty implant and graft: Secondary | ICD-10-CM | POA: Diagnosis not present

## 2014-07-30 DIAGNOSIS — E039 Hypothyroidism, unspecified: Secondary | ICD-10-CM | POA: Diagnosis not present

## 2014-07-30 DIAGNOSIS — I10 Essential (primary) hypertension: Secondary | ICD-10-CM | POA: Diagnosis not present

## 2014-07-30 DIAGNOSIS — Z85820 Personal history of malignant melanoma of skin: Secondary | ICD-10-CM | POA: Diagnosis not present

## 2014-07-30 DIAGNOSIS — I2510 Atherosclerotic heart disease of native coronary artery without angina pectoris: Secondary | ICD-10-CM | POA: Diagnosis not present

## 2014-07-30 DIAGNOSIS — I252 Old myocardial infarction: Secondary | ICD-10-CM | POA: Diagnosis not present

## 2014-07-30 DIAGNOSIS — J342 Deviated nasal septum: Secondary | ICD-10-CM | POA: Diagnosis not present

## 2014-07-30 DIAGNOSIS — D68318 Other hemorrhagic disorder due to intrinsic circulating anticoagulants, antibodies, or inhibitors: Secondary | ICD-10-CM | POA: Diagnosis not present

## 2014-07-30 DIAGNOSIS — J3489 Other specified disorders of nose and nasal sinuses: Secondary | ICD-10-CM | POA: Diagnosis not present

## 2014-07-31 ENCOUNTER — Encounter: Payer: Self-pay | Admitting: Family Medicine

## 2014-08-15 DIAGNOSIS — B028 Zoster with other complications: Secondary | ICD-10-CM | POA: Diagnosis not present

## 2014-08-21 ENCOUNTER — Encounter (HOSPITAL_COMMUNITY): Payer: Self-pay | Admitting: Cardiovascular Disease

## 2014-08-28 DIAGNOSIS — H02021 Mechanical entropion of right upper eyelid: Secondary | ICD-10-CM | POA: Diagnosis not present

## 2014-09-12 HISTORY — PX: ENUCLEATION: SHX628

## 2014-09-17 DIAGNOSIS — B028 Zoster with other complications: Secondary | ICD-10-CM | POA: Diagnosis not present

## 2014-09-19 ENCOUNTER — Ambulatory Visit (INDEPENDENT_AMBULATORY_CARE_PROVIDER_SITE_OTHER): Payer: Medicare Other | Admitting: Cardiovascular Disease

## 2014-09-19 ENCOUNTER — Encounter: Payer: Self-pay | Admitting: Cardiovascular Disease

## 2014-09-19 VITALS — BP 140/94 | HR 75 | Ht 68.0 in | Wt 170.0 lb

## 2014-09-19 DIAGNOSIS — I480 Paroxysmal atrial fibrillation: Secondary | ICD-10-CM

## 2014-09-19 DIAGNOSIS — I2510 Atherosclerotic heart disease of native coronary artery without angina pectoris: Secondary | ICD-10-CM

## 2014-09-19 DIAGNOSIS — N183 Chronic kidney disease, stage 3 (moderate): Secondary | ICD-10-CM | POA: Diagnosis not present

## 2014-09-19 DIAGNOSIS — I4891 Unspecified atrial fibrillation: Secondary | ICD-10-CM

## 2014-09-19 LAB — BASIC METABOLIC PANEL
BUN: 36 mg/dL — ABNORMAL HIGH (ref 6–23)
CHLORIDE: 101 meq/L (ref 96–112)
CO2: 27 mEq/L (ref 19–32)
Calcium: 9.5 mg/dL (ref 8.4–10.5)
Creatinine, Ser: 2.4 mg/dL — ABNORMAL HIGH (ref 0.4–1.5)
GFR: 28.44 mL/min — AB (ref >60.00)
Glucose, Bld: 214 mg/dL — ABNORMAL HIGH (ref 70–99)
POTASSIUM: 4.5 meq/L (ref 3.5–5.1)
SODIUM: 138 meq/L (ref 135–145)

## 2014-09-19 LAB — LIPID PANEL
Cholesterol: 274 mg/dL — ABNORMAL HIGH (ref 0–200)
HDL: 41.3 mg/dL (ref >39.00)
NONHDL: 232.7
Total CHOL/HDL Ratio: 7
VLDL: 96 mg/dL — ABNORMAL HIGH (ref 0.0–40.0)

## 2014-09-19 LAB — HEPATIC FUNCTION PANEL
ALT: 24 U/L (ref 0–53)
AST: 25 U/L (ref 0–37)
Albumin: 3.8 g/dL (ref 3.5–5.2)
Alkaline Phosphatase: 111 U/L (ref 39–117)
BILIRUBIN DIRECT: 0.1 mg/dL (ref 0.0–0.3)
BILIRUBIN TOTAL: 0.8 mg/dL (ref 0.2–1.2)
Total Protein: 6.6 g/dL (ref 6.0–8.3)

## 2014-09-19 LAB — LDL CHOLESTEROL, DIRECT: Direct LDL: 142.5 mg/dL

## 2014-09-19 NOTE — Patient Instructions (Signed)
Your physician wants you to follow-up in:  6 months. You will receive a reminder letter in the mail two months in advance. If you don't receive a letter, please call our office to schedule the follow-up appointment.   

## 2014-09-19 NOTE — Progress Notes (Signed)
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 He was admitted to [facility] 12/05/12 with anterior STEMI complicated by ventricular fibrillation and cardiogenic shock. A drug eluting eluting stent (3.0 x 38 mm Promus Premier post-dilated with a 3.5 Steward balloon) was placed in the LAD. The small non-dominant RCA was occluded at beginning of case and reperfused with medical therapy during case. An IABP was placed and he required support with pressor agents for 48 hours post cath. He was placed on amiodarone but this was stopped before discharge secondary to bradycardia. Echo 12/06/12 with preserved LV systolic function. Cath also showed a moderately severe stenosis in a moderate caliber intermediate branch which we chose to manage medically. I arranged a stress myoview after his August 2014 appt but he cancelled. He was admitted to [facility] November 2014 with N/V, diarrhea, confusion, gait instability after radiation therapy to right cheek. Negative hypercoagulable workup. Vasculitis labs negative. 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 TEE without left atrial thrombus. 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 Event monitor 09/27/13  showed atrial fibrillation. He discussed anticoagulation with the Neurology clinic at [facility], MD) but wanted to hold off on anti-coagulation until he was seen in our office. I saw him in February 2015 and he was started on coumadin, ASA was held and Brilinta was replaced with Plavix. He came in for INR check in early March in our office and fell, hit his head and had a high INR. Coumadin was stopped and ASA restarted. The decision was made not to restart anti-coagulation. Admitted to [facility] March 2015 for  CNS fungal infection, treated with Diflucan.   He is here today for follow up. No chest pain or SOB. No awareness of irregularity of his heart rhythm. He has been undergoing procedures on his face.   Primary Care Physician: Elsie Stain  Nephrology: Dr. Dayle Points at [facility] ENT: Dr. Colin Benton [facility]) Neurology: [facility]) [place]  Last Lipid Profile:Lipid Panel     Component Value Date/Time   CHOL 176 11/12/2013 0234   TRIG 265* 11/12/2013 0234   HDL 40 11/12/2013 0234   CHOLHDL 4.4 11/12/2013 0234   VLDL 53* 11/12/2013 0234   LDLCALC 83 11/12/2013 0234     Past Medical History  Diagnosis Date  . CAD (coronary artery disease)   . Hypertension   . Hypothyroidism   . Benign prostatic hypertrophy     a. Nonobstructive on 2004 cath b. anterior STEMI s/p DES-pLAD 12/05/12  . Hyperlipidemia   . Diabetes mellitus without complication   . Stroke 2009  . GERD (gastroesophageal reflux disease)   . Cancer     skin cancer  . History of shingles 08/2012    Lt eye shingles  . 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 Creat was 6 when he rec'd a deceased donor transplant October 04, 2010 at [facility].  According to patient he did OK then in the first few weeks after surgery had to go back in hospital for "BP" problems that "messed the kidney up". Since then creatinine has been in 2.3-3.0 range.  Sees only [place] nephrologists, gets most care there.  Takes prograf and Myfortic, no steroids.  PKD kidneys were left in place.    Marland Kitchen  Chronic kidney disease (CKD), stage IV (severe) 12/08/2012    Of renal transplant, done Jan 2012 at [facility]   . Cardiac arrest 12/05/2012    In the setting of anterior STEMI  . 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 Kidney transplant      09/2010  . Other surgical history  11/21/12    cancerous cells removed from mid upper chest  . Eye  surgery    . Other surgical history N/A 11/15/2012    pt had skin cancer removed from mid upper chest  . Coronary angioplasty with stent placement  12/05/2012    30% oLAD, 20% mLAD, 95-99% pLAD s/p DES, 60% oD1, 70-80% pRamus, 40% pLCx, 50% AV groove Cx, 50% PLB, 100% mRCA s/p recanalization intra-procedurally, noted to be small non-dominant with 70% pRCA stenosis post-procedure  . Cardiac catheterization  2004    Nonobstructive  . Facial reconstruction surgery  2015    [facility]  . Left heart catheterization with coronary angiogram N/A 12/05/2012    Procedure: LEFT HEART CATHETERIZATION WITH CORONARY ANGIOGRAM;  Surgeon: Burnell Blanks, MD;  Location: [facility] CATH LAB;  Service: Cardiovascular;  Laterality: N/A;    Current Outpatient Prescriptions  Medication Sig Dispense Refill  . aspirin 81 MG tablet Take 81 mg by mouth daily.    Marland Kitchen atorvastatin (LIPITOR) 10 MG tablet Take 10 mg by mouth every evening.     . clopidogrel (PLAVIX) 75 MG tablet Take 1 tablet (75 mg total) by mouth daily. 30 tablet 6  . clopidogrel (PLAVIX) 75 MG tablet TAKE 1 TABLET (75 MG TOTAL) BY MOUTH DAILY. 30 tablet 1  . esomeprazole (NEXIUM) 40 MG capsule Take 40 mg by mouth.    . fluconazole (DIFLUCAN) 200 MG tablet Take 200 mg by mouth 4 (four) times daily.    Marland Kitchen levothyroxine (SYNTHROID, LEVOTHROID) 100 MCG tablet Take 100 mcg by mouth daily before breakfast.    . magnesium oxide (MAG-OX) 400 MG tablet Take 400 mg by mouth.    . metoprolol tartrate (LOPRESSOR) 25 MG tablet TAKE 1 TABLET BY MOUTH 2 TIMES DAILY. MAY FILL 01/13/2014. 60 tablet 3  . nitroGLYCERIN (NITROSTAT) 0.4 MG SL tablet Place 1 tablet (0.4 mg total) under the tongue every 5 (five) minutes x 3 doses as needed for chest pain. 25 tablet 3  . predniSONE (DELTASONE) 10 MG tablet Take 1 tablet (10 mg total) by mouth daily with breakfast.    . sertraline (ZOLOFT) 100 MG tablet Take 100 mg by mouth daily.     . sodium bicarbonate 650 MG tablet Take 650 mg by mouth  2 (two) times daily.      No current facility-administered medications for this visit.    No Known Allergies  History   Social History  . Marital Status: Married    Spouse Name: N/A    Number of Children: N/A  . Years of Education: N/A   Occupational History  . 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 part owner in a [place] Topics  . Smoking status: Never Smoker   . Smokeless tobacco: Never Used  . Alcohol Use: No  . Drug Use: No  . Sexual Activity: Yes   Other Topics Concern  . Not on file   Social History Narrative   Running  lawn care service, part time.     Married 1966    Family History  Problem Relation Age of Onset  . Kidney failure Mother     was on dialysis 3 times a week when she died  . Alcohol abuse Mother   . Hypertension Father   . Heart attack Sister   . Kidney disease Sister   . Aneurysm Sister   . COPD Sister   . Colon cancer Neg Hx   . Prostate cancer Neg Hx     Review of Systems:  As stated in the HPI and otherwise negative.   BP 140/94 mmHg  Pulse 75  Ht 5\' 8"  (1.727 m)  Wt 170 lb (77.111 kg)  BMI 25.85 kg/m2  SpO2 99%  Physical Examination: General: Well developed, well nourished, NAD HEENT: OP clear, mucus membranes moist SKIN: warm, dry. No rashes. Neuro: No focal deficits Musculoskeletal: Muscle strength 5/5 all ext Psychiatric: Mood and affect normal Neck: No JVD, no carotid bruits, no thyromegaly, no lymphadenopathy. Lungs:Clear bilaterally, no wheezes, rhonci, crackles Cardiovascular: Regular rate and rhythm. No murmurs, gallops or rubs. Abdomen:Soft. Bowel sounds present. Non-tender.  Extremities: No lower extremity edema. Pulses are 2 + in the bilateral DP/PT.  Cardiac cath 12/05/12:  Left main: 30% ostial stenosis. 20% mid stenosis.  Left Anterior Descending Artery: Large caliber vessel that courses to the apex. The proximal vessel is hazy with diffuse 95-99% stenosis  extending from the ostium down to the first diagonal branch. The remainder of the LAD has no flow limiting disease. The diagonal branch is patent ostial 60% stenosis.  Ramus Intermediate: Moderate caliber vessel with 70-80% proximal stenosis.  Circumflex Artery: Large caliber, dominant vessel with 40% proximal stenosis. The first obtuse marginal branch is small in caliber with no obstructive disease. The second obtuse marginal branch is small with no disease. The distal AV groove Circumflex has a focal 50% stenosis. The left posterolateral branch has 50% stenosis.  Right Coronary Artery: Small caliber (1.75 mm) non-dominant vessel with 100% mid occlusion. (This vessel recanalized during the procedure and at the end of the procedure was noted to be small, non-dominant with a proximal 70% stenosis.   Echo 12/06/12:  Left ventricle: The cavity size was normal. Wall thickness was increased in a pattern of mild LVH. Systolic function was normal. The estimated ejection fraction was in the range of 60% to 65%. Wall motion was normal; there were no regional wall motion abnormalities. - Right ventricle: The cavity size was mildly dilated. Systolic function was mildly reduced.  EKG: NSR, rate 75 bpm  Assessment and Plan:   1. CAD: Stable. No recent chest pain. Anterior STEMI March 5170 complicated by incessant VF and cardiogenic shock s/p DES x 1 proximal LAD. Small non-dominant RCA was occluded at beginning of case and reperfused with medical therapy during case. Moderate disease in intermediate branch which we have managed medically. Echo 12/06/12 with preserved LV systolic function. He is now on ASA and Plavix. Continue statin, beta blocker.  2. Chronic kidney disease, stage III s/p kidney transplant: Stable. Followed in Nephrology.   3. HTN: BP controlled. No changes today. Check BMET today.   4. Paroxysmal, atrial fibrillation: Sinus today. He is only on ASA. Coumadin was tried but we could not  control his INR. He understands risk of CVA with recurrent a. Fib but given multiple complex issues, we will not start anti-coagulation.   5. Hyperlipidemia: Check lipids today

## 2014-09-25 DIAGNOSIS — H02101 Unspecified ectropion of right upper eyelid: Secondary | ICD-10-CM | POA: Diagnosis not present

## 2014-09-30 DIAGNOSIS — E782 Mixed hyperlipidemia: Secondary | ICD-10-CM | POA: Diagnosis not present

## 2014-10-07 DIAGNOSIS — Z9889 Other specified postprocedural states: Secondary | ICD-10-CM | POA: Diagnosis not present

## 2014-10-07 DIAGNOSIS — Z923 Personal history of irradiation: Secondary | ICD-10-CM | POA: Diagnosis not present

## 2014-10-07 DIAGNOSIS — Z85828 Personal history of other malignant neoplasm of skin: Secondary | ICD-10-CM | POA: Diagnosis not present

## 2014-10-07 DIAGNOSIS — C44329 Squamous cell carcinoma of skin of other parts of face: Secondary | ICD-10-CM | POA: Diagnosis not present

## 2014-10-07 DIAGNOSIS — Z08 Encounter for follow-up examination after completed treatment for malignant neoplasm: Secondary | ICD-10-CM | POA: Diagnosis not present

## 2014-10-15 DIAGNOSIS — T8131XA Disruption of external operation (surgical) wound, not elsewhere classified, initial encounter: Secondary | ICD-10-CM | POA: Diagnosis not present

## 2014-10-16 ENCOUNTER — Telehealth: Payer: Self-pay

## 2014-10-16 DIAGNOSIS — E039 Hypothyroidism, unspecified: Secondary | ICD-10-CM

## 2014-10-16 DIAGNOSIS — N184 Chronic kidney disease, stage 4 (severe): Secondary | ICD-10-CM | POA: Diagnosis not present

## 2014-10-16 DIAGNOSIS — Z7952 Long term (current) use of systemic steroids: Secondary | ICD-10-CM | POA: Diagnosis not present

## 2014-10-16 DIAGNOSIS — Q612 Polycystic kidney, adult type: Secondary | ICD-10-CM | POA: Diagnosis not present

## 2014-10-16 DIAGNOSIS — E875 Hyperkalemia: Secondary | ICD-10-CM | POA: Diagnosis not present

## 2014-10-16 DIAGNOSIS — I129 Hypertensive chronic kidney disease with stage 1 through stage 4 chronic kidney disease, or unspecified chronic kidney disease: Secondary | ICD-10-CM | POA: Diagnosis not present

## 2014-10-16 DIAGNOSIS — E785 Hyperlipidemia, unspecified: Secondary | ICD-10-CM | POA: Diagnosis not present

## 2014-10-16 DIAGNOSIS — Z792 Long term (current) use of antibiotics: Secondary | ICD-10-CM | POA: Diagnosis not present

## 2014-10-16 DIAGNOSIS — Z7982 Long term (current) use of aspirin: Secondary | ICD-10-CM | POA: Diagnosis not present

## 2014-10-16 DIAGNOSIS — Z85828 Personal history of other malignant neoplasm of skin: Secondary | ICD-10-CM | POA: Diagnosis not present

## 2014-10-16 DIAGNOSIS — D899 Disorder involving the immune mechanism, unspecified: Secondary | ICD-10-CM | POA: Diagnosis not present

## 2014-10-16 DIAGNOSIS — Z923 Personal history of irradiation: Secondary | ICD-10-CM | POA: Diagnosis not present

## 2014-10-16 DIAGNOSIS — E038 Other specified hypothyroidism: Secondary | ICD-10-CM | POA: Diagnosis not present

## 2014-10-16 DIAGNOSIS — Z940 Kidney transplant status: Secondary | ICD-10-CM | POA: Diagnosis not present

## 2014-10-16 DIAGNOSIS — Z79899 Other long term (current) drug therapy: Secondary | ICD-10-CM | POA: Diagnosis not present

## 2014-10-16 DIAGNOSIS — E872 Acidosis: Secondary | ICD-10-CM | POA: Diagnosis not present

## 2014-10-16 NOTE — Telephone Encounter (Signed)
Beth nurse with nephrology at [facility] left v/m; Dr Franchot Gallo is nephrologist and has increased synthroid to  125 mcg once daily. Dr Franchot Gallo prescribed for #90 day rx but any additional refills will need to be done by Dr Damita Dunnings; Dr Franchot Gallo also wants pt to have TSH level in one month. Any questions contact Beth at [phone]. (have not updated med list or scheduled lab appt until Dr Damita Dunnings approves).

## 2014-10-16 NOTE — Telephone Encounter (Signed)
I looked at the [facility] notes.  tsh ordered.   Please update med list and schedule f/u lab appointment.  Thanks.

## 2014-10-17 ENCOUNTER — Encounter: Payer: Self-pay | Admitting: Family Medicine

## 2014-10-17 DIAGNOSIS — L908 Other atrophic disorders of skin: Secondary | ICD-10-CM | POA: Diagnosis not present

## 2014-10-17 DIAGNOSIS — C44329 Squamous cell carcinoma of skin of other parts of face: Secondary | ICD-10-CM | POA: Diagnosis not present

## 2014-10-17 DIAGNOSIS — H179 Unspecified corneal scar and opacity: Secondary | ICD-10-CM | POA: Diagnosis not present

## 2014-10-17 DIAGNOSIS — H02103 Unspecified ectropion of right eye, unspecified eyelid: Secondary | ICD-10-CM | POA: Diagnosis not present

## 2014-10-17 DIAGNOSIS — H40051 Ocular hypertension, right eye: Secondary | ICD-10-CM | POA: Diagnosis not present

## 2014-10-17 NOTE — Telephone Encounter (Signed)
Patient advised.  Lab appt scheduled. Medication list updated.

## 2014-10-20 ENCOUNTER — Other Ambulatory Visit: Payer: Self-pay | Admitting: Cardiovascular Disease

## 2014-10-29 DIAGNOSIS — B028 Zoster with other complications: Secondary | ICD-10-CM | POA: Diagnosis not present

## 2014-11-11 ENCOUNTER — Ambulatory Visit: Payer: Self-pay

## 2014-11-11 DIAGNOSIS — I4891 Unspecified atrial fibrillation: Secondary | ICD-10-CM

## 2014-11-11 DIAGNOSIS — Z5181 Encounter for therapeutic drug level monitoring: Secondary | ICD-10-CM

## 2014-11-12 DIAGNOSIS — H169 Unspecified keratitis: Secondary | ICD-10-CM | POA: Diagnosis not present

## 2014-11-18 ENCOUNTER — Other Ambulatory Visit (INDEPENDENT_AMBULATORY_CARE_PROVIDER_SITE_OTHER): Payer: Medicare Other

## 2014-11-18 DIAGNOSIS — E039 Hypothyroidism, unspecified: Secondary | ICD-10-CM | POA: Diagnosis not present

## 2014-11-18 LAB — TSH: TSH: 10.55 u[IU]/mL — AB (ref 0.35–4.50)

## 2014-11-19 ENCOUNTER — Other Ambulatory Visit: Payer: Self-pay | Admitting: Family Medicine

## 2014-11-19 DIAGNOSIS — E039 Hypothyroidism, unspecified: Secondary | ICD-10-CM

## 2014-11-19 MED ORDER — LEVOTHYROXINE SODIUM 125 MCG PO TABS: 125.0000 ug | ORAL_TABLET | Freq: Every day | ORAL | Status: DC

## 2014-11-20 ENCOUNTER — Telehealth: Payer: Self-pay

## 2014-11-20 NOTE — Telephone Encounter (Signed)
Patient aware of TSH results and medication change.  Lab scheduled.

## 2014-11-20 NOTE — Telephone Encounter (Signed)
-----   Message from Tonia Ghent, MD sent at 11/19/2014 11:45 PM EST ----- Notify pt.  TSH up.  Needs to inc med.  Continue 1 day except for 1.5 on Sundays.  New rx sent.  Recheck TSH in about 2 months.  Order is in.  Thanks.

## 2014-11-25 ENCOUNTER — Telehealth: Payer: Self-pay | Admitting: *Deleted

## 2014-11-25 DIAGNOSIS — E785 Hyperlipidemia, unspecified: Secondary | ICD-10-CM

## 2014-11-25 NOTE — Telephone Encounter (Signed)
Pt was to have fasting lipid profile done in [place] and faxed to our office. We have not received results.  I placed call to pt.  I spoke with his wife and told her we had not received results. She will check with pt. Pt's wife aware prescription for lab work has been mailed to pt

## 2014-12-01 ENCOUNTER — Other Ambulatory Visit (INDEPENDENT_AMBULATORY_CARE_PROVIDER_SITE_OTHER): Payer: Medicare Other | Admitting: *Deleted

## 2014-12-01 DIAGNOSIS — E785 Hyperlipidemia, unspecified: Secondary | ICD-10-CM

## 2014-12-01 LAB — LIPID PANEL
CHOL/HDL RATIO: 6
CHOLESTEROL: 257 mg/dL — AB (ref 0–200)
HDL: 41.8 mg/dL (ref >39.00)

## 2014-12-01 LAB — LDL CHOLESTEROL, DIRECT: LDL DIRECT: 126 mg/dL

## 2014-12-01 NOTE — Telephone Encounter (Signed)
Spoke with pt. He does not have prescription for labs.  I explained to him why fasting labs need to be checked. He has not eaten yet today and will come to our office this AM for fasting lipid profile.

## 2014-12-01 NOTE — Telephone Encounter (Signed)
New Message         Pt calling stating that Dr. [name] wants him to have labs done but there are no orders in Epic. Please call back and advise.

## 2014-12-03 ENCOUNTER — Other Ambulatory Visit: Payer: Self-pay | Admitting: *Deleted

## 2014-12-03 DIAGNOSIS — E785 Hyperlipidemia, unspecified: Secondary | ICD-10-CM

## 2014-12-03 MED ORDER — ATORVASTATIN CALCIUM 20 MG PO TABS: 20.0000 mg | ORAL_TABLET | Freq: Every day | ORAL | Status: DC

## 2014-12-09 ENCOUNTER — Telehealth: Payer: Self-pay

## 2014-12-09 NOTE — Telephone Encounter (Signed)
Left message for pt to call back if he still wants flu vaccine

## 2014-12-11 ENCOUNTER — Ambulatory Visit (INDEPENDENT_AMBULATORY_CARE_PROVIDER_SITE_OTHER): Payer: Medicare Other | Admitting: *Deleted

## 2014-12-11 DIAGNOSIS — Z23 Encounter for immunization: Secondary | ICD-10-CM | POA: Diagnosis not present

## 2014-12-18 ENCOUNTER — Other Ambulatory Visit
Admit: 2014-12-18 | Disposition: A | Discharge status: Home / Self Care | Payer: Self-pay | Attending: Ophthalmology | Admitting: Ophthalmology

## 2014-12-18 DIAGNOSIS — H16001 Unspecified corneal ulcer, right eye: Secondary | ICD-10-CM | POA: Diagnosis not present

## 2014-12-19 DIAGNOSIS — H16001 Unspecified corneal ulcer, right eye: Secondary | ICD-10-CM | POA: Diagnosis not present

## 2014-12-22 LAB — EYE CULTURE

## 2014-12-23 DIAGNOSIS — H18421 Band keratopathy, right eye: Secondary | ICD-10-CM | POA: Diagnosis not present

## 2014-12-26 DIAGNOSIS — H18421 Band keratopathy, right eye: Secondary | ICD-10-CM | POA: Diagnosis not present

## 2014-12-31 DIAGNOSIS — M488X9 Other specified spondylopathies, site unspecified: Secondary | ICD-10-CM | POA: Diagnosis not present

## 2014-12-31 DIAGNOSIS — S2242XD Multiple fractures of ribs, left side, subsequent encounter for fracture with routine healing: Secondary | ICD-10-CM | POA: Diagnosis not present

## 2014-12-31 DIAGNOSIS — R918 Other nonspecific abnormal finding of lung field: Secondary | ICD-10-CM | POA: Diagnosis not present

## 2014-12-31 DIAGNOSIS — I700 Atherosclerosis of aorta: Secondary | ICD-10-CM | POA: Diagnosis not present

## 2014-12-31 DIAGNOSIS — R590 Localized enlarged lymph nodes: Secondary | ICD-10-CM | POA: Diagnosis not present

## 2015-01-02 DIAGNOSIS — H18421 Band keratopathy, right eye: Secondary | ICD-10-CM | POA: Diagnosis not present

## 2015-01-08 LAB — CULTURE, FUNGUS WITHOUT SMEAR

## 2015-01-16 DIAGNOSIS — B028 Zoster with other complications: Secondary | ICD-10-CM | POA: Diagnosis not present

## 2015-01-20 ENCOUNTER — Other Ambulatory Visit: Payer: Medicare Other

## 2015-01-21 ENCOUNTER — Other Ambulatory Visit (INDEPENDENT_AMBULATORY_CARE_PROVIDER_SITE_OTHER): Payer: Medicare Other

## 2015-01-21 DIAGNOSIS — E785 Hyperlipidemia, unspecified: Secondary | ICD-10-CM | POA: Diagnosis not present

## 2015-01-21 DIAGNOSIS — E039 Hypothyroidism, unspecified: Secondary | ICD-10-CM | POA: Diagnosis not present

## 2015-01-21 LAB — LIPID PANEL
CHOL/HDL RATIO: 5
CHOLESTEROL: 217 mg/dL — AB (ref 0–200)
HDL: 43.5 mg/dL (ref >39.00)
NonHDL: 173.5
Triglycerides: 379 mg/dL — ABNORMAL HIGH (ref 0.0–149.0)
VLDL: 75.8 mg/dL — AB (ref 0.0–40.0)

## 2015-01-21 LAB — HEPATIC FUNCTION PANEL
ALT: 29 U/L (ref 0–53)
AST: 25 U/L (ref 0–37)
Albumin: 3.7 g/dL (ref 3.5–5.2)
Alkaline Phosphatase: 87 U/L (ref 39–117)
Bilirubin, Direct: 0.1 mg/dL (ref 0.0–0.3)
Total Bilirubin: 0.6 mg/dL (ref 0.2–1.2)
Total Protein: 6.6 g/dL (ref 6.0–8.3)

## 2015-01-21 LAB — LDL CHOLESTEROL, DIRECT: LDL DIRECT: 103 mg/dL

## 2015-01-21 LAB — TSH: TSH: 0.11 u[IU]/mL — AB (ref 0.35–4.50)

## 2015-01-22 ENCOUNTER — Other Ambulatory Visit: Payer: Self-pay | Admitting: Family Medicine

## 2015-01-22 DIAGNOSIS — E039 Hypothyroidism, unspecified: Secondary | ICD-10-CM

## 2015-01-23 DIAGNOSIS — B028 Zoster with other complications: Secondary | ICD-10-CM | POA: Diagnosis not present

## 2015-01-25 ENCOUNTER — Other Ambulatory Visit: Payer: Self-pay | Admitting: Family Medicine

## 2015-01-25 DIAGNOSIS — E039 Hypothyroidism, unspecified: Secondary | ICD-10-CM

## 2015-01-25 MED ORDER — LEVOTHYROXINE SODIUM 125 MCG PO TABS: 125.0000 ug | ORAL_TABLET | Freq: Every day | ORAL | Status: DC

## 2015-01-26 DIAGNOSIS — D0461 Carcinoma in situ of skin of right upper limb, including shoulder: Secondary | ICD-10-CM | POA: Diagnosis not present

## 2015-01-26 DIAGNOSIS — D485 Neoplasm of uncertain behavior of skin: Secondary | ICD-10-CM | POA: Diagnosis not present

## 2015-01-26 DIAGNOSIS — L570 Actinic keratosis: Secondary | ICD-10-CM | POA: Diagnosis not present

## 2015-01-26 DIAGNOSIS — D0422 Carcinoma in situ of skin of left ear and external auricular canal: Secondary | ICD-10-CM | POA: Diagnosis not present

## 2015-01-26 DIAGNOSIS — D0421 Carcinoma in situ of skin of right ear and external auricular canal: Secondary | ICD-10-CM | POA: Diagnosis not present

## 2015-01-26 DIAGNOSIS — D0462 Carcinoma in situ of skin of left upper limb, including shoulder: Secondary | ICD-10-CM | POA: Diagnosis not present

## 2015-01-26 DIAGNOSIS — X32XXXA Exposure to sunlight, initial encounter: Secondary | ICD-10-CM | POA: Diagnosis not present

## 2015-01-26 DIAGNOSIS — Z85828 Personal history of other malignant neoplasm of skin: Secondary | ICD-10-CM | POA: Diagnosis not present

## 2015-01-29 DIAGNOSIS — H02233 Paralytic lagophthalmos right eye, unspecified eyelid: Secondary | ICD-10-CM | POA: Diagnosis not present

## 2015-01-29 DIAGNOSIS — H179 Unspecified corneal scar and opacity: Secondary | ICD-10-CM | POA: Diagnosis not present

## 2015-01-29 DIAGNOSIS — H02532 Eyelid retraction right lower eyelid: Secondary | ICD-10-CM | POA: Diagnosis not present

## 2015-02-05 ENCOUNTER — Encounter: Payer: Self-pay | Admitting: Internal Medicine

## 2015-02-05 DIAGNOSIS — H02231 Paralytic lagophthalmos right upper eyelid: Secondary | ICD-10-CM | POA: Diagnosis not present

## 2015-02-05 DIAGNOSIS — H16211 Exposure keratoconjunctivitis, right eye: Secondary | ICD-10-CM | POA: Diagnosis not present

## 2015-02-08 ENCOUNTER — Other Ambulatory Visit: Payer: Self-pay | Admitting: Cardiovascular Disease

## 2015-02-10 DIAGNOSIS — B028 Zoster with other complications: Secondary | ICD-10-CM | POA: Diagnosis not present

## 2015-02-10 DIAGNOSIS — C44329 Squamous cell carcinoma of skin of other parts of face: Secondary | ICD-10-CM | POA: Diagnosis not present

## 2015-02-10 DIAGNOSIS — M952 Other acquired deformity of head: Secondary | ICD-10-CM | POA: Diagnosis not present

## 2015-02-10 DIAGNOSIS — J3489 Other specified disorders of nose and nasal sinuses: Secondary | ICD-10-CM | POA: Diagnosis not present

## 2015-02-10 DIAGNOSIS — J342 Deviated nasal septum: Secondary | ICD-10-CM | POA: Diagnosis not present

## 2015-02-17 ENCOUNTER — Emergency Department (HOSPITAL_COMMUNITY): Payer: Medicare Other

## 2015-02-17 ENCOUNTER — Encounter (HOSPITAL_COMMUNITY): Payer: Self-pay

## 2015-02-17 ENCOUNTER — Inpatient Hospital Stay (HOSPITAL_COMMUNITY)
Admission: EM | Admit: 2015-02-17 | Discharge: 2015-02-20 | DRG: 309 | Disposition: A | Discharge status: Home / Self Care | Payer: Medicare Other | Attending: Cardiovascular Disease | Admitting: Cardiovascular Disease

## 2015-02-17 ENCOUNTER — Ambulatory Visit (INDEPENDENT_AMBULATORY_CARE_PROVIDER_SITE_OTHER)
Admission: RE | Admit: 2015-02-17 | Discharge: 2015-02-17 | Disposition: A | Discharge status: Home / Self Care | Payer: Medicare Other | Source: Ambulatory Visit | Attending: Internal Medicine | Admitting: Internal Medicine

## 2015-02-17 ENCOUNTER — Encounter: Payer: Self-pay | Admitting: Internal Medicine

## 2015-02-17 ENCOUNTER — Ambulatory Visit (INDEPENDENT_AMBULATORY_CARE_PROVIDER_SITE_OTHER): Payer: Medicare Other | Admitting: Internal Medicine

## 2015-02-17 VITALS — BP 132/62 | HR 110 | Temp 98.2°F | Wt 163.0 lb

## 2015-02-17 DIAGNOSIS — R059 Cough, unspecified: Secondary | ICD-10-CM

## 2015-02-17 DIAGNOSIS — E039 Hypothyroidism, unspecified: Secondary | ICD-10-CM | POA: Diagnosis present

## 2015-02-17 DIAGNOSIS — J40 Bronchitis, not specified as acute or chronic: Secondary | ICD-10-CM | POA: Diagnosis present

## 2015-02-17 DIAGNOSIS — Z7982 Long term (current) use of aspirin: Secondary | ICD-10-CM | POA: Diagnosis not present

## 2015-02-17 DIAGNOSIS — I4891 Unspecified atrial fibrillation: Secondary | ICD-10-CM | POA: Diagnosis not present

## 2015-02-17 DIAGNOSIS — I480 Paroxysmal atrial fibrillation: Principal | ICD-10-CM

## 2015-02-17 DIAGNOSIS — Z940 Kidney transplant status: Secondary | ICD-10-CM

## 2015-02-17 DIAGNOSIS — Z8674 Personal history of sudden cardiac arrest: Secondary | ICD-10-CM | POA: Diagnosis not present

## 2015-02-17 DIAGNOSIS — I10 Essential (primary) hypertension: Secondary | ICD-10-CM

## 2015-02-17 DIAGNOSIS — Z7952 Long term (current) use of systemic steroids: Secondary | ICD-10-CM | POA: Diagnosis not present

## 2015-02-17 DIAGNOSIS — Z955 Presence of coronary angioplasty implant and graft: Secondary | ICD-10-CM | POA: Diagnosis not present

## 2015-02-17 DIAGNOSIS — E782 Mixed hyperlipidemia: Secondary | ICD-10-CM | POA: Diagnosis present

## 2015-02-17 DIAGNOSIS — I248 Other forms of acute ischemic heart disease: Secondary | ICD-10-CM | POA: Diagnosis present

## 2015-02-17 DIAGNOSIS — I129 Hypertensive chronic kidney disease with stage 1 through stage 4 chronic kidney disease, or unspecified chronic kidney disease: Secondary | ICD-10-CM | POA: Diagnosis present

## 2015-02-17 DIAGNOSIS — N400 Enlarged prostate without lower urinary tract symptoms: Secondary | ICD-10-CM | POA: Diagnosis present

## 2015-02-17 DIAGNOSIS — H5441 Blindness, right eye, normal vision left eye: Secondary | ICD-10-CM | POA: Diagnosis present

## 2015-02-17 DIAGNOSIS — Q612 Polycystic kidney, adult type: Secondary | ICD-10-CM | POA: Diagnosis not present

## 2015-02-17 DIAGNOSIS — E119 Type 2 diabetes mellitus without complications: Secondary | ICD-10-CM | POA: Diagnosis present

## 2015-02-17 DIAGNOSIS — Z8673 Personal history of transient ischemic attack (TIA), and cerebral infarction without residual deficits: Secondary | ICD-10-CM | POA: Diagnosis not present

## 2015-02-17 DIAGNOSIS — R000 Tachycardia, unspecified: Secondary | ICD-10-CM | POA: Diagnosis not present

## 2015-02-17 DIAGNOSIS — I2510 Atherosclerotic heart disease of native coronary artery without angina pectoris: Secondary | ICD-10-CM

## 2015-02-17 DIAGNOSIS — B029 Zoster without complications: Secondary | ICD-10-CM | POA: Diagnosis present

## 2015-02-17 DIAGNOSIS — K219 Gastro-esophageal reflux disease without esophagitis: Secondary | ICD-10-CM | POA: Diagnosis present

## 2015-02-17 DIAGNOSIS — R05 Cough: Secondary | ICD-10-CM

## 2015-02-17 DIAGNOSIS — N184 Chronic kidney disease, stage 4 (severe): Secondary | ICD-10-CM | POA: Diagnosis present

## 2015-02-17 DIAGNOSIS — I252 Old myocardial infarction: Secondary | ICD-10-CM

## 2015-02-17 DIAGNOSIS — Z85828 Personal history of other malignant neoplasm of skin: Secondary | ICD-10-CM

## 2015-02-17 DIAGNOSIS — R0602 Shortness of breath: Secondary | ICD-10-CM

## 2015-02-17 DIAGNOSIS — I4892 Unspecified atrial flutter: Secondary | ICD-10-CM | POA: Diagnosis present

## 2015-02-17 LAB — CBC
HCT: 43.2 % (ref 39.0–52.0)
HEMOGLOBIN: 14 g/dL (ref 13.0–17.0)
MCH: 27.2 pg (ref 26.0–34.0)
MCHC: 32.4 g/dL (ref 30.0–36.0)
MCV: 83.9 fL (ref 78.0–100.0)
Platelets: 166 10*3/uL (ref 150–400)
RBC: 5.15 MIL/uL (ref 4.22–5.81)
RDW: 15.5 % (ref 11.5–15.5)
WBC: 11.2 10*3/uL — ABNORMAL HIGH (ref 4.0–10.5)

## 2015-02-17 LAB — TROPONIN I
Troponin I: 0.09 ng/mL — ABNORMAL HIGH (ref <0.031)
Troponin I: 0.12 ng/mL — ABNORMAL HIGH (ref <0.031)

## 2015-02-17 LAB — BASIC METABOLIC PANEL
ANION GAP: 17 — AB (ref 5–15)
BUN: 38 mg/dL — AB (ref 6–20)
CO2: 23 mmol/L (ref 22–32)
Calcium: 9.4 mg/dL (ref 8.9–10.3)
Chloride: 96 mmol/L — ABNORMAL LOW (ref 101–111)
Creatinine, Ser: 2.73 mg/dL — ABNORMAL HIGH (ref 0.61–1.24)
GFR calc Af Amer: 26 mL/min — ABNORMAL LOW (ref >60)
GFR calc non Af Amer: 22 mL/min — ABNORMAL LOW (ref >60)
GLUCOSE: 161 mg/dL — AB (ref 65–99)
POTASSIUM: 4.1 mmol/L (ref 3.5–5.1)
SODIUM: 136 mmol/L (ref 135–145)

## 2015-02-17 LAB — I-STAT TROPONIN, ED: TROPONIN I, POC: 0.08 ng/mL (ref 0.00–0.08)

## 2015-02-17 LAB — MAGNESIUM: MAGNESIUM: 1.9 mg/dL (ref 1.7–2.4)

## 2015-02-17 MED ORDER — SERTRALINE HCL 100 MG PO TABS
100.0000 mg | ORAL_TABLET | Freq: Every day | ORAL | Status: DC
  Administered 2015-02-17 – 2015-02-20 (×4): 100 mg via ORAL
  Filled 2015-02-17 (×4): qty 1

## 2015-02-17 MED ORDER — TIMOLOL MALEATE 0.5 % OP SOLN
1.0000 [drp] | Freq: Two times a day (BID) | OPHTHALMIC | Status: DC
  Administered 2015-02-17 – 2015-02-20 (×6): 1 [drp] via OPHTHALMIC
  Filled 2015-02-17: qty 5

## 2015-02-17 MED ORDER — SODIUM BICARBONATE 650 MG PO TABS
650.0000 mg | ORAL_TABLET | Freq: Three times a day (TID) | ORAL | Status: DC
  Administered 2015-02-17 – 2015-02-20 (×8): 650 mg via ORAL
  Filled 2015-02-17 (×8): qty 1

## 2015-02-17 MED ORDER — ACETAMINOPHEN 325 MG PO TABS
650.0000 mg | ORAL_TABLET | ORAL | Status: DC | PRN
  Administered 2015-02-18: 650 mg via ORAL
  Filled 2015-02-17: qty 2

## 2015-02-17 MED ORDER — MAGNESIUM OXIDE 400 MG PO TABS
400.0000 mg | ORAL_TABLET | Freq: Three times a day (TID) | ORAL | Status: DC
  Administered 2015-02-17 – 2015-02-20 (×8): 400 mg via ORAL
  Filled 2015-02-17 (×17): qty 1

## 2015-02-17 MED ORDER — PANTOPRAZOLE SODIUM 40 MG PO TBEC
80.0000 mg | DELAYED_RELEASE_TABLET | Freq: Every day | ORAL | Status: DC
  Administered 2015-02-18 – 2015-02-19 (×2): 80 mg via ORAL
  Filled 2015-02-17 (×2): qty 2

## 2015-02-17 MED ORDER — NITROGLYCERIN 0.4 MG SL SUBL: 0.4000 mg | SUBLINGUAL_TABLET | SUBLINGUAL | Status: DC | PRN

## 2015-02-17 MED ORDER — BRIMONIDINE TARTRATE-TIMOLOL 0.2-0.5 % OP SOLN: 1.0000 [drp] | Freq: Two times a day (BID) | OPHTHALMIC | Status: DC

## 2015-02-17 MED ORDER — DEXTROSE 5 % IV SOLN
5.0000 mg/h | INTRAVENOUS | Status: DC
  Administered 2015-02-17 (×2): 15 mg/h via INTRAVENOUS
  Filled 2015-02-17: qty 100

## 2015-02-17 MED ORDER — HEPARIN SODIUM (PORCINE) 5000 UNIT/ML IJ SOLN
5000.0000 [IU] | Freq: Three times a day (TID) | INTRAMUSCULAR | Status: DC
  Administered 2015-02-17 – 2015-02-20 (×7): 5000 [IU] via SUBCUTANEOUS
  Filled 2015-02-17 (×7): qty 1

## 2015-02-17 MED ORDER — ATORVASTATIN CALCIUM 10 MG PO TABS
10.0000 mg | ORAL_TABLET | Freq: Every day | ORAL | Status: DC
  Administered 2015-02-17 – 2015-02-19 (×3): 10 mg via ORAL
  Filled 2015-02-17 (×3): qty 1

## 2015-02-17 MED ORDER — BRIMONIDINE TARTRATE 0.2 % OP SOLN
1.0000 [drp] | Freq: Two times a day (BID) | OPHTHALMIC | Status: DC
  Administered 2015-02-17 – 2015-02-20 (×6): 1 [drp] via OPHTHALMIC
  Filled 2015-02-17: qty 5

## 2015-02-17 MED ORDER — ONDANSETRON HCL 4 MG/2ML IJ SOLN: 4.0000 mg | Freq: Four times a day (QID) | INTRAMUSCULAR | Status: DC | PRN

## 2015-02-17 MED ORDER — DIFLUPREDNATE 0.05 % OP EMUL
1.0000 [drp] | Freq: Every day | OPHTHALMIC | Status: DC
  Administered 2015-02-18 – 2015-02-20 (×3): 1 [drp] via OPHTHALMIC

## 2015-02-17 MED ORDER — DILTIAZEM HCL 100 MG IV SOLR
5.0000 mg/h | Freq: Once | INTRAVENOUS | Status: AC
Start: 2015-02-17 — End: 2015-02-17
  Administered 2015-02-17: 5 mg/h via INTRAVENOUS

## 2015-02-17 MED ORDER — ASPIRIN EC 81 MG PO TBEC
81.0000 mg | DELAYED_RELEASE_TABLET | Freq: Every day | ORAL | Status: DC
Start: 2015-02-17 — End: 2015-02-20
  Administered 2015-02-17 – 2015-02-20 (×4): 81 mg via ORAL
  Filled 2015-02-17 (×7): qty 1

## 2015-02-17 MED ORDER — PREDNISONE 10 MG PO TABS
10.0000 mg | ORAL_TABLET | Freq: Every day | ORAL | Status: DC
  Administered 2015-02-18 – 2015-02-20 (×3): 10 mg via ORAL
  Filled 2015-02-17 (×3): qty 1

## 2015-02-17 MED ORDER — LEVOTHYROXINE SODIUM 75 MCG PO TABS
187.5000 ug | ORAL_TABLET | Freq: Every day | ORAL | Status: DC
  Administered 2015-02-18 – 2015-02-20 (×3): 187.5 ug via ORAL
  Filled 2015-02-17 (×3): qty 3

## 2015-02-17 MED ORDER — FLUCONAZOLE 100 MG PO TABS
100.0000 mg | ORAL_TABLET | Freq: Two times a day (BID) | ORAL | Status: DC
  Administered 2015-02-17 – 2015-02-20 (×6): 100 mg via ORAL
  Filled 2015-02-17 (×8): qty 1

## 2015-02-17 MED ORDER — CLOPIDOGREL BISULFATE 75 MG PO TABS
75.0000 mg | ORAL_TABLET | Freq: Every day | ORAL | Status: DC
  Administered 2015-02-17 – 2015-02-20 (×4): 75 mg via ORAL
  Filled 2015-02-17 (×4): qty 1

## 2015-02-17 MED ORDER — METOPROLOL TARTRATE 25 MG PO TABS
25.0000 mg | ORAL_TABLET | Freq: Two times a day (BID) | ORAL | Status: DC
  Administered 2015-02-17 – 2015-02-18 (×2): 25 mg via ORAL
  Filled 2015-02-17 (×2): qty 1

## 2015-02-17 MED ORDER — DILTIAZEM HCL 25 MG/5ML IV SOLN
10.0000 mg | Freq: Once | INTRAVENOUS | Status: AC
  Administered 2015-02-17: 10 mg via INTRAVENOUS
  Filled 2015-02-17: qty 5

## 2015-02-17 MED ORDER — HYDRALAZINE HCL 50 MG PO TABS
50.0000 mg | ORAL_TABLET | Freq: Three times a day (TID) | ORAL | Status: DC
  Administered 2015-02-17 – 2015-02-20 (×8): 50 mg via ORAL
  Filled 2015-02-17 (×8): qty 1

## 2015-02-17 NOTE — ED Notes (Signed)
Pt has been feeling nauseated since Friday. Went to MD today and sent here after having EKG done and in new onset AFIB.

## 2015-02-17 NOTE — ED Provider Notes (Signed)
CSN: [id number]     Arrival date & time 02/17/15  1229 History   First MD Initiated Contact with Patient 02/17/15 1335     Chief Complaint  Patient presents with  . Atrial Fibrillation  . Fatigue    (Consider location/radiation/quality/duration/timing/severity/associated sxs/prior Treatment) HPI Comments: Patient with history of STEMI status post stenting, renal transplant on prednisone, paroxysmal atrial fibrillation diagnosed in early 2015 on Plavix and a baby aspirin daily -- presents with atrial fibrillation with rapid ventricular rate. Patient has complained of a cough and fatigue for the past 4 days. He denies having chest pain or shortness of breath. No recent fevers, URI symptoms, nausea, vomiting, diarrhea, abdominal pain, urinary symptoms. He denies swelling in his extremities or skin rashes. Patient went to his primary care doctor today for the cough and had a chest x-ray showing probable bronchitis. He had an EKG done which showed atrial fibrillation with a rate in the 140s. His PCP contacted his cardiologist who recommended that he come directly to the emergency department for further evaluation and treatment. The onset of this condition was acute. The course is constant. Aggravating factors: none. Alleviating factors: none.    Patient is a 69 y.o. male presenting with atrial fibrillation. The history is provided by the patient and medical records.  Atrial Fibrillation Associated symptoms include coughing and fatigue. Pertinent negatives include no abdominal pain, chest pain, fever, headaches, myalgias, nausea, rash, sore throat, vomiting or weakness.    Past Medical History  Diagnosis Date  . CAD (coronary artery disease)   . Hypertension   . Hypothyroidism   . Benign prostatic hypertrophy     a. Nonobstructive on 2004 cath b. anterior STEMI s/p DES-pLAD 12/05/12  . Hyperlipidemia   . Diabetes mellitus without complication   . Stroke 2009  . GERD (gastroesophageal reflux  disease)   . Cancer     skin cancer  . History of shingles 08/2012    Lt eye shingles  . 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 Creat was 6 when he rec'd a deceased donor transplant 10/21/10 at [facility].  According to patient he did OK then in the first few weeks after surgery had to go back in hospital for "BP" problems that "messed the kidney up". Since then creatinine has been in 2.3-3.0 range.  Sees only [place] nephrologists, gets most care there.  Takes prograf and Myfortic, no steroids.  PKD kidneys were left in place.    . Chronic kidney disease (CKD), stage IV (severe) 12/08/2012    Of renal transplant, done 10/21/10 at [facility]   . Cardiac arrest 12/05/2012    In the setting of anterior STEMI  . 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 Kidney transplant      10-21-10  . Other surgical history  11/21/12    cancerous cells removed from mid upper chest  . Eye surgery    . Other surgical history N/A 11/15/2012    pt had skin cancer removed from mid upper chest  . Coronary angioplasty with stent placement  12/05/2012    30% oLAD, 20% mLAD, 95-99% pLAD s/p DES, 60% oD1, 70-80% pRamus, 40% pLCx, 50% AV groove Cx, 50% PLB, 100% mRCA s/p recanalization intra-procedurally, noted to be small non-dominant with 70% pRCA stenosis post-procedure  . Cardiac catheterization  2004  Nonobstructive  . Facial reconstruction surgery  2015    [facility]  . Left heart catheterization with coronary angiogram N/A 12/05/2012    Procedure: LEFT HEART CATHETERIZATION WITH CORONARY ANGIOGRAM;  Surgeon: Burnell Blanks, MD;  Location: [facility] CATH LAB;  Service: Cardiovascular;  Laterality: N/A;   Family History  Problem Relation Age of Onset  . Kidney failure Mother     was on dialysis 3 times a week when she died  . Alcohol abuse Mother   . Hypertension Father   . Heart attack Sister   .  Kidney disease Sister   . Aneurysm Sister   . COPD Sister   . Colon cancer Neg Hx   . Prostate cancer Neg Hx    History  Substance Use Topics  . Smoking status: Never Smoker   . Smokeless tobacco: Never Used  . Alcohol Use: No    Review of Systems  Constitutional: Positive for fatigue. Negative for fever.  HENT: Negative for rhinorrhea and sore throat.   Eyes: Negative for redness.  Respiratory: Positive for cough. Negative for shortness of breath and wheezing.   Cardiovascular: Negative for chest pain.  Gastrointestinal: Negative for nausea, vomiting, abdominal pain and diarrhea.  Genitourinary: Negative for dysuria.  Musculoskeletal: Negative for myalgias.  Skin: Negative for rash.  Neurological: Negative for syncope, weakness, light-headedness and headaches.    Allergies  Review of patient's allergies indicates no known allergies.  Home Medications   Prior to Admission medications   Medication Sig Start Date End Date Taking? Authorizing Provider  aspirin 81 MG tablet Take 81 mg by mouth daily.    Historical Provider, MD  atorvastatin (LIPITOR) 20 MG tablet Take 1 tablet (20 mg total) by mouth daily. 12/03/14   Burnell Blanks, MD  clopidogrel (PLAVIX) 75 MG tablet TAKE 1 TABLET (75 MG TOTAL) BY MOUTH DAILY. 02/10/15   Burnell Blanks, MD  fluconazole (DIFLUCAN) 200 MG tablet Take 200 mg by mouth 4 (four) times daily.    Historical Provider, MD  levothyroxine (SYNTHROID, LEVOTHROID) 125 MCG tablet Take 1 tablet (125 mcg total) by mouth daily before breakfast. 01/25/15   Tonia Ghent, MD  metoprolol tartrate (LOPRESSOR) 25 MG tablet TAKE 1 TABLET BY MOUTH 2 TIMES DAILY. MAY FILL 01/13/2014. 05/26/14   Tonia Ghent, MD  nitroGLYCERIN (NITROSTAT) 0.4 MG SL tablet Place 1 tablet (0.4 mg total) under the tongue every 5 (five) minutes x 3 doses as needed for chest pain. 12/12/12   Roger A Arguello, PA-C  predniSONE (DELTASONE) 10 MG tablet Take 1 tablet (10 mg total)  by mouth daily with breakfast. 11/13/13   Cherene Altes, MD  sertraline (ZOLOFT) 100 MG tablet Take 100 mg by mouth daily.  10/18/13   Historical Provider, MD  sodium bicarbonate 650 MG tablet Take 650 mg by mouth 2 (two) times daily.     Historical Provider, MD   BP 137/81 mmHg  Pulse 81  Temp(Src) 98.3 F (36.8 C) (Oral)  Resp 16  Ht '5\' 8"'$  (1.727 m)  Wt 165 lb (74.844 kg)  BMI 25.09 kg/m2  SpO2 98%   Physical Exam  Constitutional: He appears well-developed and well-nourished.  HENT:  Head: Normocephalic and atraumatic.  Mouth/Throat: Mucous membranes are normal. Mucous membranes are not dry.  Eyes: Conjunctivae are normal.  Neck: Trachea normal and normal range of motion. Neck supple. Normal carotid pulses and no JVD present. No muscular tenderness present. Carotid bruit is not present. No tracheal deviation present.  Cardiovascular: S1 normal, S2 normal, normal heart sounds and intact distal pulses.  An irregularly irregular rhythm present. Tachycardia present.  Exam reveals no distant heart sounds and no decreased pulses.   No murmur heard. Pulmonary/Chest: Effort normal and breath sounds normal. No respiratory distress. He has no wheezes. He exhibits no tenderness.  Abdominal: Soft. Normal aorta and bowel sounds are normal. There is no tenderness. There is no rebound and no guarding.  Musculoskeletal: He exhibits no edema.  Neurological: He is alert.  Skin: Skin is warm and dry. He is not diaphoretic. No cyanosis. No pallor.  Psychiatric: He has a normal mood and affect.  Nursing note and vitals reviewed.   ED Course  Procedures (including critical care time) Labs Review Labs Reviewed  CBC - Abnormal; Notable for the following:    WBC 11.2 (*)    All other components within normal limits  BASIC METABOLIC PANEL - Abnormal; Notable for the following:    Chloride 96 (*)    Glucose, Bld 161 (*)    BUN 38 (*)    Creatinine, Ser 2.73 (*)    GFR calc non Af Amer 22 (*)     GFR calc Af Amer 26 (*)    Anion gap 17 (*)    All other components within normal limits  TROPONIN I - Abnormal; Notable for the following:    Troponin I 0.09 (*)    All other components within normal limits  MAGNESIUM  I-STAT TROPOININ, ED    Imaging Review Dg Chest 2 View  02/17/2015   CLINICAL DATA:  Cough, shortness of breath, chest tightness  EXAM: CHEST  2 VIEW  COMPARISON:  Chest x-ray of 12/07/2012  FINDINGS: No active infiltrate or effusion is seen. Mediastinal and hilar contours are unremarkable. The heart is within normal limits in size. There is some peribronchial thickening which may indicate bronchitis. No bony abnormality is seen.  IMPRESSION: 1. Peribronchial thickening may indicate bronchitis. 2. No infiltrate or effusion is seen.   Electronically Signed   By: Ivar Drape M.D.   On: 02/17/2015 12:02     EKG Interpretation   Date/Time:  Tuesday February 17 2015 13:18:44 EDT Ventricular Rate:  142 PR Interval:    QRS Duration: 70 QT Interval:  304 QTC Calculation: 467 R Axis:   26 Text Interpretation:  Atrial fibrillation with rapid ventricular response  with premature ventricular or aberrantly conducted complexes Low voltage  QRS Cannot rule out Anterior infarct , age undetermined Abnormal ECG  Confirmed by Hazle Coca [phone]) on 02/17/2015 1:39:18 PM       1:51 PM Patient seen and examined. Work-up initiated. Medications ordered. Pt seen with Dr. Ralene Bathe.   Vital signs reviewed and are as follows: BP 137/81 mmHg  Pulse 81  Temp(Src) 98.3 F (36.8 C) (Oral)  Resp 16  Ht '5\' 8"'$  (1.727 m)  Wt 165 lb (74.844 kg)  BMI 25.09 kg/m2  SpO2 98%  3:15 PM Dr. Ralene Bathe spoke with cardiology who will see. Patient continues in afib near 140. Increased cardizem to 7.'5mg'$ /hr.   4:04 PM Pending cards consult. Handoff to [place] PA-C at shift change who will f/u on cardiology reccs.   BP 124/93 mmHg  Pulse 137  Temp(Src) 98.3 F (36.8 C) (Oral)  Resp 26  Ht '5\' 8"'$  (1.727 m)  Wt 165  lb (74.844 kg)  BMI 25.09 kg/m2  SpO2 100%    MDM   Final diagnoses:  Atrial fibrillation with RVR   Pending  cards reccs.     Carlisle Cater, PA-C 02/17/15 [place], MD 02/18/15 [phone]

## 2015-02-17 NOTE — ED Notes (Signed)
Pt will be brought to the room from xray

## 2015-02-17 NOTE — Progress Notes (Signed)
Called Wife Anne Ng to bring Difluprednate from home.

## 2015-02-17 NOTE — ED Notes (Signed)
Notified Dr. Ralene Bathe of Troponin results  0.08ng/mL

## 2015-02-17 NOTE — Progress Notes (Signed)
Pre visit review using our clinic review tool, if applicable. No additional management support is needed unless otherwise documented below in the visit note. 

## 2015-02-17 NOTE — Progress Notes (Signed)
Paged Dr. Marlowe Sax with trop of 0.12.

## 2015-02-17 NOTE — Progress Notes (Signed)
HPI  Pt presents to the clinic today with c/o cough. This started 2 days ago. The cough is not productive. He reports he is having chest tightness and shortness of breath. He has had some associated runny nose but denies ear pain or sore throat. He denies fever, chills or body aches. He has felt fatigued. He has tried 1 dose of Mucinex OTC without any relief. He reports he has not taken any of his medications today. He does have a history of CAD s/p MI. He has not breathing problems or seasonal allergies. He has not had sick contacts that he is aware of.  Review of Systems      Past Medical History  Diagnosis Date  . CAD (coronary artery disease)   . Hypertension   . Hypothyroidism   . Benign prostatic hypertrophy     a. Nonobstructive on 2004 cath b. anterior STEMI s/p DES-pLAD 12/05/12  . Hyperlipidemia   . Diabetes mellitus without complication   . Stroke 2009  . GERD (gastroesophageal reflux disease)   . Cancer     skin cancer  . History of shingles 08/2012    Lt eye shingles  . 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 Creat was 6 when he rec'd a deceased donor transplant 09-24-10 at [facility].  According to patient he did OK then in the first few weeks after surgery had to go back in hospital for "BP" problems that "messed the kidney up". Since then creatinine has been in 2.3-3.0 range.  Sees only [place] nephrologists, gets most care there.  Takes prograf and Myfortic, no steroids.  PKD kidneys were left in place.    . Chronic kidney disease (CKD), stage IV (severe) 12/08/2012    Of renal transplant, done 09/24/10 at [facility]   . Cardiac arrest 12/05/2012    In the setting of anterior STEMI  . 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 Kidney failure Mother     was on dialysis 3 times a week when she died  . Alcohol abuse Mother   . Hypertension Father   . Heart  attack Sister   . Kidney disease Sister   . Aneurysm Sister   . COPD Sister   . Colon cancer Neg Hx   . Prostate cancer Neg Hx     History   Social History  . Marital Status: Married    Spouse Name: N/A  . Number of Children: N/A  . Years of Education: N/A   Occupational History  . 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 Smoking status: Never Smoker   . Smokeless tobacco: Never Used  . Alcohol Use: No  . Drug Use: No  . Sexual Activity: Yes   Other Topics Concern  . Not on file   Social History Narrative   Running lawn care service, part time.     Married 1966    No Known Allergies   Constitutional: Positive fatigue. Denies headache, fever or abrupt weight changes.  HEENT:  Positive runny nose. Denies eye redness, eye pain, pressure behind the eyes, facial pain, nasal congestion, ear pain, ringing in the ears, wax buildup, or sore throat. Respiratory: Positive cough and shortness of  breath. Denies difficulty breathing.  Cardiovascular: Pt reports chest tightness. Denies chest pain, palpitations or swelling in the hands or feet.   No other specific complaints in a complete review of systems (except as listed in HPI above).  Objective:   BP 132/62 mmHg  Pulse 110  Temp(Src) 98.2 F (36.8 C) (Oral)  Wt 163 lb (73.936 kg)  SpO2 96% Wt Readings from Last 3 Encounters:  02/17/15 163 lb (73.936 kg)  09/19/14 170 lb (77.111 kg)  04/28/14 163 lb 12 oz (74.277 kg)     General: Appears his stated age, chronically ill appearing in NAD. HEENT: Head: normal shape and size; Eyes: he has sutures in his right upper eyelid, periorbital bruising of right eye; Ears: bilateral cerumen impaction; Nose: mucosa boggy and moist, thick green mucous noted; Throat/Mouth: + PND. Teeth present, mucosa erythematous and moist, no exudate noted, no lesions or ulcerations noted.  Neck: No lymphadenopathy.   Cardiovascular: Tachycardic with irregular rhythm. S1,S2 noted.  Pulmonary/Chest: Normal effort and diminshed vesicular breath sounds. No respiratory distress. No wheezes, rales or ronchi noted.      Assessment & Plan:   Cough and shortness of breath:  It has only been 2 days but given the way he looks, will obtain chest xrayOk to continue Mucinex until chest xray results are back  Tachycardia with chest tightness:  ECG today-  Atrial fibrillation, frequent ectopic ventricular beats  Old inferior infarct, Poor R-wave progression Nonspecific ST depression  He sees Dr. Marton Redwood, will get him an appt today, Talked with cardiologist, they want him to go to straight to the ER  RTC as needed or if symptoms persist.

## 2015-02-17 NOTE — Patient Instructions (Signed)

## 2015-02-17 NOTE — ED Notes (Signed)
Wife-Annette [phone]

## 2015-02-17 NOTE — H&P (Signed)
Patient ID: Edward Oconnell MRN: [id number], DOB/AGE: September 04, 1946   Admit date: 02/17/2015   Primary Physician: Elsie Stain, MD Primary Cardiologist: Dr. [name]  Pt. Profile:   69 year old male with past medical history of CAD s/p DES to LAD in 11/2012, h/o cardiac arrest in the setting anterior STEMI, CKD stage III s/p renal transplant on chronic prednisone, PAF not on systemic anticoagulation, hypothyroidism, HTN, HLD, and CVA present with a-fib with RVR of unknown duration  Problem List  Past Medical History  Diagnosis Date  . CAD (coronary artery disease)   . Hypertension   . Hypothyroidism   . Benign prostatic hypertrophy     a. Nonobstructive on 2004 cath b. anterior STEMI s/p DES-pLAD 12/05/12  . Hyperlipidemia   . Diabetes mellitus without complication   . Stroke 2009  . GERD (gastroesophageal reflux disease)   . Cancer     skin cancer  . History of shingles 08/2012    Lt eye shingles  . 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 Creat was 6 when he rec'd a deceased donor transplant 2010-10-07 at [facility].  According to patient he did OK then in the first few weeks after surgery had to go back in hospital for "BP" problems that "messed the kidney up". Since then creatinine has been in 2.3-3.0 range.  Sees only [place] nephrologists, gets most care there.  Takes prograf and Myfortic, no steroids.  PKD kidneys were left in place.    . Chronic kidney disease (CKD), stage IV (severe) 12/08/2012    Of renal transplant, done 10-07-2010 at [facility]   . Cardiac arrest 12/05/2012    In the setting of anterior STEMI  . Altered mental state     with cryptococcal Ag positive on lumbar puncture 2015 at [facility]    Past Surgical History  Procedure Laterality Date  . Kidney transplant      2010-10-07  . Other surgical history  11/21/12    cancerous cells removed from mid upper chest  . Eye surgery    . Other surgical history N/A 11/15/2012    pt had skin cancer removed from mid  upper chest  . Coronary angioplasty with stent placement  12/05/2012    30% oLAD, 20% mLAD, 95-99% pLAD s/p DES, 60% oD1, 70-80% pRamus, 40% pLCx, 50% AV groove Cx, 50% PLB, 100% mRCA s/p recanalization intra-procedurally, noted to be small non-dominant with 70% pRCA stenosis post-procedure  . Cardiac catheterization  2004    Nonobstructive  . Facial reconstruction surgery  2015    [facility]  . Left heart catheterization with coronary angiogram N/A 12/05/2012    Procedure: LEFT HEART CATHETERIZATION WITH CORONARY ANGIOGRAM;  Surgeon: Burnell Blanks, MD;  Location: [facility] CATH LAB;  Service: Cardiovascular;  Laterality: N/A;     Allergies  No Known Allergies  HPI  The patient is a 69 year old male with past medical history of CAD s/p DES to LAD in 11/2012, h/o cardiac arrest in the setting anterior STEMI, CKD stage III s/p renal transplant on chronic prednisone, PAF, hypothyroidism, HTN, HLD, and CVA. He was admitted to [facility] on 3/26 02/2013 with anterior STEMI complicated by ventricular fibrillation and cardiogenic shock. He had drug-eluting stent placed in LAD, there was a small nondominant RCA occluded at the beginning of the case and reperfused with medical therapy during the case. He required intra-aortic balloon pump and a pressure support post cath. He was placed on amiodarone however  this was stopped prior to discharge secondary to bradycardia. Echocardiogram during that admission showed preserved LV systolic function. 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 He was placed on event monitor in January 2015 which captured atrial fibrillation. He was subsequently started on Coumadin in Feb 2015, however had a fall and hit his head while having supratherapeutic INR in March 2015. His Coumadin was stopped and aspirin was restarted along with Plavix. The decision was made not to restart  systemic anticoagulation therapy. In March 2015, he was admitted again at [facility] for CNS fungal infection and has been on chronic Diflucan since. He was last seen by Dr. [name] in January 2016, at which time he was doing well. He has been compliant with medication as his wife enforce his compliance.  He continued to have chronic right eye problem and required to see eye doctor multiple times during the week. According to family, he had 2 recurrent shingles on the side. Otherwise he has been doing well at home he denies any recent exertional chest discomfort or shortness of breath. He denies any recent fever or chill. He started having a cough on Saturday 02/14/2015. He tried cough medication/mucinex OTC without improvement. He described as nonproductive. He eventually decided to seek medical attention with his PCP in the morning of 02/17/2015. However while at his PCPs office, EKG showed he was in atrial fibrillation with RVR. Chest x-ray showed possible bronchitis. He was transferred to [facility] for further evaluation. On arrival, his creatinine was 2.73, troponin 0.09, white blood cell count 11.2. Chest x-ray was repeated which continued to show bronchitic changes without acute infiltrate. EKG showed atrial fibrillation with poor R wave progression in anterior lead, heart rate 140s. He was started on IV diltiazem and cardiology has been consulted for A. fib with RVR.   Home Medications  Prior to Admission medications   Medication Sig Start Date End Date Taking? Authorizing Provider  aspirin 81 MG tablet Take 81 mg by mouth daily.   Yes Historical Provider, MD  atorvastatin (LIPITOR) 20 MG tablet Take 1 tablet (20 mg total) by mouth daily. 12/03/14  Yes Burnell Blanks, MD  clopidogrel (PLAVIX) 75 MG tablet TAKE 1 TABLET (75 MG TOTAL) BY MOUTH DAILY. 02/10/15  Yes Burnell Blanks, MD  COMBIGAN 0.2-0.5 % ophthalmic solution Place 1 drop into the right eye 2 (two) times  daily. 11/15/14  Yes Historical Provider, MD  DUREZOL 0.05 % EMUL Place 1 drop into the right eye daily.  01/05/15  Yes Historical Provider, MD  fluconazole (DIFLUCAN) 200 MG tablet Take 200 mg by mouth 4 (four) times daily.   Yes Historical Provider, MD  levothyroxine (SYNTHROID, LEVOTHROID) 125 MCG tablet Take 1 tablet (125 mcg total) by mouth daily before breakfast. 01/25/15  Yes Tonia Ghent, MD  metoprolol tartrate (LOPRESSOR) 25 MG tablet TAKE 1 TABLET BY MOUTH 2 TIMES DAILY. MAY FILL 01/13/2014. 05/26/14  Yes Tonia Ghent, MD  nitroGLYCERIN (NITROSTAT) 0.4 MG SL tablet Place 1 tablet (0.4 mg total) under the tongue every 5 (five) minutes x 3 doses as needed for chest pain. 12/12/12  Yes Roger A Arguello, PA-C  predniSONE (DELTASONE) 10 MG tablet Take 1 tablet (10 mg total) by mouth daily with breakfast. 11/13/13  Yes Cherene Altes, MD  pseudoephedrine-guaifenesin [facility] D) 60-600 MG per tablet Take 1 tablet by mouth at bedtime as needed  for congestion.   Yes Historical Provider, MD  sertraline (ZOLOFT) 100 MG tablet Take 100 mg by mouth daily.  10/18/13  Yes Historical Provider, MD  sodium bicarbonate 650 MG tablet Take 650 mg by mouth 2 (two) times daily.    Yes Historical Provider, MD  VIGAMOX 0.5 % ophthalmic solution Place 1 drop into the right eye as directed. 8 times a day 01/13/15   Historical Provider, MD    Family History  Family History  Problem Relation Age of Onset  . Kidney failure Mother     was on dialysis 3 times a week when she died  . Alcohol abuse Mother   . Hypertension Father   . Heart attack Sister   . Kidney disease Sister   . Aneurysm Sister   . COPD Sister   . Colon cancer Neg Hx   . Prostate cancer Neg Hx     Social History  History   Social History  . Marital Status: Married    Spouse Name: N/A  . Number of Children: N/A  . Years of Education: N/A   Occupational History  . 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 Smoking status: Never Smoker   . Smokeless tobacco: Never Used  . Alcohol Use: No  . Drug Use: No  . Sexual Activity: Yes   Other Topics Concern  . Not on file   Social History Narrative   Running lawn care service, part time.     Married 1966     Review of Systems General:  No chills, fever, night sweats or weight changes.  Cardiovascular:  No chest pain, dyspnea on exertion, edema, orthopnea, palpitations, paroxysmal nocturnal dyspnea. Dermatological: No rash, lesions/masses Respiratory: No dyspnea +cough Urologic: No hematuria, dysuria Abdominal:   No nausea, vomiting, diarrhea, bright red blood per rectum, melena, or hematemesis Neurologic:  No visual changes, wkns, changes in mental status. All other systems reviewed and are otherwise negative except as noted above.  Physical Exam  Blood pressure 124/93, pulse 137, temperature 98.3 F (36.8 C), temperature source Oral, resp. rate 26, height '5\' 8"'$  (1.727 m), weight 165 lb (74.844 kg), SpO2 100 %.  General: Pleasant, NAD Psych: Normal affect. Neuro: Alert and oriented X 3. Moves all extremities spontaneously. HEENT: Normal  Neck: Supple without bruits or JVD. Lungs:  Resp regular and unlabored, CTA. Heart: tachycardic, irregular. no s3, s4, or murmurs. Abdomen: Soft, non-tender, non-distended, BS + x 4.  Extremities: No clubbing, cyanosis or edema. DP/PT/Radials 2+ and equal bilaterally.  Labs  Troponin [facility] of Care Test)  Recent Labs  02/17/15 1347  TROPIPOC 0.08    Recent Labs  02/17/15 1330  TROPONINI 0.09*   Lab Results  Component Value Date   WBC 11.2* 02/17/2015   HGB 14.0 02/17/2015   HCT 43.2 02/17/2015   MCV 83.9 02/17/2015   PLT 166 02/17/2015     Recent Labs Lab 02/17/15 1330  NA 136  K 4.1  CL 96*  CO2 23  BUN 38*  CREATININE 2.73*  CALCIUM 9.4  GLUCOSE 161*   Lab Results  Component Value Date   CHOL 217* 01/21/2015    HDL 43.50 01/21/2015   LDLCALC 83 11/12/2013   TRIG 379.0* 01/21/2015   No results found for: DDIMER   Radiology/Studies  Dg Chest 2 View  02/17/2015   CLINICAL DATA:  Atrial  fibrillation, fatigue, shortness of breath, cough for 3 days.  EXAM: CHEST  2 VIEW  COMPARISON:  02/17/2015  FINDINGS: Mild peribronchial thickening as seen earlier today. No confluent opacities. Heart is normal size. Coronary stents noted. No effusions. No acute bony abnormality.  Nodular density projects over the right lung base. This presumably represents nipple shadow, but could be confirmed with nipple markers.  IMPRESSION: Mild bronchitic changes.  Nodular density projects over the right lung base, likely nipple shadow. This could be confirmed with repeat PA view with nipple marker.   Electronically Signed   By: Rolm Baptise M.D.   On: 02/17/2015 13:48   Dg Chest 2 View  02/17/2015   CLINICAL DATA:  Cough, shortness of breath, chest tightness  EXAM: CHEST  2 VIEW  COMPARISON:  Chest x-ray of 12/07/2012  FINDINGS: No active infiltrate or effusion is seen. Mediastinal and hilar contours are unremarkable. The heart is within normal limits in size. There is some peribronchial thickening which may indicate bronchitis. No bony abnormality is seen.  IMPRESSION: 1. Peribronchial thickening may indicate bronchitis. 2. No infiltrate or effusion is seen.   Electronically Signed   By: Ivar Drape M.D.   On: 02/17/2015 12:02    ECG  A-fib with RVR  Echocardiogram 12/06/2012  LV EF: 60% -  65%  ------------------------------------------------------------ Indications:   MI - acute 410.91.  ------------------------------------------------------------ History:  PMH:  Coronary artery disease. Stroke. Risk factors: Hypertension. Diabetes mellitus.  ------------------------------------------------------------ Study Conclusions  - Left ventricle: The cavity size was normal. Wall thickness was increased in a pattern of  mild LVH. Systolic function was normal. The estimated ejection fraction was in the range of 60% to 65%. Wall motion was normal; there were no regional wall motion abnormalities. - Right ventricle: The cavity size was mildly dilated. Systolic function was mildly reduced.    ASSESSMENT AND PLAN  1. Proxysmal A-fib with RVR - unknown duration due to lack of cardiac awareness  - unclear cause, no sign of HF on exam. Cannot r/o bronchitis as a trigger  - CHA2DS2-Vasc score 5 (age, CVA, HTN, DM), not on systemic anticoagulation due to inability to control INR, easy bruising and multiple complex issue  - continue IV diltiazem, since cannot take systemic anticoagulation, would not cardiovert and focus on rate control only.  - obtain echocardiogram. Trend trop, elevated trop likely demand ischemia in the setting of afib RVR  2. Possible bronchitis  3. 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 CAD s/p DES to LAD in 11/2012  - anterior STEMI in 10/9474, complicated by cardiac arrest required short period of IABP and pressor. Small non-dominant RCA was occluded at beginning of case and reperfused with medical therapy during case. Moderate disease in intermediate branch which we have managed medically.  5. CKD stage III s/p renal transplant on chronic prednisone 6. Hypothyroidism 7. HTN 8. HLD 9. CVA  Signed, Woodward Ku 02/17/2015, 4:59 PM  Agree with note by Almyra Deforest PA-C  Pt admitted with AFIB with RVR, mildly elevated trop. No CP/SOB. Dry non productive cough, slightly elevated WBC, afeb, bronchitic changes on CXR. Agree with IV dilt for rate control with conversion to PO. Not an anti coag  candidate therefore wouldn't cardiovert. No ATBX since afeb. Repeat 2D   Lorretta Harp, M.D., [place], [facility] [place], [place] [address] [place], [place]  [id number]  [phone] 02/17/2015 6:20 PM

## 2015-02-18 ENCOUNTER — Other Ambulatory Visit: Payer: Self-pay

## 2015-02-18 LAB — BASIC METABOLIC PANEL
Anion gap: 14 (ref 5–15)
BUN: 40 mg/dL — AB (ref 6–20)
CALCIUM: 9 mg/dL (ref 8.9–10.3)
CO2: 22 mmol/L (ref 22–32)
Chloride: 98 mmol/L — ABNORMAL LOW (ref 101–111)
Creatinine, Ser: 2.47 mg/dL — ABNORMAL HIGH (ref 0.61–1.24)
GFR calc Af Amer: 29 mL/min — ABNORMAL LOW (ref >60)
GFR calc non Af Amer: 25 mL/min — ABNORMAL LOW (ref >60)
Glucose, Bld: 134 mg/dL — ABNORMAL HIGH (ref 65–99)
Potassium: 4.9 mmol/L (ref 3.5–5.1)
Sodium: 134 mmol/L — ABNORMAL LOW (ref 135–145)

## 2015-02-18 LAB — URINALYSIS, ROUTINE W REFLEX MICROSCOPIC
GLUCOSE, UA: NEGATIVE mg/dL
HGB URINE DIPSTICK: NEGATIVE
Ketones, ur: 15 mg/dL — AB
Leukocytes, UA: NEGATIVE
Nitrite: NEGATIVE
PROTEIN: 30 mg/dL — AB
Specific Gravity, Urine: 1.016 (ref 1.005–1.030)
Urobilinogen, UA: 0.2 mg/dL (ref 0.0–1.0)
pH: 6 (ref 5.0–8.0)

## 2015-02-18 LAB — URINE MICROSCOPIC-ADD ON

## 2015-02-18 LAB — LIPID PANEL
CHOLESTEROL: 154 mg/dL (ref 0–200)
HDL: 33 mg/dL — AB (ref >40)
LDL Cholesterol: 79 mg/dL (ref 0–99)
TRIGLYCERIDES: 209 mg/dL — AB (ref <150)
Total CHOL/HDL Ratio: 4.7 RATIO
VLDL: 42 mg/dL — AB (ref 0–40)

## 2015-02-18 LAB — TROPONIN I
TROPONIN I: 0.15 ng/mL — AB (ref <0.031)
Troponin I: 0.16 ng/mL — ABNORMAL HIGH (ref <0.031)

## 2015-02-18 MED ORDER — OFF THE BEAT BOOK
Freq: Once | Status: AC
  Administered 2015-02-18: 10:00:00
  Filled 2015-02-18: qty 1

## 2015-02-18 MED ORDER — METOPROLOL TARTRATE 50 MG PO TABS
50.0000 mg | ORAL_TABLET | Freq: Two times a day (BID) | ORAL | Status: DC
  Administered 2015-02-18: 50 mg via ORAL
  Filled 2015-02-18: qty 1

## 2015-02-18 MED ORDER — NEPAFENAC 0.3 % OP SUSP
1.0000 [drp] | Freq: Two times a day (BID) | OPHTHALMIC | Status: DC
  Administered 2015-02-18 – 2015-02-20 (×5): 1 [drp] via OPHTHALMIC

## 2015-02-18 NOTE — Evaluation (Signed)
Occupational Therapy Evaluation Patient Details Name: Edward Oconnell MRN: [id number] DOB: Aug 18, 1946 Today's Date: 02/18/2015    History of Present Illness Pt is a74 year old male with a PMH of CAD s/p DES to LAD in 11/2012, h/o cardiac arrest in the setting anterior STEMI, CKD stage III s/p renal transplant on chronic prednisone, PAF not on systemic anticoagulation, hypothyroidism, HTN, HLD, and CVA. Pt presents with a-fib with RVR of unknown duration.   Clinical Impression   Pt admitted with above. Education provided in session. Signing off (pt/wife agreeable with this) on pt as wife will be home to assist as needed.     Follow Up Recommendations  No OT follow up;Supervision - Intermittent    Equipment Recommendations  None recommended by OT    Recommendations for Other Services       Precautions / Restrictions Precautions Precautions: Fall Restrictions Weight Bearing Restrictions: No      Mobility Bed Mobility     General bed mobility comments: not assessed  Transfers Overall transfer level: Needs assistance Transfers: Sit to/from Stand Sit to Stand: Supervision            Balance Min guard for ambulation.                          ADL Overall ADL's : Needs assistance/impaired                     Lower Body Dressing: Sit to/from stand;Set up;Supervision/safety   Toilet Transfer: Min guard;Ambulation (chair)       Tub/ Shower Transfer: Tub transfer;Min guard;Ambulation   Functional mobility during ADLs: Min guard General ADL Comments: Educated on safety such as sitting for most of LB ADLs, safe footwear, recommended wife be with him for tub transfer, rugs/items on floor, and also recommended not getting down in tub for now. Recommended pt use chair for LB bathing and discussed options for shower chair. Wife reports pt will be sponge bathing initially.      Vision  Pt with poor vision in right eye-reports no recent change from baseline;  pt has had issues with right eye for a while.   Perception     Praxis      Pertinent Vitals/Pain Pain Assessment: No/denies pain; RR up to around 30 but trended down.     Hand Dominance     Extremity/Trunk Assessment Upper Extremity Assessment Upper Extremity Assessment: Overall WFL for tasks assessed (very slight weakness with bilateral shoulder flexion)   Lower Extremity Assessment Lower Extremity Assessment: Defer to PT evaluation   Cervical / Trunk Assessment Cervical / Trunk Assessment: Normal   Communication Communication Communication: No difficulties   Cognition Arousal/Alertness: Awake/alert Behavior During Therapy: WFL for tasks assessed/performed Overall Cognitive Status: Within Functional Limits for tasks assessed                     General Comments       Exercises       Shoulder Instructions      Home Living Family/patient expects to be discharged to:: Private residence Living Arrangements: Spouse/significant other Available Help at Discharge: Family;Available 24 hours/day Type of Home: House Home Access: Stairs to enter     Home Layout: One level     Bathroom Shower/Tub: Teacher, early years/pre: Handicapped height (wall close)     Home Equipment: Grab bars - tub/shower (may have walker?)  Prior Functioning/Environment Level of Independence: Independent        Comments: Works as a Paramedic Diagnosis: Generalized weakness   OT Problem List:     OT Treatment/Interventions:      OT Goals(Current goals can be found in the care plan section)   OT Frequency:     Barriers to D/C:            Co-evaluation              End of Session Equipment Utilized During Treatment: Gait belt  Activity Tolerance: Patient tolerated treatment well Patient left: in chair;with call bell/phone within reach;with family/visitor present   Time: 6237-6283 OT Time Calculation (min): 17 min Charges:  OT  General Charges $OT Visit: 1 Procedure OT Evaluation $Initial OT Evaluation Tier I: 1 Procedure G-CodesBenito Mccreedy OTR/L [id number] 02/18/2015, 4:52 PM

## 2015-02-18 NOTE — Progress Notes (Signed)
Spoke with Dr. Marlowe Sax, appears to be in Sinus HR in 70s, EKG done. Orders to stop Cardizem drip for now.

## 2015-02-18 NOTE — Progress Notes (Signed)
Spoke with Dr. Marlowe Sax, HR 90s Atrial Flutter although monitor reads as Sinus. Orders to still keep Cardizem off at this time. Will continue to monitor.

## 2015-02-18 NOTE — Progress Notes (Signed)
Subjective:  Denies SSCP, palpitations or Dyspnea Less lethargic converted to NSR   Objective:  Filed Vitals:   02/18/15 0020 02/18/15 0140 02/18/15 0240 02/18/15 0440  BP: 119/65  125/67 122/74  Pulse: 76  90 84  Temp: 100.8 F (38.2 C) 98.7 F (37.1 C)  98 F (36.7 C)  TempSrc: Axillary Oral  Oral  Resp: '23  24 19  '$ Height:      Weight:      SpO2: 95%  94% 95%    Intake/Output from previous day:  Intake/Output Summary (Last 24 hours) at 02/18/15 1112 Last data filed at 02/18/15 0854  Gross per 24 hour  Intake    600 ml  Output    250 ml  Net    350 ml    Physical Exam: Affect appropriate Chronically ill white male  HEENT: normal Neck supple with no adenopathy JVP normal no bruits no thyromegaly Lungs clear with no wheezing and good diaphragmatic motion Heart:  S1/S2 no murmur, no rub, gallop or click PMI normal Abdomen: benighn, transplanted kidney LLQ no bruit.  No HSM or HJR Distal pulses intact with no bruits No edema Neuro non-focal Skin bruising in arms and legs  No muscular weakness  Lab Results: Basic Metabolic Panel:  Recent Labs  02/17/15 1330 02/18/15 0829  NA 136 134*  K 4.1 4.9  CL 96* 98*  CO2 23 22  GLUCOSE 161* 134*  BUN 38* 40*  CREATININE 2.73* 2.47*  CALCIUM 9.4 9.0  MG 1.9  --    CBC:  Recent Labs  02/17/15 1330  WBC 11.2*  HGB 14.0  HCT 43.2  MCV 83.9  PLT 166   Cardiac Enzymes:  Recent Labs  02/17/15 2100 02/18/15 0135 02/18/15 0829  TROPONINI 0.12* 0.16* 0.15*   BNP: Invalid input(s): POCBNP D-Dimer: No results for input(s): DDIMER in the last 72 hours. Hemoglobin A1C: No results for input(s): HGBA1C in the last 72 hours. Fasting Lipid Panel:  Recent Labs  02/18/15 0135  CHOL 154  HDL 33*  LDLCALC 79  TRIG 209*  CHOLHDL 4.7    Imaging: Dg Chest 2 View  02/17/2015   CLINICAL DATA:  Atrial fibrillation, fatigue, shortness of breath, cough for 3 days.  EXAM: CHEST  2 VIEW  COMPARISON:   02/17/2015  FINDINGS: Mild peribronchial thickening as seen earlier today. No confluent opacities. Heart is normal size. Coronary stents noted. No effusions. No acute bony abnormality.  Nodular density projects over the right lung base. This presumably represents nipple shadow, but could be confirmed with nipple markers.  IMPRESSION: Mild bronchitic changes.  Nodular density projects over the right lung base, likely nipple shadow. This could be confirmed with repeat PA view with nipple marker.   Electronically Signed   By: Rolm Baptise M.D.   On: 02/17/2015 13:48   Dg Chest 2 View  02/17/2015   CLINICAL DATA:  Cough, shortness of breath, chest tightness  EXAM: CHEST  2 VIEW  COMPARISON:  Chest x-ray of 12/07/2012  FINDINGS: No active infiltrate or effusion is seen. Mediastinal and hilar contours are unremarkable. The heart is within normal limits in size. There is some peribronchial thickening which may indicate bronchitis. No bony abnormality is seen.  IMPRESSION: 1. Peribronchial thickening may indicate bronchitis. 2. No infiltrate or effusion is seen.   Electronically Signed   By: Ivar Drape M.D.   On: 02/17/2015 12:02    Cardiac Studies:  ECG:    Telemetry:  NSR  02/18/2015   Echo: 12/06/12  EF 65% mild RV enlargement   Medications:   . aspirin EC  81 mg Oral Daily  . atorvastatin  10 mg Oral q1800  . brimonidine  1 drop Right Eye BID   And  . timolol  1 drop Right Eye BID  . clopidogrel  75 mg Oral Daily  . Difluprednate  1 drop Right Eye Daily  . fluconazole  100 mg Oral BID  . heparin subcutaneous  5,000 Units Subcutaneous 3 times per day  . hydrALAZINE  50 mg Oral TID  . levothyroxine  187.5 mcg Oral QAC breakfast  . magnesium oxide  400 mg Oral TID  . metoprolol tartrate  25 mg Oral BID  . pantoprazole  80 mg Oral Q1200  . predniSONE  10 mg Oral Q breakfast  . sertraline  100 mg Oral Daily  . sodium bicarbonate  650 mg Oral TID     . diltiazem (CARDIZEM) infusion Stopped  (02/18/15 0030)    Assessment/Plan:  PAF:  Converted to NSR no anticoagulation per Dr Gennaro Africa  Increase lopressor and d/c iv cardizem D/C in am if stable  URI:  Afebrile no signs of bacterial infection.  D/c foley check UA  CXR ok  CRF:  Post transplant with Cr around 2 sees renal at [facility]  Continue prednisone  Jenkins Rouge 02/18/2015, 11:12 AM

## 2015-02-18 NOTE — Progress Notes (Signed)
Paged Dr. Marlowe Sax with trop result of 0.16

## 2015-02-18 NOTE — Progress Notes (Signed)
Utilization review complete. Senica Crall RN CCM Case Mgmt phone [phone] 

## 2015-02-18 NOTE — Evaluation (Signed)
Physical Therapy Evaluation Patient Details Name: Edward Oconnell MRN: [id number] DOB: [birth year] Today's Date: 02/18/2015   History of Present Illness  Pt is a13 year old male with a PMH of CAD s/p DES to LAD in 11/2012, h/o cardiac arrest in the setting anterior STEMI, CKD stage III s/p renal transplant on chronic prednisone, PAF not on systemic anticoagulation, hypothyroidism, HTN, HLD, and CVA. Pt presents with a-fib with RVR of unknown duration.  Clinical Impression  Pt admitted with above diagnosis. Pt currently with functional limitations due to the deficits listed below (see PT Problem List). At the time of PT eval pt was able to perform transfers and ambulation with min guard assist for safety. Pt generally flat and lethargic throughout session. Anticipate that he will progress well functionally as he feels better. Pt will benefit from skilled PT to increase their independence and safety with mobility to allow discharge to the venue listed below.       Follow Up Recommendations No PT follow up    Equipment Recommendations  Rolling walker with 5" wheels (dependent on progress with PT)    Recommendations for Other Services       Precautions / Restrictions Precautions Precautions: Fall Restrictions Weight Bearing Restrictions: No      Mobility  Bed Mobility Overal bed mobility: Needs Assistance Bed Mobility: Supine to Sit     Supine to sit: Min guard     General bed mobility comments: Close guard for safety to transition to EOB.   Transfers Overall transfer level: Needs assistance Equipment used: Rolling walker (2 wheeled) Transfers: Sit to/from Stand Sit to Stand: Min guard         General transfer comment: Hands-on support to power-up to full standing position. Increased time to gain and maintain balance, however no physical assist required.   Ambulation/Gait Ambulation/Gait assistance: Min guard Ambulation Distance (Feet): 150 Feet Assistive device: Rolling  walker (2 wheeled) Gait Pattern/deviations: Step-through pattern;Decreased stride length;Trunk flexed Gait velocity: Decreased Gait velocity interpretation: Below normal speed for age/gender General Gait Details: Pt was able to ambulate fairly well with the RW for support. Pt required cueing for general safety awareness and obstacle negotiation.   Stairs            Wheelchair Mobility    Modified Rankin (Stroke Patients Only)       Balance Overall balance assessment: Needs assistance Sitting-balance support: Feet supported;No upper extremity supported Sitting balance-Leahy Scale: Fair     Standing balance support: Bilateral upper extremity supported Standing balance-Leahy Scale: Fair Standing balance comment: Feel pt can statically remain balanced however for dynamic activity may require UE support                             Pertinent Vitals/Pain Pain Assessment: No/denies pain    Home Living Family/patient expects to be discharged to:: Private residence Living Arrangements: Spouse/significant other Available Help at Discharge: Family;Available 24 hours/day Type of Home: House Home Access: Stairs to enter     Home Layout: One level Home Equipment: None      Prior Function Level of Independence: Independent         Comments: Works as a Retail buyer        Extremity/Trunk Assessment   Upper Extremity Assessment: Defer to OT evaluation           Lower Extremity Assessment: Overall WFL for tasks assessed (Decreased strength compared to pt's baseline)  Cervical / Trunk Assessment: Normal  Communication   Communication: No difficulties  Cognition Arousal/Alertness: Lethargic Behavior During Therapy: WFL for tasks assessed/performed Overall Cognitive Status: Within Functional Limits for tasks assessed                      General Comments      Exercises        Assessment/Plan    PT Assessment  Patient needs continued PT services  PT Diagnosis Difficulty walking   PT Problem List Decreased strength;Decreased activity tolerance;Decreased range of motion;Decreased balance;Decreased mobility;Decreased knowledge of use of DME;Decreased safety awareness;Decreased knowledge of precautions  PT Treatment Interventions Gait training;DME instruction;Stair training;Functional mobility training;Therapeutic activities;Therapeutic exercise;Neuromuscular re-education;Patient/family education   PT Goals (Current goals can be found in the Care Plan section) Acute Rehab PT Goals Patient Stated Goal: Feel better PT Goal Formulation: With patient/family Time For Goal Achievement: 02/25/15 Potential to Achieve Goals: Good    Frequency Min 3X/week   Barriers to discharge        Co-evaluation               End of Session Equipment Utilized During Treatment: Gait belt Activity Tolerance: Patient tolerated treatment well Patient left: in chair;with call bell/phone within reach;with family/visitor present Nurse Communication: Mobility status         Time: 9147-8295 PT Time Calculation (min) (ACUTE ONLY): 19 min   Charges:   PT Evaluation $Initial PT Evaluation Tier I: 1 Procedure     PT G CodesRolinda Roan 02/20/15, 2:16 PM   Rolinda Roan, PT, DPT Acute Rehabilitation Services Pager: [phone]

## 2015-02-19 ENCOUNTER — Other Ambulatory Visit: Payer: Self-pay

## 2015-02-19 LAB — BASIC METABOLIC PANEL
ANION GAP: 15 (ref 5–15)
BUN: 41 mg/dL — ABNORMAL HIGH (ref 6–20)
CALCIUM: 8.9 mg/dL (ref 8.9–10.3)
CO2: 19 mmol/L — AB (ref 22–32)
Chloride: 99 mmol/L — ABNORMAL LOW (ref 101–111)
Creatinine, Ser: 2.19 mg/dL — ABNORMAL HIGH (ref 0.61–1.24)
GFR, EST AFRICAN AMERICAN: 34 mL/min — AB (ref >60)
GFR, EST NON AFRICAN AMERICAN: 29 mL/min — AB (ref >60)
Glucose, Bld: 129 mg/dL — ABNORMAL HIGH (ref 65–99)
Potassium: 3.8 mmol/L (ref 3.5–5.1)
Sodium: 133 mmol/L — ABNORMAL LOW (ref 135–145)

## 2015-02-19 LAB — CBC WITH DIFFERENTIAL/PLATELET
Basophils Absolute: 0 10*3/uL (ref 0.0–0.1)
Basophils Relative: 0 % (ref 0–1)
Eosinophils Absolute: 0 10*3/uL (ref 0.0–0.7)
Eosinophils Relative: 0 % (ref 0–5)
HCT: 37.5 % — ABNORMAL LOW (ref 39.0–52.0)
Hemoglobin: 12.7 g/dL — ABNORMAL LOW (ref 13.0–17.0)
LYMPHS ABS: 0.4 10*3/uL — AB (ref 0.7–4.0)
Lymphocytes Relative: 5 % — ABNORMAL LOW (ref 12–46)
MCH: 27.5 pg (ref 26.0–34.0)
MCHC: 33.9 g/dL (ref 30.0–36.0)
MCV: 81.2 fL (ref 78.0–100.0)
Monocytes Absolute: 0.8 10*3/uL (ref 0.1–1.0)
Monocytes Relative: 10 % (ref 3–12)
Neutro Abs: 6.6 10*3/uL (ref 1.7–7.7)
Neutrophils Relative %: 85 % — ABNORMAL HIGH (ref 43–77)
Platelets: 179 10*3/uL (ref 150–400)
RBC: 4.62 MIL/uL (ref 4.22–5.81)
RDW: 15.5 % (ref 11.5–15.5)
WBC: 7.9 10*3/uL (ref 4.0–10.5)

## 2015-02-19 MED ORDER — DILTIAZEM HCL 30 MG PO TABS
30.0000 mg | ORAL_TABLET | Freq: Four times a day (QID) | ORAL | Status: DC
  Administered 2015-02-19 – 2015-02-20 (×5): 30 mg via ORAL
  Filled 2015-02-19 (×5): qty 1

## 2015-02-19 MED ORDER — METOPROLOL TARTRATE 100 MG PO TABS
100.0000 mg | ORAL_TABLET | Freq: Two times a day (BID) | ORAL | Status: DC
  Administered 2015-02-19 – 2015-02-20 (×3): 100 mg via ORAL
  Filled 2015-02-19 (×4): qty 1

## 2015-02-19 NOTE — Progress Notes (Signed)
Patient went back into Afib HR 100s, Spoke with Dr. Clayborne Artist with Cardiology. VS stable. Patient not in distress. Will continue to monitor, no further orders at this time.

## 2015-02-19 NOTE — Progress Notes (Signed)
HR 120s-130s, paged Dr. Clayborne Artist. Orders to give '100mg'$  Metoprolol PO now.

## 2015-02-19 NOTE — Progress Notes (Signed)
Patient Name: Edward Oconnell Date of Encounter: 02/19/2015  Active Problems:   Atrial fibrillation  SUBJECTIVE  Denies chest pain, palpitation or sob. Feels tired and sleepy.   CURRENT MEDS . aspirin EC  81 mg Oral Daily  . atorvastatin  10 mg Oral q1800  . brimonidine  1 drop Right Eye BID   And  . timolol  1 drop Right Eye BID  . clopidogrel  75 mg Oral Daily  . Difluprednate  1 drop Right Eye Daily  . fluconazole  100 mg Oral BID  . heparin subcutaneous  5,000 Units Subcutaneous 3 times per day  . hydrALAZINE  50 mg Oral TID  . levothyroxine  187.5 mcg Oral QAC breakfast  . magnesium oxide  400 mg Oral TID  . metoprolol tartrate  100 mg Oral BID  . nepafenac  1 drop Right Eye BID  . pantoprazole  80 mg Oral Q1200  . predniSONE  10 mg Oral Q breakfast  . sertraline  100 mg Oral Daily  . sodium bicarbonate  650 mg Oral TID    OBJECTIVE  Filed Vitals:   02/18/15 1934 02/19/15 0048 02/19/15 0415 02/19/15 0530  BP: 137/70 122/85 123/68   Pulse: 75  123 111  Temp: 98.2 F (36.8 C)  99.4 F (37.4 C)   TempSrc: Oral  Oral   Resp: 21  20   Height:      Weight:      SpO2: 96%  96%     Intake/Output Summary (Last 24 hours) at 02/19/15 0743 Last data filed at 02/19/15 0419  Gross per 24 hour  Intake    642 ml  Output   1225 ml  Net   -583 ml   Filed Weights   02/17/15 1313 02/17/15 1945  Weight: 165 lb (74.844 kg) 160 lb 9.6 oz (72.848 kg)    PHYSICAL EXAM  General: chronically ill appearing male in  NAD. Neuro: Alert and oriented X 3. Moves all extremities spontaneously. Psych: Normal affect. HEENT:  R eye discharge.   Neck: Supple without bruits or JVD. Lungs:  Resp regular and unlabored, CTA. Heart: irregular with tachycardia. No s3, s4, or murmurs. Abdomen: Soft, non-tender, non-distended, BS + x 4.  Extremities: No clubbing, cyanosis or edema. DP/PT/Radials 2+ and equal bilaterally.  Accessory Clinical Findings  CBC  Recent Labs  02/17/15 1330   WBC 11.2*  HGB 14.0  HCT 43.2  MCV 83.9  PLT 761   Basic Metabolic Panel  Recent Labs  02/17/15 1330 02/18/15 0829  NA 136 134*  K 4.1 4.9  CL 96* 98*  CO2 23 22  GLUCOSE 161* 134*  BUN 38* 40*  CREATININE 2.73* 2.47*  CALCIUM 9.4 9.0  MG 1.9  --     Cardiac Enzymes  Recent Labs  02/17/15 2100 02/18/15 0135 02/18/15 0829  TROPONINI 0.12* 0.16* 0.15*   Fasting Lipid Panel  Recent Labs  02/18/15 0135  CHOL 154  HDL 33*  LDLCALC 79  TRIG 209*  CHOLHDL 4.7   Thyroid Function Tests  TELE  afib with rate of 10x to 120s. Occasional PVCs.   Radiology/Studies  Dg Chest 2 View  02/17/2015   CLINICAL DATA:  Atrial fibrillation, fatigue, shortness of breath, cough for 3 days.  EXAM: CHEST  2 VIEW  COMPARISON:  02/17/2015  FINDINGS: Mild peribronchial thickening as seen earlier today. No confluent opacities. Heart is normal size. Coronary stents noted. No effusions. No acute bony abnormality.  Nodular  density projects over the right lung base. This presumably represents nipple shadow, but could be confirmed with nipple markers.  IMPRESSION: Mild bronchitic changes.  Nodular density projects over the right lung base, likely nipple shadow. This could be confirmed with repeat PA view with nipple marker.   Electronically Signed   By: Rolm Baptise M.D.   On: 02/17/2015 13:48   Dg Chest 2 View  02/17/2015   CLINICAL DATA:  Cough, shortness of breath, chest tightness  EXAM: CHEST  2 VIEW  COMPARISON:  Chest x-ray of 12/07/2012  FINDINGS: No active infiltrate or effusion is seen. Mediastinal and hilar contours are unremarkable. The heart is within normal limits in size. There is some peribronchial thickening which may indicate bronchitis. No bony abnormality is seen.  IMPRESSION: 1. Peribronchial thickening may indicate bronchitis. 2. No infiltrate or effusion is seen.   Electronically Signed   By: Ivar Drape M.D.   On: 02/17/2015 12:02    ASSESSMENT AND PLAN 69 year old male  with past medical history of CAD s/p DES to LAD in 11/2012, h/o cardiac arrest in the setting anterior STEMI, CKD stage III s/p renal transplant on chronic prednisone, PAF not on systemic anticoagulation, hypothyroidism, HTN, HLD, and CVA present with a-fib with RVR of unknown duration .  VXY:IAXK in afib 10:50pm last night. Metoprolol increased to '100mg'$  BID early morning.  Rate still in 110s to 1120s. Will start diltiazem '30mg'$  q 6 hours. Plan to change to dilt CD tomorrow morning if rate is well controlled, if not increased to '60mg'$  q 6hrs. No anticoagulation per Dr Gennaro Africa.  URI: UA CXR ok.   CRF: Post transplant with Cr around 2. Cr improved to 2.47 from 2.73. Sees renal at [facility]. Continue prednisone.  CAD: s/p DES to LAD in 11/2012. Flat trop trend 0.09->0.12->0.16->0.15. Likely demand. Asymptomatic. Continue ASA, plavix, statin, BB.   Jarrett Soho PA-C Pager [phone]  Patient examined chart reviewed Still with PAF Agree with med changes above.  Exam remarkable for scar over right maxillary area form shingles/ blind right eye No angina CAD stable  CR stable post transplant with kidney in LLQ  [facility]

## 2015-02-20 ENCOUNTER — Telehealth: Payer: Self-pay | Admitting: Nurse Practitioner

## 2015-02-20 ENCOUNTER — Telehealth: Payer: Self-pay | Admitting: *Deleted

## 2015-02-20 MED ORDER — DILTIAZEM HCL ER COATED BEADS 120 MG PO CP24: 120.0000 mg | ORAL_CAPSULE | Freq: Every day | ORAL | Status: DC

## 2015-02-20 MED ORDER — DILTIAZEM HCL ER COATED BEADS 120 MG PO CP24
120.0000 mg | ORAL_CAPSULE | Freq: Every day | ORAL | Status: DC
Start: 2015-02-20 — End: 2015-02-20
  Administered 2015-02-20: 120 mg via ORAL
  Filled 2015-02-20: qty 1

## 2015-02-20 MED ORDER — PANTOPRAZOLE SODIUM 40 MG PO TBEC: 40.0000 mg | DELAYED_RELEASE_TABLET | Freq: Every day | ORAL | Status: DC

## 2015-02-20 MED ORDER — METOPROLOL TARTRATE 100 MG PO TABS: 100.0000 mg | ORAL_TABLET | Freq: Two times a day (BID) | ORAL | Status: DC

## 2015-02-20 MED ORDER — OFF THE BEAT BOOK
Freq: Once | Status: DC
  Filled 2015-02-20: qty 1

## 2015-02-20 NOTE — Telephone Encounter (Signed)
Agreed.  Thanks.  

## 2015-02-20 NOTE — Progress Notes (Signed)
Patient Name: Edward Oconnell Date of Encounter: 02/20/2015  Active Problems:   Atrial fibrillation  SUBJECTIVE  Denies chest pain, palpitation or sob. Feels better today. Occasional cough.   CURRENT MEDS . aspirin EC  81 mg Oral Daily  . atorvastatin  10 mg Oral q1800  . brimonidine  1 drop Right Eye BID   And  . timolol  1 drop Right Eye BID  . clopidogrel  75 mg Oral Daily  . Difluprednate  1 drop Right Eye Daily  . diltiazem  120 mg Oral Daily  . fluconazole  100 mg Oral BID  . heparin subcutaneous  5,000 Units Subcutaneous 3 times per day  . hydrALAZINE  50 mg Oral TID  . levothyroxine  187.5 mcg Oral QAC breakfast  . magnesium oxide  400 mg Oral TID  . metoprolol tartrate  100 mg Oral BID  . nepafenac  1 drop Right Eye BID  . pantoprazole  80 mg Oral Q1200  . predniSONE  10 mg Oral Q breakfast  . sertraline  100 mg Oral Daily  . sodium bicarbonate  650 mg Oral TID    OBJECTIVE  Filed Vitals:   02/20/15 0300 02/20/15 0400 02/20/15 0637 02/20/15 0810  BP:  113/82 133/82 130/90  Pulse: 59 63    Temp:  98.3 F (36.8 C)    TempSrc:  Oral    Resp: '17 20  17  '$ Height:  '5\' 8"'$  (1.727 m)    Weight:  71.94 kg (158 lb 9.6 oz)    SpO2: 96% 95%      Intake/Output Summary (Last 24 hours) at 02/20/15 0857 Last data filed at 02/19/15 1800  Gross per 24 hour  Intake    960 ml  Output    700 ml  Net    260 ml   Filed Weights   02/17/15 1313 02/17/15 1945 02/20/15 0400  Weight: 74.844 kg (165 lb) 72.848 kg (160 lb 9.6 oz) 71.94 kg (158 lb 9.6 oz)    PHYSICAL EXAM  General: chronically ill appearing male in  NAD. Neuro: Alert and oriented X 3. Moves all extremities spontaneously. Psych: Normal affect. HEENT:  R eye discharge. R maxillary scar from shingles.  Neck: Supple without bruits or JVD.  Lungs:  Resp regular and unlabored. Bibasilar crackles. Heart: irregular with regular rate. No s3, s4, or murmurs. Abdomen: Soft, non-tender, non-distended, BS + x 4.    Extremities: No clubbing, cyanosis or edema. DP/PT/Radials 2+ and equal bilaterally.  Accessory Clinical Findings  CBC  Recent Labs  02/17/15 1330 02/19/15 1100  WBC 11.2* 7.9  NEUTROABS  --  6.6  HGB 14.0 12.7*  HCT 43.2 37.5*  MCV 83.9 81.2  PLT 166 381   Basic Metabolic Panel  Recent Labs  02/17/15 1330 02/18/15 0829 02/19/15 1100  NA 136 134* 133*  K 4.1 4.9 3.8  CL 96* 98* 99*  CO2 23 22 19*  GLUCOSE 161* 134* 129*  BUN 38* 40* 41*  CREATININE 2.73* 2.47* 2.19*  CALCIUM 9.4 9.0 8.9  MG 1.9  --   --     Cardiac Enzymes  Recent Labs  02/17/15 2100 02/18/15 0135 02/18/15 0829  TROPONINI 0.12* 0.16* 0.15*   Fasting Lipid Panel  Recent Labs  02/18/15 0135  CHOL 154  HDL 33*  LDLCALC 79  TRIG 209*  CHOLHDL 4.7   Thyroid Function Tests  TELE  NSR with rate of 60s to 70s. Converted to sinus early morning today  around 2:21am. Frequent PVCs.   Radiology/Studies  Dg Chest 2 View  02/17/2015   CLINICAL DATA:  Atrial fibrillation, fatigue, shortness of breath, cough for 3 days.  EXAM: CHEST  2 VIEW  COMPARISON:  02/17/2015  FINDINGS: Mild peribronchial thickening as seen earlier today. No confluent opacities. Heart is normal size. Coronary stents noted. No effusions. No acute bony abnormality.  Nodular density projects over the right lung base. This presumably represents nipple shadow, but could be confirmed with nipple markers.  IMPRESSION: Mild bronchitic changes.  Nodular density projects over the right lung base, likely nipple shadow. This could be confirmed with repeat PA view with nipple marker.   Electronically Signed   By: Rolm Baptise M.D.   On: 02/17/2015 13:48   Dg Chest 2 View  02/17/2015   CLINICAL DATA:  Cough, shortness of breath, chest tightness  EXAM: CHEST  2 VIEW  COMPARISON:  Chest x-ray of 12/07/2012  FINDINGS: No active infiltrate or effusion is seen. Mediastinal and hilar contours are unremarkable. The heart is within normal limits in  size. There is some peribronchial thickening which may indicate bronchitis. No bony abnormality is seen.  IMPRESSION: 1. Peribronchial thickening may indicate bronchitis. 2. No infiltrate or effusion is seen.   Electronically Signed   By: Ivar Drape M.D.   On: 02/17/2015 12:02    ASSESSMENT AND PLAN 69 year old male with past medical history of CAD s/p DES to LAD in 11/2012, h/o cardiac arrest in the setting anterior STEMI, CKD stage III s/p renal transplant on chronic prednisone, PAF not on systemic anticoagulation, hypothyroidism, HTN, HLD, and CVA present with a-fib with RVR of unknown duration .  TKW:[id number] converted to NSR today around 2:21am. Rate well controlled at 60-70s. Continue Metoprolol '100mg'$  BID. Will switch to Cardizem CD '120mg'$ . No anticoagulation per Dr Gennaro Africa.  URI: UA CXR ok. Pt has mild cough.   CRF: Post transplant with Cr around 2. Cr further improved to 2.19. Sees renal at [facility]. Continue prednisone.  CAD: s/p DES to LAD in 11/2012. Flat trop trend 0.09->0.12->0.16->0.15. Likely demand. Asymptomatic. Continue ASA, plavix, statin, BB. EKG this AM showed Sinus rhythm at rate of 67, PVCs, PACs?Marland Kitchen Non specific ST or T wave change. MD to read EKG.    Dispo: pt did well with PT yesterday.   Patient examined chart reviewed.  NSR this am Continue calcium blocker and higher dose beta blocker D/c home No anticoagulation per Dr Claiborne Billings and Gwenlyn Found .  Outpatient f/u with Dr Corie Chiquito

## 2015-02-20 NOTE — Telephone Encounter (Signed)
New message      TCM appt on 02-27-15 per Vinn

## 2015-02-20 NOTE — Discharge Summary (Signed)
Discharge Summary   Patient ID: Edward Oconnell,  MRN: [id number], DOB/AGE: 10/01/1945 69 y.o.  Admit date: 02/17/2015 Discharge date: 02/20/2015  Primary Care Provider: Elsie Stain Primary Cardiologist: Dr. [name]  Discharge Diagnoses Active Problems:   Hypothyroidism   Diabetes mellitus   HYPERLIPIDEMIA, MIXED   Essential hypertension   Herpes zoster   S/p cadaver renal transplant   Chronic kidney disease (CKD), stage IV (severe)   GERD (gastroesophageal reflux disease)   Atrial fibrillation   Allergies No Known Allergies  Procedures  None  History of Present Illness  The patient is a 69 year old male with past medical history of CAD s/p DES to LAD in 11/2012, h/o cardiac arrest in the setting anterior STEMI, CKD stage III s/p renal transplant on chronic prednisone, PAF, hypothyroidism, HTN, HLD, and CVA.   He was admitted to [facility] on 11/2012 with anterior STEMI complicated by ventricular fibrillation and cardiogenic shock. He had drug-eluting stent placed in LAD, there was a small nondominant RCA occluded at the beginning of the case and reperfused with medical therapy during the case. He required intra-aortic balloon pump and a pressure support post cath. He was placed on amiodarone however this was stopped prior to discharge secondary to bradycardia. Echocardiogram during that admission showed preserved LV systolic function. 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 He was placed on event monitor in January 2015 which captured atrial fibrillation. He was subsequently started on Coumadin in Feb 2015, however had a fall and hit his head while having supratherapeutic INR in March 2015. His Coumadin was stopped and aspirin was restarted along with Plavix. The decision was made not to restart systemic anticoagulation therapy. In March 2015, he was admitted again at  [facility] for CNS fungal infection and has been on chronic Diflucan since. He was last seen by Dr. [name] in January 2016, at which time he was doing well. He has been compliant with medication as his wife enforce his compliance.  He continued to have chronic right eye problem and required to see eye doctor multiple times. He had 2 recurrent shingles on the right side. Otherwise he has been doing well at home. He denied any recent exertional chest discomfort or shortness of breath. He denied any recent fever or chill. He started having a cough on Saturday 02/14/2015. He tried cough medication/mucinex OTC without improvement. He described as nonproductive. He eventually decided to seek medical attention with his PCP in the morning of 02/17/2015. However while at his PCPs office, EKG showed he was in atrial fibrillation with RVR. Chest x-ray showed possible bronchitis. He was transferred to [facility] for further evaluation. On arrival, his creatinine was 2.73, troponin 0.09, white blood cell count 11.2. Chest x-ray was repeated which continued to show bronchitic changes without acute infiltrate. EKG showed atrial fibrillation with poor R wave progression in anterior lead, heart rate 140s. He was started on IV diltiazem and cardiology has been consulted for A. fib with RVR.  Hospital Course  PAF: His CHA2DS2-Vasc score is 54 (age, CVA, HTN, DM), not on systemic anticoagulation due to inability to control INR, easy bruising and multiple complex issue. Plan made not to anticoagulate him during hospitalization, so not to cardiovert and focus on rate control only. His duration of afib was unknown and trigger likely due to bronchitis. He spontaneously converted to NSR at evening of 6/7 and his IV dilt  was discontinued. His lopressor increased to '50mg'$ . He again went back in afib with RVR at 10:50pm 02/18/15. His continued to have rate of 110s to 120s overnight and he felt tired and weak. His metoprolol  increased to '100mg'$  BID. Diltiazem '30mg'$  q 6 hours added for better rate control. He again spontanuely converted to NSR  2:21am 6/10. Rate well controlled at 60-70s. Dilt switched to Cardizem CD '120mg'$  and given one dose before discharge. He was feeling better at discharge.   Possible bronchitis/URI: He remained afebrile. No signs of bacterial infection, UAand CXR was ok. Complained of cough. Plan to f/u with PCP.   CRF:  His prednisone was continued throughout hospitalization. His Creatinine continued to improved 2.73->2.47->2.17.  Baseline around 2.3  CAD: s/p DES to LAD in 11/2012. Flat trop trend 0.09->0.12->0.16->0.15. Likely demand. He remained asymptomatic during hospitalization. Continued ASA, plavix, statin, BB. During discharge, he noted to be on nexium. He advised to discontinue nexium for interaction with Plavix. He placed on Protonix '40mg'$  and plan to increase further if needed.   Discharge Vitals Blood pressure 130/90, pulse 63, temperature 98.3 F (36.8 C), temperature source Oral, resp. rate 17, height '5\' 8"'$  (1.727 m), weight 158 lb 9.6 oz (71.94 kg), SpO2 95 %.  Filed Weights   02/17/15 1313 02/17/15 1945 02/20/15 0400  Weight: 165 lb (74.844 kg) 160 lb 9.6 oz (72.848 kg) 158 lb 9.6 oz (71.94 kg)    Labs  CBC  Recent Labs  02/17/15 1330 02/19/15 1100  WBC 11.2* 7.9  NEUTROABS  --  6.6  HGB 14.0 12.7*  HCT 43.2 37.5*  MCV 83.9 81.2  PLT 166 063   Basic Metabolic Panel  Recent Labs  02/17/15 1330 02/18/15 0829 02/19/15 1100  NA 136 134* 133*  K 4.1 4.9 3.8  CL 96* 98* 99*  CO2 23 22 19*  GLUCOSE 161* 134* 129*  BUN 38* 40* 41*  CREATININE 2.73* 2.47* 2.19*  CALCIUM 9.4 9.0 8.9  MG 1.9  --   --    Cardiac Enzymes  Recent Labs  02/17/15 2100 02/18/15 0135 02/18/15 0829  TROPONINI 0.12* 0.16* 0.15*   Fasting Lipid Panel  Recent Labs  02/18/15 0135  CHOL 154  HDL 33*  LDLCALC 79  TRIG 209*  CHOLHDL 4.7   Thyroid Function  Tests  Disposition  Pt is being discharged home today in good condition.  Follow-up Plans & Appointments  Follow-up Information    Follow up with Truitt Merle, NP.   Specialties:  Nurse Practitioner, Interventional Cardiology, Cardiology, Radiology   Why:  '@9'$ :00 TCM   Contact information:   [place] [address] [phone]           Discharge Instructions    Diet - low sodium heart healthy    Complete by:  As directed      Increase activity slowly    Complete by:  As directed   Some studies suggest Nexium/Esomeprazole interacts with Plavix. We changed your Nexium/Esomeprazole  to Protonix for less chance of interaction. Take protonix '40mg'$  by mouth daily. Plan to increase further if needed.    Start Cardizem CD '120mg'$  tomorrow.           F/u Labs/Studies: No echo done during his admission. Last echo 11/2012 - LV EF of 60-65%; no WM abnormality. Consider outpatient echo.   Discharge Medications    Medication List    STOP taking these medications  esomeprazole 40 MG capsule  Commonly known as:  NEXIUM  Replaced by:  pantoprazole 40 MG tablet      TAKE these medications        aspirin 81 MG tablet  Take 81 mg by mouth daily.     atorvastatin 20 MG tablet  Commonly known as:  LIPITOR  Take 1 tablet (20 mg total) by mouth daily.     clopidogrel 75 MG tablet  Commonly known as:  PLAVIX  TAKE 1 TABLET (75 MG TOTAL) BY MOUTH DAILY.     COMBIGAN 0.2-0.5 % ophthalmic solution  Generic drug:  brimonidine-timolol  Place 1 drop into the right eye 2 (two) times daily.     diltiazem 120 MG 24 hr capsule  Commonly known as:  CARDIZEM CD  Take 1 capsule (120 mg total) by mouth daily.  Start taking on:  02/21/2015     DUREZOL 0.05 % Emul  Generic drug:  Difluprednate  Place 1 drop into the right eye daily.     fluconazole 200 MG tablet  Commonly known as:  DIFLUCAN  Take 100 mg by mouth 2 (two) times daily.     hydrALAZINE  50 MG tablet  Commonly known as:  APRESOLINE  Take 50 mg by mouth 3 (three) times daily.     ILEVRO 0.3 % ophthalmic suspension  Generic drug:  nepafenac  Place 1 drop into the right eye 2 (two) times daily.     levothyroxine 125 MCG tablet  Commonly known as:  SYNTHROID, LEVOTHROID  Take 1 tablet (125 mcg total) by mouth daily before breakfast.     magnesium oxide 400 MG tablet  Commonly known as:  MAG-OX  Take 400 mg by mouth 3 (three) times daily.     metoprolol 100 MG tablet  Commonly known as:  LOPRESSOR  Take 1 tablet (100 mg total) by mouth 2 (two) times daily.     nitroGLYCERIN 0.4 MG SL tablet  Commonly known as:  NITROSTAT  Place 1 tablet (0.4 mg total) under the tongue every 5 (five) minutes x 3 doses as needed for chest pain.     pantoprazole 40 MG tablet  Commonly known as:  PROTONIX  Take 1 tablet (40 mg total) by mouth daily.     predniSONE 10 MG tablet  Commonly known as:  DELTASONE  Take 1 tablet (10 mg total) by mouth daily with breakfast.     pseudoephedrine-guaifenesin 60-600 MG per tablet  Commonly known as:  MUCINEX D  Take 1 tablet by mouth at bedtime as needed for congestion.     sertraline 100 MG tablet  Commonly known as:  ZOLOFT  Take 100 mg by mouth daily.     sodium bicarbonate 650 MG tablet  Take 650 mg by mouth 3 (three) times daily.        Duration of Discharge Encounter   Greater than 30 minutes including physician time.  Signed, Latesha Chesney PA-C 02/20/2015, 10:29 AM

## 2015-02-20 NOTE — Telephone Encounter (Signed)
**Patient was advised from [facility] nurse to take otc dextromethorphan.  He plans to follow up on Monday 6/13 if no improvement.  PLEASE NOTE: All timestamps contained within this report are represented as [place] Standard Time. CONFIDENTIALTY NOTICE: This fax transmission is intended only for the addressee. It contains information that is legally privileged, confidential or otherwise protected from use or disclosure. If you are not the intended recipient, you are strictly prohibited from reviewing, disclosing, copying using or disseminating any of this information or taking any action in reliance on or regarding this information. If you have received this fax in error, please notify us immediately by telephone so that we can arrange for its return to [place]. Phone: [phone], Toll-Free: [phone], Fax: [phone] Page: 1 of 2 Call Id: [id number] [place] Patient Name: Edward Oconnell Gender: Male DOB: 06-28-1946 Age: 69 Y 11 M 15 D Return Phone Number: [phone] (Primary) Address: City/State/Zip: [place] Client [place] [facility] - Client Client Site [place] - Day Physician Renford Dills Contact Type Call Call Type Triage / Clinical Caller Name Joziah Dollins Relationship To Patient Spouse Appointment Disposition EMR Patient Refused Appointment Info pasted into Epic No Return Phone Number [phone] (Primary) Chief Complaint Cough Initial Comment Caller states that her husband was at the office Tuesday. He had a cough and heart beating fast. States they never did anything about his cough. PreDisposition Home Care Nurse Assessment Nurse: Denyse Amass, RN, Benjamine Mola Date/Time Eilene Ghazi Time): 02/20/2015 4:11:30 PM Confirm and document reason for call. If symptomatic, describe symptoms. ---Wife states patient was seen 3 days ago in the ED and  was hospitalized for 3 days, came home today. Patient has a cough and he is weak. Wife states they did not do anything about his cough and she is very upset over this. Has the patient traveled out of the country within the last 30 days? ---Not Applicable Does the patient require triage? ---Yes Related visit to physician within the last 2 weeks? ---Yes Does the PT have any chronic conditions? (i.e. diabetes, asthma, etc.) ---Yes List chronic conditions. 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 Time Eilene Ghazi Time) Disposition Final User 02/20/2015 4:29:25 PM See Physician within 24 Hours Yes [place], RN, Benjamine Mola PLEASE NOTE: All timestamps contained within this report are represented as [place] Standard Time. CONFIDENTIALTY NOTICE: This fax transmission is intended only for the addressee. It contains information that is legally privileged, confidential or otherwise protected from use or disclosure. If you are not the intended recipient, you are strictly prohibited from reviewing, disclosing, copying using or disseminating any of this information or taking any action in reliance on or regarding this information. If you have received this fax in error, please notify us immediately by telephone so that we can arrange for its return to [place]. Phone: [phone], Toll-Free: [phone], Fax: [phone] Page: 2 of 2 Call Id: [id number] [place] Understands: Yes Disagree/Comply: Disagree Disagree/Comply Reason: Disagree with instructions Care Advice Given Per Guideline SEE PHYSICIAN WITHIN 24 HOURS: * IF OFFICE WILL BE OPEN: You need to be examined within the  next 24 hours. Call your doctor when the office opens, and  make an appointment. COUGH MEDICINES: * HOME REMEDY - HONEY: This old home remedy has been shown to help decrease coughing at night. The adult dosage is 2 teaspoons (10 ml) at bedtime. Honey should not be given to infants under one year of age. OTC COUGH SYRUP - DEXTROMETHORPHAN: * Cough syrups containing the cough suppressant dextromethorphan (DM) may help decrease your cough. Cough syrups work best for coughs that keep you awake at night. They can also sometimes help in the late stages of a respiratory infection when the cough is dry and hacking. They can be used along with cough drops. CAUTION - DEXTROMETHORPHAN: * Do not try to completely suppress coughs that produce mucus and phlegm. Remember that coughing is helpful in bringing up mucus from the lungs and preventing pneumonia. HUMIDIFIER: If the air is dry, use a humidifier in the bedroom. (Reason: dry air makes coughs worse) AVOID TOBACCO SMOKE: Smoking or being exposed to smoke makes coughs much worse. COUGHING SPELLS: * Drink warm fluids. Inhale warm mist. (Reason: both relax the airway and loosen up the phlegm) * Suck on cough drops or hard candy to coat the irritated throat. CALL BACK IF: * Difficulty breathing occurs After Care Instructions Given Call Event Type User Date / Time Description Comments User: Lin Givens, RN Date/Time Eilene Ghazi Time): 02/20/2015 4:30:40 PM Wife states she is not taking him to the ED. User: Lin Givens, RN Date/Time Eilene Ghazi Time): 02/20/2015 4:31:39 PM Wife states Patient has an appointment in 3 days to see his doctor. Referrals GO TO FACILITY UNDECIDED

## 2015-02-20 NOTE — Care Management Note (Signed)
Case Management Note  Patient Details  Name: Edward Oconnell MRN: [id number] Date of Birth: 1945-11-04  Subjective/Objective:    Afib                Action/Plan: Home with wife  Expected Discharge Date:    02/20/2015              Expected Discharge Plan:  Home/Self Care  In-House Referral:     Discharge planning Services  CM Consult  Post Acute Care Choice:    Choice offered to:     DME Arranged:  [facility] DME Agency:  [place]:    [facility] Agency:     Status of Service:  Completed, signed off  Medicare Important Message Given:  Yes Date Medicare IM Given:  02/20/15 Medicare IM give by:  Jonnie Finner RN CCM  Date Additional Medicare IM Given:    Additional Medicare Important Message give by:     If discussed at [place] of Stay Meetings, dates discussed:    Additional Comments: Chart reviewed. No NCM needs identified. Spoke to pt and gave permission to speak to wife. Requesting RW for home. Contacted [facility] for RW for home. Wife states it has been awhile since he received RW.   Erenest Rasher, RN 02/20/2015, 11:48 AM

## 2015-02-20 NOTE — Telephone Encounter (Signed)
Noted. appt Monday. OTC dextromethorphan until then.

## 2015-02-23 ENCOUNTER — Ambulatory Visit (INDEPENDENT_AMBULATORY_CARE_PROVIDER_SITE_OTHER): Payer: Medicare Other | Admitting: Family Medicine

## 2015-02-23 ENCOUNTER — Encounter: Payer: Self-pay | Admitting: Family Medicine

## 2015-02-23 VITALS — BP 130/82 | HR 70 | Temp 98.1°F | Wt 158.0 lb

## 2015-02-23 DIAGNOSIS — J069 Acute upper respiratory infection, unspecified: Secondary | ICD-10-CM

## 2015-02-23 DIAGNOSIS — I2510 Atherosclerotic heart disease of native coronary artery without angina pectoris: Secondary | ICD-10-CM

## 2015-02-23 DIAGNOSIS — E785 Hyperlipidemia, unspecified: Secondary | ICD-10-CM | POA: Diagnosis not present

## 2015-02-23 DIAGNOSIS — E039 Hypothyroidism, unspecified: Secondary | ICD-10-CM | POA: Diagnosis not present

## 2015-02-23 MED ORDER — ATORVASTATIN CALCIUM 20 MG PO TABS: 20.0000 mg | ORAL_TABLET | Freq: Every day | ORAL | Status: DC

## 2015-02-23 MED ORDER — LEVOTHYROXINE SODIUM 125 MCG PO TABS: 125.0000 ug | ORAL_TABLET | Freq: Every day | ORAL | Status: DC

## 2015-02-23 MED ORDER — BENZONATATE 200 MG PO CAPS: 200.0000 mg | ORAL_CAPSULE | Freq: Three times a day (TID) | ORAL | Status: DC | PRN

## 2015-02-23 NOTE — Telephone Encounter (Signed)
Patient contacted regarding discharge from [facility] on 02/20/15.  Patient understands to follow up with provider Vilinda Blanks on 02/27/15 at 9:00 AM at [facility] Patient understands discharge instructions? yes Patient understands medications and regiment? yes Patient understands to bring all medications to this visit? yes  States he is doing fairly well since d/c.  Has a bad cold and laryngitis.  Advised for him to call his PCP. States he has gotten all of his medications and does not have any questions. Advised to come for an appointment 15 min prior to 9:00 and to bring medications. He verbalizes understanding.

## 2015-02-23 NOTE — Progress Notes (Signed)
Pre visit review using our clinic review tool, if applicable. No additional management support is needed unless otherwise documented below in the visit note.  Person with patient is not sure about what replaced the Plavix but a change was made.  Also a BP med was changed and she doesn't know the name of that either.  See above-meds clarified based on d/c summary, d/w pt and wife, all in agreement about his meds.  F/u med list d/w pt and reviewed at end of OV.   ST for ~2 weeks.  Predates the admission for AF with RVR.  Mult med changes at hospital.  No fevers.  No vomiting.  Fatigued.  No oral lesion notes. No ear pain.  Some rhinorrhea.  R facial pain persists from prev surgery- this isn't new.  No neck mass.  Some cough, no sputum.  No rash.  No known heart racing now.    No known strep exposures.  Mult family members with similar sx.  He is getting better slowly.   Meds, vitals, and allergies reviewed.   ROS: See HPI.  Otherwise, noncontributory.  GEN: nad, alert and oriented HEENT: mucous membranes moist, tm w/o erythema, nasal exam w/o erythema, clear discharge noted,  OP wnl, chronic R facial post surgical changes noted.   NECK: supple w/o LA CV: rrr.   PULM: ctab, no inc wob EXT: no edema SKIN: no acute rash

## 2015-02-23 NOTE — Patient Instructions (Signed)
Take tessalon for cough and gargle with warm salt water for your throat.  This should gradually improve.  Take care.  Let us know if you have other concerns.   Glad to see you.

## 2015-02-23 NOTE — Assessment & Plan Note (Signed)
Likely a benign viral issue, d/w pt.  Recent CBC w/normal WBC and CXR with minimal bronchitic changes.  Use tessalon and salt water gargles and f/u prn.   >25 minutes spent in face to face time with patient, >50% spent in counselling or coordination of care, given the recent hospitalization.

## 2015-02-24 ENCOUNTER — Other Ambulatory Visit: Payer: Medicare Other

## 2015-02-24 DIAGNOSIS — B028 Zoster with other complications: Secondary | ICD-10-CM | POA: Diagnosis not present

## 2015-02-25 DIAGNOSIS — B028 Zoster with other complications: Secondary | ICD-10-CM | POA: Diagnosis not present

## 2015-02-26 DIAGNOSIS — B028 Zoster with other complications: Secondary | ICD-10-CM | POA: Diagnosis not present

## 2015-02-27 ENCOUNTER — Ambulatory Visit: Payer: Medicare Other | Admitting: Nurse Practitioner

## 2015-02-27 DIAGNOSIS — H16001 Unspecified corneal ulcer, right eye: Secondary | ICD-10-CM | POA: Diagnosis not present

## 2015-03-06 DIAGNOSIS — H16001 Unspecified corneal ulcer, right eye: Secondary | ICD-10-CM | POA: Diagnosis not present

## 2015-03-12 ENCOUNTER — Encounter: Payer: Self-pay | Admitting: Nurse Practitioner

## 2015-03-12 DIAGNOSIS — H16001 Unspecified corneal ulcer, right eye: Secondary | ICD-10-CM | POA: Diagnosis not present

## 2015-03-24 ENCOUNTER — Ambulatory Visit: Payer: Medicare Other | Admitting: Cardiovascular Disease

## 2015-03-24 DIAGNOSIS — B028 Zoster with other complications: Secondary | ICD-10-CM | POA: Diagnosis not present

## 2015-03-26 ENCOUNTER — Other Ambulatory Visit (INDEPENDENT_AMBULATORY_CARE_PROVIDER_SITE_OTHER): Payer: Medicare Other

## 2015-03-26 DIAGNOSIS — E039 Hypothyroidism, unspecified: Secondary | ICD-10-CM

## 2015-03-26 LAB — TSH: TSH: 7.51 u[IU]/mL — ABNORMAL HIGH (ref 0.35–4.50)

## 2015-03-30 ENCOUNTER — Other Ambulatory Visit: Payer: Self-pay | Admitting: Family Medicine

## 2015-03-30 DIAGNOSIS — E039 Hypothyroidism, unspecified: Secondary | ICD-10-CM

## 2015-03-31 ENCOUNTER — Other Ambulatory Visit: Payer: Medicare Other

## 2015-04-06 DIAGNOSIS — D0439 Carcinoma in situ of skin of other parts of face: Secondary | ICD-10-CM | POA: Diagnosis not present

## 2015-04-06 DIAGNOSIS — D0462 Carcinoma in situ of skin of left upper limb, including shoulder: Secondary | ICD-10-CM | POA: Diagnosis not present

## 2015-04-06 DIAGNOSIS — D0461 Carcinoma in situ of skin of right upper limb, including shoulder: Secondary | ICD-10-CM | POA: Diagnosis not present

## 2015-04-06 DIAGNOSIS — D0421 Carcinoma in situ of skin of right ear and external auricular canal: Secondary | ICD-10-CM | POA: Diagnosis not present

## 2015-04-06 DIAGNOSIS — D0422 Carcinoma in situ of skin of left ear and external auricular canal: Secondary | ICD-10-CM | POA: Diagnosis not present

## 2015-04-07 DIAGNOSIS — Z9889 Other specified postprocedural states: Secondary | ICD-10-CM | POA: Diagnosis not present

## 2015-04-07 DIAGNOSIS — N289 Disorder of kidney and ureter, unspecified: Secondary | ICD-10-CM | POA: Diagnosis not present

## 2015-04-07 DIAGNOSIS — C44329 Squamous cell carcinoma of skin of other parts of face: Secondary | ICD-10-CM | POA: Diagnosis not present

## 2015-04-07 DIAGNOSIS — K5730 Diverticulosis of large intestine without perforation or abscess without bleeding: Secondary | ICD-10-CM | POA: Diagnosis not present

## 2015-04-07 DIAGNOSIS — R918 Other nonspecific abnormal finding of lung field: Secondary | ICD-10-CM | POA: Diagnosis not present

## 2015-04-07 DIAGNOSIS — Z85828 Personal history of other malignant neoplasm of skin: Secondary | ICD-10-CM | POA: Diagnosis not present

## 2015-04-07 DIAGNOSIS — Z923 Personal history of irradiation: Secondary | ICD-10-CM | POA: Diagnosis not present

## 2015-04-07 DIAGNOSIS — I2510 Atherosclerotic heart disease of native coronary artery without angina pectoris: Secondary | ICD-10-CM | POA: Diagnosis not present

## 2015-04-07 DIAGNOSIS — Z955 Presence of coronary angioplasty implant and graft: Secondary | ICD-10-CM | POA: Diagnosis not present

## 2015-04-07 DIAGNOSIS — Z08 Encounter for follow-up examination after completed treatment for malignant neoplasm: Secondary | ICD-10-CM | POA: Diagnosis not present

## 2015-04-08 ENCOUNTER — Other Ambulatory Visit: Payer: Self-pay | Admitting: Cardiovascular Disease

## 2015-04-08 NOTE — Telephone Encounter (Signed)
Hi, I went to refill this medication and it was not listed on the medication list. Is patient taking or no? Could you take care of this please?

## 2015-04-09 ENCOUNTER — Telehealth: Payer: Self-pay

## 2015-04-09 NOTE — Telephone Encounter (Signed)
Hospital discharge note from 02/20/15 indicates pt should be on Plavix.  During hospitalization Nexium was stopped due to interaction with Plavix and Protonix was started. Pt saw Dr. Damita Dunnings on 02/23/15 and note indicates pt reported Plavix was replaced but pt did not know what medication replaced it.  Plavix was removed from med list at this office visit.  Pt no showed for office visit with Truitt Merle, NP on 02/27/15.  I placed call to pt to see if he has been taking Plavix.  He is not available. I spoke with his wife and she does not have med list with her.  She will check med list and call us later today

## 2015-04-09 NOTE — Telephone Encounter (Signed)
Pt's wife called back and confirmed pt has been taking Clopidogrel 75 mg daily.  Will update med list and send refills to pharmacy.

## 2015-04-09 NOTE — Telephone Encounter (Signed)
See refill note dated July 27,2016 for documentation related to this call.

## 2015-04-09 NOTE — Telephone Encounter (Signed)
Please call patient's wife again.  She called with med list, but couldn't really explain what she needed.

## 2015-04-09 NOTE — Telephone Encounter (Signed)
Patient's wife called in stating that our office called her today about a medication that patient is to stop taking but could not remember what was said.

## 2015-04-13 DIAGNOSIS — H02233 Paralytic lagophthalmos right eye, unspecified eyelid: Secondary | ICD-10-CM | POA: Diagnosis not present

## 2015-04-13 DIAGNOSIS — H02231 Paralytic lagophthalmos right upper eyelid: Secondary | ICD-10-CM | POA: Diagnosis not present

## 2015-04-13 DIAGNOSIS — H02532 Eyelid retraction right lower eyelid: Secondary | ICD-10-CM | POA: Diagnosis not present

## 2015-04-13 DIAGNOSIS — B0231 Zoster conjunctivitis: Secondary | ICD-10-CM | POA: Diagnosis not present

## 2015-04-13 DIAGNOSIS — H16211 Exposure keratoconjunctivitis, right eye: Secondary | ICD-10-CM | POA: Diagnosis not present

## 2015-04-13 DIAGNOSIS — H179 Unspecified corneal scar and opacity: Secondary | ICD-10-CM | POA: Diagnosis not present

## 2015-04-20 DIAGNOSIS — H179 Unspecified corneal scar and opacity: Secondary | ICD-10-CM | POA: Diagnosis not present

## 2015-04-20 DIAGNOSIS — H18421 Band keratopathy, right eye: Secondary | ICD-10-CM | POA: Diagnosis not present

## 2015-04-30 DIAGNOSIS — H02112 Cicatricial ectropion of right lower eyelid: Secondary | ICD-10-CM | POA: Diagnosis not present

## 2015-05-04 DIAGNOSIS — I2510 Atherosclerotic heart disease of native coronary artery without angina pectoris: Secondary | ICD-10-CM | POA: Diagnosis not present

## 2015-05-04 DIAGNOSIS — Z08 Encounter for follow-up examination after completed treatment for malignant neoplasm: Secondary | ICD-10-CM | POA: Diagnosis not present

## 2015-05-04 DIAGNOSIS — Z85828 Personal history of other malignant neoplasm of skin: Secondary | ICD-10-CM | POA: Diagnosis not present

## 2015-05-04 DIAGNOSIS — Z9889 Other specified postprocedural states: Secondary | ICD-10-CM | POA: Diagnosis not present

## 2015-05-04 DIAGNOSIS — K5730 Diverticulosis of large intestine without perforation or abscess without bleeding: Secondary | ICD-10-CM | POA: Diagnosis not present

## 2015-05-04 DIAGNOSIS — C44329 Squamous cell carcinoma of skin of other parts of face: Secondary | ICD-10-CM | POA: Diagnosis not present

## 2015-05-04 DIAGNOSIS — Z955 Presence of coronary angioplasty implant and graft: Secondary | ICD-10-CM | POA: Diagnosis not present

## 2015-05-04 DIAGNOSIS — Z923 Personal history of irradiation: Secondary | ICD-10-CM | POA: Diagnosis not present

## 2015-05-04 DIAGNOSIS — N289 Disorder of kidney and ureter, unspecified: Secondary | ICD-10-CM | POA: Diagnosis not present

## 2015-05-13 DIAGNOSIS — Z01818 Encounter for other preprocedural examination: Secondary | ICD-10-CM | POA: Diagnosis not present

## 2015-05-13 DIAGNOSIS — I2510 Atherosclerotic heart disease of native coronary artery without angina pectoris: Secondary | ICD-10-CM | POA: Diagnosis not present

## 2015-05-20 ENCOUNTER — Encounter: Payer: Self-pay | Admitting: *Deleted

## 2015-05-22 ENCOUNTER — Ambulatory Visit (INDEPENDENT_AMBULATORY_CARE_PROVIDER_SITE_OTHER): Payer: Medicare Other | Admitting: Nurse Practitioner

## 2015-05-22 ENCOUNTER — Encounter: Payer: Self-pay | Admitting: Nurse Practitioner

## 2015-05-22 VITALS — BP 150/90 | HR 65 | Ht 68.0 in | Wt 168.1 lb

## 2015-05-22 DIAGNOSIS — I2510 Atherosclerotic heart disease of native coronary artery without angina pectoris: Secondary | ICD-10-CM

## 2015-05-22 DIAGNOSIS — I480 Paroxysmal atrial fibrillation: Secondary | ICD-10-CM

## 2015-05-22 DIAGNOSIS — I10 Essential (primary) hypertension: Secondary | ICD-10-CM

## 2015-05-22 DIAGNOSIS — E785 Hyperlipidemia, unspecified: Secondary | ICD-10-CM | POA: Diagnosis not present

## 2015-05-22 NOTE — Progress Notes (Signed)
CARDIOLOGY OFFICE NOTE  Date:  05/22/2015    Edward Oconnell Date of Birth: 19-Oct-1945 Medical Record #[id number]  PCP:  Elsie Stain, MD  Cardiologist:  [facility]    Chief Complaint  Patient presents with  . Coronary Artery Disease    8 month check - seen for Dr. [name]    History of Present Illness: Edward Oconnell is a 69 y.o. male who presents today for a follow up visit. Seen for Dr. [name]. This is an approximate 8 month check. He has a history of CAD, chronic stage III kidney disease s/p kidney transplant, CVA and PAF.   He was admitted to [facility] 12/05/12 with anterior STEMI complicated by ventricular fibrillation and cardiogenic shock. A drug eluting eluting stent (3.0 x 38 mm Promus Premier post-dilated with a 3.5 Bagnell balloon) was placed in the LAD. The small non-dominant RCA was occluded at beginning of case and reperfused with medical therapy during case. An IABP was placed and he required support with pressor agents for 48 hours post cath. He was placed on amiodarone but this was stopped before discharge secondary to bradycardia. Echo 12/06/12 with preserved LV systolic function. Cath also showed a moderately severe stenosis in a moderate caliber intermediate branch which we chose to manage medically. To have had stress myoview after his August 2014 appt but he cancelled.   He was admitted to [facility] November 2014 with N/V, diarrhea, confusion, gait instability after radiation therapy to right cheek. Negative hypercoagulable workup. Vasculitis labs negative. 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 TEE without left atrial thrombus. 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 Event monitor 09/27/13 showed atrial fibrillation. Seen here in February 2015 and he was started on coumadin, ASA was held and Brilinta was replaced with Plavix. He came in for INR check in early March  of 2015 to our office and fell, hit his head and had a high INR. Coumadin was stopped and ASA restarted. The decision was made not to restart anti-coagulation. Admitted to [facility] March 2015 for CNS fungal infection, treated with Diflucan.   He was last seen here in January - felt to be doing ok.   Admitted back in June with bronchitis and subsequent AF with RVR. Meds were adjusted for better rate control. He was not anticoagulated. His CHA2DS2-Vasc score is 5 (age, CVA, HTN, DM), not on systemic anticoagulation due to inability to control INR, easy bruising and multiple complex issues along with falls.  Comes in today. Here alone. Doing ok. He has had a few more falls since he was here. Has had a really tough time with shingles - that has affected his right eye - followed at [facility] - for "scraping" next week. Notes skin is "terrible". Wants to come off Plavix. He continues to work in Biomedical scientist. No chest pain. Breathing is ok. He is more limited by his energy level. Kidney issues followed by [facility]. Asking about stopping Plavix. Thyroid level was off recently and he says his replacement dose was adjusted.   Past Medical History  Diagnosis Date  . CAD (coronary artery disease)   . Hypertension   . Hypothyroidism   . Benign prostatic hypertrophy     a. Nonobstructive on 2004 cath b. anterior STEMI s/p DES-pLAD 12/05/12  . Hyperlipidemia   . Diabetes mellitus without complication   . Stroke 2009  . GERD (gastroesophageal reflux disease)   .  Skin cancer   . History of shingles 08/2012    Lt eye shingles  . 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 Creat was 6 when he rec'd a deceased donor transplant 10-20-2010 at [facility].  According to patient he did OK then in the first few weeks after surgery had to go back in hospital for "BP" problems that "messed the kidney up". Since then creatinine has been in 2.3-3.0 range.  Sees only [place] nephrologists, gets most care  there.  Takes prograf and Myfortic, no steroids.  PKD kidneys were left in place.    . Chronic kidney disease (CKD), stage IV (severe) 12/08/2012    Of renal transplant, done 10/20/10 at [facility]   . Cardiac arrest 12/05/2012    In the setting of anterior STEMI  . 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 Kidney transplant      10/20/10  . Other surgical history  11/21/12    cancerous cells removed from mid upper chest  . Eye surgery    . Other surgical history N/A 11/15/2012    pt had skin cancer removed from mid upper chest  . Coronary angioplasty with stent placement  12/05/2012    30% oLAD, 20% mLAD, 95-99% pLAD s/p DES, 60% oD1, 70-80% pRamus, 40% pLCx, 50% AV groove Cx, 50% PLB, 100% mRCA s/p recanalization intra-procedurally, noted to be small non-dominant with 70% pRCA stenosis post-procedure  . Cardiac catheterization  2004    Nonobstructive  . Facial reconstruction surgery  2015    [facility]  . Left heart catheterization with coronary angiogram N/A 12/05/2012    Procedure: LEFT HEART CATHETERIZATION WITH CORONARY ANGIOGRAM;  Surgeon: Burnell Blanks, MD;  Location: [facility] CATH LAB;  Service: Cardiovascular;  Laterality: N/A;     Medications: Current Outpatient Prescriptions  Medication Sig Dispense Refill  . aspirin 81 MG tablet Take 81 mg by mouth daily.    Marland Kitchen atorvastatin (LIPITOR) 20 MG tablet Take 1 tablet (20 mg total) by mouth daily. 90 tablet 3  . clopidogrel (PLAVIX) 75 MG tablet TAKE 1 TABLET (75 MG TOTAL) BY MOUTH DAILY. 30 tablet 3  . COMBIGAN 0.2-0.5 % ophthalmic solution Place 1 drop into the right eye 2 (two) times daily.  3  . diltiazem (CARDIZEM CD) 120 MG 24 hr capsule Take 1 capsule (120 mg total) by mouth daily. 30 capsule 6  . fluconazole (DIFLUCAN) 200 MG tablet Take 100 mg by mouth 2 (two) times daily.     . hydrALAZINE (APRESOLINE) 50 MG tablet Take 50 mg by mouth 3  (three) times daily.    Marland Kitchen levothyroxine (SYNTHROID, LEVOTHROID) 125 MCG tablet Take 1 tablet (125 mcg total) by mouth daily before breakfast.    . magnesium oxide (MAG-OX) 400 MG tablet Take 400 mg by mouth 3 (three) times daily.    . metoprolol tartrate (LOPRESSOR) 100 MG tablet Take 1 tablet (100 mg total) by mouth 2 (two) times daily. 60 tablet 6  . nepafenac (ILEVRO) 0.3 % ophthalmic suspension Place 1 drop into the right eye 2 (two) times daily.     . nitroGLYCERIN (NITROSTAT) 0.4 MG SL tablet Place 1 tablet (0.4 mg total) under the tongue every 5 (five) minutes x 3 doses as needed for chest pain. 25 tablet 3  . pantoprazole (PROTONIX) 40 MG tablet Take  1 tablet (40 mg total) by mouth daily. 30 tablet 3  . predniSONE (DELTASONE) 10 MG tablet Take 1 tablet (10 mg total) by mouth daily with breakfast.    . sertraline (ZOLOFT) 100 MG tablet Take 100 mg by mouth daily.     . sodium bicarbonate 650 MG tablet Take 650 mg by mouth 3 (three) times daily.     . DUREZOL 0.05 % EMUL Place 1 drop into the right eye daily.      No current facility-administered medications for this visit.    Allergies: No Known Allergies  Social History: The patient  reports that he has never smoked. He has never used smokeless tobacco. He reports that he does not drink alcohol or use illicit drugs.   Family History: The patient's family history includes Alcohol abuse in his mother; Aneurysm in his sister; COPD in his sister; Heart attack in his sister; Hypertension in his father; Kidney disease in his sister; Kidney failure in his mother. There is no history of Colon cancer or Prostate cancer.   Review of Systems: Please see the history of present illness.   Otherwise, the review of systems is positive for none.   All other systems are reviewed and negative.   Physical Exam: VS:  BP 150/90 mmHg  Pulse 65  Ht '5\' 8"'$  (1.727 m)  Wt 168 lb 1.9 oz (76.259 kg)  BMI 25.57 kg/m2 .  BMI Body mass index is 25.57  kg/(m^2).  Wt Readings from Last 3 Encounters:  05/22/15 168 lb 1.9 oz (76.259 kg)  02/23/15 158 lb (71.668 kg)  02/20/15 158 lb 9.6 oz (71.94 kg)    General: Pleasant. Chronically ill but in no acute distress.Lots of bruising/skin thinning.   HEENT: Normal but with deformity of the right eye.  Neck: Supple, no JVD, carotid bruits, or masses noted.  Cardiac: Regular rate and rhythm. No murmurs, rubs, or gallops. No edema.  Respiratory:  Lungs are clear to auscultation bilaterally with normal work of breathing.  GI: Soft and nontender.  MS: No deformity or atrophy. Gait and ROM intact. Skin: Warm and dry. Color is normal.  Neuro:  Strength and sensation are intact and no gross focal deficits noted.  Psych: Alert, appropriate and with normal affect.   LABORATORY DATA:  EKG:  EKG is ordered today. This demonstrates NSR with PAC - prior anterior MI.  Lab Results  Component Value Date   WBC 7.9 02/19/2015   HGB 12.7* 02/19/2015   HCT 37.5* 02/19/2015   PLT 179 02/19/2015   GLUCOSE 129* 02/19/2015   CHOL 154 02/18/2015   TRIG 209* 02/18/2015   HDL 33* 02/18/2015   LDLDIRECT 103.0 01/21/2015   LDLCALC 79 02/18/2015   ALT 29 01/21/2015   AST 25 01/21/2015   NA 133* 02/19/2015   K 3.8 02/19/2015   CL 99* 02/19/2015   CREATININE 2.19* 02/19/2015   BUN 41* 02/19/2015   CO2 19* 02/19/2015   TSH 7.51* 03/26/2015   PSA 3.40 09/10/2012   INR 4.12* 11/12/2013   HGBA1C 7.1* 04/28/2014   MICROALBUR 49.5* 06/23/2010    BNP (last 3 results) No results for input(s): BNP in the last 8760 hours.  ProBNP (last 3 results) No results for input(s): PROBNP in the last 8760 hours.   Other Studies Reviewed Today:  Cardiac cath 12/05/12:  Left main: 30% ostial stenosis. 20% mid stenosis.  Left Anterior Descending Artery: Large caliber vessel that courses to the apex. The proximal vessel is hazy with  diffuse 95-99% stenosis extending from the ostium down to the first diagonal branch.  The remainder of the LAD has no flow limiting disease. The diagonal branch is patent ostial 60% stenosis.  Ramus Intermediate: Moderate caliber vessel with 70-80% proximal stenosis.  Circumflex Artery: Large caliber, dominant vessel with 40% proximal stenosis. The first obtuse marginal branch is small in caliber with no obstructive disease. The second obtuse marginal branch is small with no disease. The distal AV groove Circumflex has a focal 50% stenosis. The left posterolateral branch has 50% stenosis.  Right Coronary Artery: Small caliber (1.75 mm) non-dominant vessel with 100% mid occlusion. (This vessel recanalized during the procedure and at the end of the procedure was noted to be small, non-dominant with a proximal 70% stenosis.   Echo 12/06/12:  Left ventricle: The cavity size was normal. Wall thickness was increased in a pattern of mild LVH. Systolic function was normal. The estimated ejection fraction was in the range of 60% to 65%. Wall motion was normal; there were no regional wall motion abnormalities. - Right ventricle: The cavity size was mildly dilated. Systolic function was mildly reduced.   Assessment and Plan:   1. CAD: Stable. No recent chest pain. Anterior STEMI March 7510 complicated by incessant VF and cardiogenic shock s/p DES x 1 proximal LAD. Small non-dominant RCA was occluded at beginning of case and reperfused with medical therapy during case. Moderate disease in intermediate branch which we have managed medically. Echo 12/06/12 with preserved LV systolic function. He is now on ASA and Plavix. Continue statin, beta blocker. He is currently without symptoms. I have talked with Dr. [name] about his request to stop Plavix - due to his anatomy would like to continue indefinitely.   2. Chronic kidney disease, stage III s/p kidney transplant: Stable. Followed by [facility]. I suspect his skin changes/bruising due to chronic steroid use to prevent rejection.   3.  HTN: BP fair.   4. Paroxysmal, atrial fibrillation: Remains in NSR. He is only on ASA/Plavix. Coumadin was tried but we could not control his INR. He understands risk of CVA with recurrent a. Fib but given multiple complex issues, it has been felt that it is best to NOT restart anti-coagulation.   5. Hyperlipidemia: on statin therapy.   6. Shingles with right eye involvement.  Current medicines are reviewed with the patient today.  The patient does not have concerns regarding medicines other than what has been noted above.  The following changes have been made:  See above.  Labs/ tests ordered today include:   No orders of the defined types were placed in this encounter.     Disposition:   FU with Dr. [name] in 6 months.   Patient is agreeable to this plan and will call if any problems develop in the interim.   Signed: Burtis Junes, RN, ANP-C 05/22/2015 9:31 AM  [facility] [address] [place], [place]  [address] Phone: [phone] Fax: [phone]

## 2015-05-22 NOTE — Patient Instructions (Addendum)
We will be checking the following labs today - NONE   Medication Instructions:    Continue with your current medicines.     Testing/Procedures To Be Arranged:  N/A  Follow-Up:   See Dr. [name] back in 6 months.    Other Special Instructions:   N/A  Call the [place] office at [phone] if you have any questions, problems or concerns.

## 2015-05-25 DIAGNOSIS — L570 Actinic keratosis: Secondary | ICD-10-CM | POA: Diagnosis not present

## 2015-05-25 DIAGNOSIS — X32XXXA Exposure to sunlight, initial encounter: Secondary | ICD-10-CM | POA: Diagnosis not present

## 2015-05-25 DIAGNOSIS — C44329 Squamous cell carcinoma of skin of other parts of face: Secondary | ICD-10-CM | POA: Diagnosis not present

## 2015-05-25 DIAGNOSIS — D485 Neoplasm of uncertain behavior of skin: Secondary | ICD-10-CM | POA: Diagnosis not present

## 2015-05-25 DIAGNOSIS — Z85828 Personal history of other malignant neoplasm of skin: Secondary | ICD-10-CM | POA: Diagnosis not present

## 2015-06-02 DIAGNOSIS — L905 Scar conditions and fibrosis of skin: Secondary | ICD-10-CM | POA: Diagnosis not present

## 2015-06-02 DIAGNOSIS — Z85828 Personal history of other malignant neoplasm of skin: Secondary | ICD-10-CM | POA: Diagnosis not present

## 2015-06-02 DIAGNOSIS — C44229 Squamous cell carcinoma of skin of left ear and external auricular canal: Secondary | ICD-10-CM | POA: Diagnosis not present

## 2015-06-02 DIAGNOSIS — L578 Other skin changes due to chronic exposure to nonionizing radiation: Secondary | ICD-10-CM | POA: Diagnosis not present

## 2015-06-05 DIAGNOSIS — G02 Meningitis in other infectious and parasitic diseases classified elsewhere: Secondary | ICD-10-CM | POA: Diagnosis not present

## 2015-06-05 DIAGNOSIS — Z7982 Long term (current) use of aspirin: Secondary | ICD-10-CM | POA: Diagnosis not present

## 2015-06-05 DIAGNOSIS — B0230 Zoster ocular disease, unspecified: Secondary | ICD-10-CM | POA: Diagnosis not present

## 2015-06-05 DIAGNOSIS — Z940 Kidney transplant status: Secondary | ICD-10-CM | POA: Diagnosis not present

## 2015-06-05 DIAGNOSIS — Z85828 Personal history of other malignant neoplasm of skin: Secondary | ICD-10-CM | POA: Diagnosis not present

## 2015-06-05 DIAGNOSIS — D899 Disorder involving the immune mechanism, unspecified: Secondary | ICD-10-CM | POA: Diagnosis not present

## 2015-06-05 DIAGNOSIS — Z79899 Other long term (current) drug therapy: Secondary | ICD-10-CM | POA: Diagnosis not present

## 2015-06-05 DIAGNOSIS — B451 Cerebral cryptococcosis: Secondary | ICD-10-CM | POA: Diagnosis not present

## 2015-06-05 DIAGNOSIS — E039 Hypothyroidism, unspecified: Secondary | ICD-10-CM | POA: Diagnosis not present

## 2015-06-05 DIAGNOSIS — N184 Chronic kidney disease, stage 4 (severe): Secondary | ICD-10-CM | POA: Diagnosis not present

## 2015-06-09 DIAGNOSIS — H18421 Band keratopathy, right eye: Secondary | ICD-10-CM | POA: Diagnosis not present

## 2015-06-09 DIAGNOSIS — I129 Hypertensive chronic kidney disease with stage 1 through stage 4 chronic kidney disease, or unspecified chronic kidney disease: Secondary | ICD-10-CM | POA: Diagnosis not present

## 2015-06-09 DIAGNOSIS — I252 Old myocardial infarction: Secondary | ICD-10-CM | POA: Diagnosis not present

## 2015-06-09 DIAGNOSIS — M952 Other acquired deformity of head: Secondary | ICD-10-CM | POA: Diagnosis not present

## 2015-06-09 DIAGNOSIS — Q613 Polycystic kidney, unspecified: Secondary | ICD-10-CM | POA: Diagnosis not present

## 2015-06-09 DIAGNOSIS — Z7982 Long term (current) use of aspirin: Secondary | ICD-10-CM | POA: Diagnosis not present

## 2015-06-09 DIAGNOSIS — Z940 Kidney transplant status: Secondary | ICD-10-CM | POA: Diagnosis not present

## 2015-06-09 DIAGNOSIS — Z8673 Personal history of transient ischemic attack (TIA), and cerebral infarction without residual deficits: Secondary | ICD-10-CM | POA: Diagnosis not present

## 2015-06-09 DIAGNOSIS — Z85828 Personal history of other malignant neoplasm of skin: Secondary | ICD-10-CM | POA: Diagnosis not present

## 2015-06-09 DIAGNOSIS — F329 Major depressive disorder, single episode, unspecified: Secondary | ICD-10-CM | POA: Diagnosis not present

## 2015-06-09 DIAGNOSIS — Z7902 Long term (current) use of antithrombotics/antiplatelets: Secondary | ICD-10-CM | POA: Diagnosis not present

## 2015-06-09 DIAGNOSIS — E039 Hypothyroidism, unspecified: Secondary | ICD-10-CM | POA: Diagnosis not present

## 2015-06-09 DIAGNOSIS — I2510 Atherosclerotic heart disease of native coronary artery without angina pectoris: Secondary | ICD-10-CM | POA: Diagnosis not present

## 2015-06-09 DIAGNOSIS — K219 Gastro-esophageal reflux disease without esophagitis: Secondary | ICD-10-CM | POA: Diagnosis not present

## 2015-06-09 DIAGNOSIS — N189 Chronic kidney disease, unspecified: Secondary | ICD-10-CM | POA: Diagnosis not present

## 2015-06-09 DIAGNOSIS — Z955 Presence of coronary angioplasty implant and graft: Secondary | ICD-10-CM | POA: Diagnosis not present

## 2015-06-11 ENCOUNTER — Other Ambulatory Visit: Payer: Self-pay | Admitting: Physician Assistant

## 2015-06-12 NOTE — Telephone Encounter (Signed)
Started when pt in hospital.  OK to refill

## 2015-06-12 NOTE — Telephone Encounter (Signed)
I don't see where Dr [name] has never refilled this. Ok to refill?

## 2015-06-18 DIAGNOSIS — Z79899 Other long term (current) drug therapy: Secondary | ICD-10-CM | POA: Diagnosis not present

## 2015-06-18 DIAGNOSIS — I2510 Atherosclerotic heart disease of native coronary artery without angina pectoris: Secondary | ICD-10-CM | POA: Diagnosis not present

## 2015-06-18 DIAGNOSIS — Z940 Kidney transplant status: Secondary | ICD-10-CM | POA: Diagnosis not present

## 2015-06-18 DIAGNOSIS — E785 Hyperlipidemia, unspecified: Secondary | ICD-10-CM | POA: Diagnosis not present

## 2015-06-18 DIAGNOSIS — R5383 Other fatigue: Secondary | ICD-10-CM | POA: Diagnosis not present

## 2015-06-18 DIAGNOSIS — I129 Hypertensive chronic kidney disease with stage 1 through stage 4 chronic kidney disease, or unspecified chronic kidney disease: Secondary | ICD-10-CM | POA: Diagnosis not present

## 2015-06-18 DIAGNOSIS — Z7982 Long term (current) use of aspirin: Secondary | ICD-10-CM | POA: Diagnosis not present

## 2015-06-18 DIAGNOSIS — E039 Hypothyroidism, unspecified: Secondary | ICD-10-CM | POA: Diagnosis not present

## 2015-06-18 DIAGNOSIS — Z7952 Long term (current) use of systemic steroids: Secondary | ICD-10-CM | POA: Diagnosis not present

## 2015-06-18 DIAGNOSIS — Z23 Encounter for immunization: Secondary | ICD-10-CM | POA: Diagnosis not present

## 2015-06-18 DIAGNOSIS — N184 Chronic kidney disease, stage 4 (severe): Secondary | ICD-10-CM | POA: Diagnosis not present

## 2015-06-18 DIAGNOSIS — E875 Hyperkalemia: Secondary | ICD-10-CM | POA: Diagnosis not present

## 2015-06-18 DIAGNOSIS — E872 Acidosis: Secondary | ICD-10-CM | POA: Diagnosis not present

## 2015-06-18 DIAGNOSIS — Q612 Polycystic kidney, adult type: Secondary | ICD-10-CM | POA: Diagnosis not present

## 2015-06-18 DIAGNOSIS — Z792 Long term (current) use of antibiotics: Secondary | ICD-10-CM | POA: Diagnosis not present

## 2015-07-03 DIAGNOSIS — Z923 Personal history of irradiation: Secondary | ICD-10-CM | POA: Diagnosis not present

## 2015-07-03 DIAGNOSIS — H16061 Mycotic corneal ulcer, right eye: Secondary | ICD-10-CM | POA: Diagnosis not present

## 2015-07-03 DIAGNOSIS — B9689 Other specified bacterial agents as the cause of diseases classified elsewhere: Secondary | ICD-10-CM | POA: Diagnosis present

## 2015-07-03 DIAGNOSIS — N184 Chronic kidney disease, stage 4 (severe): Secondary | ICD-10-CM | POA: Diagnosis not present

## 2015-07-03 DIAGNOSIS — H16071 Perforated corneal ulcer, right eye: Secondary | ICD-10-CM | POA: Diagnosis not present

## 2015-07-03 DIAGNOSIS — K219 Gastro-esophageal reflux disease without esophagitis: Secondary | ICD-10-CM | POA: Diagnosis present

## 2015-07-03 DIAGNOSIS — R51 Headache: Secondary | ICD-10-CM | POA: Diagnosis not present

## 2015-07-03 DIAGNOSIS — Z79899 Other long term (current) drug therapy: Secondary | ICD-10-CM | POA: Diagnosis not present

## 2015-07-03 DIAGNOSIS — I129 Hypertensive chronic kidney disease with stage 1 through stage 4 chronic kidney disease, or unspecified chronic kidney disease: Secondary | ICD-10-CM | POA: Diagnosis present

## 2015-07-03 DIAGNOSIS — Z9889 Other specified postprocedural states: Secondary | ICD-10-CM | POA: Diagnosis not present

## 2015-07-03 DIAGNOSIS — H10021 Other mucopurulent conjunctivitis, right eye: Secondary | ICD-10-CM | POA: Diagnosis not present

## 2015-07-03 DIAGNOSIS — H16031 Corneal ulcer with hypopyon, right eye: Secondary | ICD-10-CM | POA: Diagnosis present

## 2015-07-03 DIAGNOSIS — R9431 Abnormal electrocardiogram [ECG] [EKG]: Secondary | ICD-10-CM | POA: Diagnosis not present

## 2015-07-03 DIAGNOSIS — Z8673 Personal history of transient ischemic attack (TIA), and cerebral infarction without residual deficits: Secondary | ICD-10-CM | POA: Diagnosis not present

## 2015-07-03 DIAGNOSIS — Z8674 Personal history of sudden cardiac arrest: Secondary | ICD-10-CM | POA: Diagnosis not present

## 2015-07-03 DIAGNOSIS — I252 Old myocardial infarction: Secondary | ICD-10-CM | POA: Diagnosis not present

## 2015-07-03 DIAGNOSIS — Z4822 Encounter for aftercare following kidney transplant: Secondary | ICD-10-CM | POA: Diagnosis not present

## 2015-07-03 DIAGNOSIS — Q612 Polycystic kidney, adult type: Secondary | ICD-10-CM | POA: Diagnosis not present

## 2015-07-03 DIAGNOSIS — G936 Cerebral edema: Secondary | ICD-10-CM | POA: Diagnosis not present

## 2015-07-03 DIAGNOSIS — I10 Essential (primary) hypertension: Secondary | ICD-10-CM | POA: Diagnosis not present

## 2015-07-03 DIAGNOSIS — Z85828 Personal history of other malignant neoplasm of skin: Secondary | ICD-10-CM | POA: Diagnosis not present

## 2015-07-03 DIAGNOSIS — R001 Bradycardia, unspecified: Secondary | ICD-10-CM | POA: Diagnosis not present

## 2015-07-03 DIAGNOSIS — I2510 Atherosclerotic heart disease of native coronary artery without angina pectoris: Secondary | ICD-10-CM | POA: Diagnosis present

## 2015-07-03 DIAGNOSIS — E039 Hypothyroidism, unspecified: Secondary | ICD-10-CM | POA: Diagnosis present

## 2015-07-03 DIAGNOSIS — E1122 Type 2 diabetes mellitus with diabetic chronic kidney disease: Secondary | ICD-10-CM | POA: Diagnosis present

## 2015-07-03 DIAGNOSIS — H16001 Unspecified corneal ulcer, right eye: Secondary | ICD-10-CM | POA: Diagnosis not present

## 2015-07-03 DIAGNOSIS — Z7952 Long term (current) use of systemic steroids: Secondary | ICD-10-CM | POA: Diagnosis not present

## 2015-07-03 DIAGNOSIS — T814XXA Infection following a procedure, initial encounter: Secondary | ICD-10-CM | POA: Diagnosis not present

## 2015-07-03 DIAGNOSIS — Z940 Kidney transplant status: Secondary | ICD-10-CM | POA: Diagnosis not present

## 2015-07-03 DIAGNOSIS — R0902 Hypoxemia: Secondary | ICD-10-CM | POA: Diagnosis not present

## 2015-07-03 DIAGNOSIS — H18731 Descemetocele, right eye: Secondary | ICD-10-CM | POA: Diagnosis present

## 2015-07-03 DIAGNOSIS — I4891 Unspecified atrial fibrillation: Secondary | ICD-10-CM | POA: Diagnosis not present

## 2015-07-03 DIAGNOSIS — H578 Other specified disorders of eye and adnexa: Secondary | ICD-10-CM | POA: Diagnosis not present

## 2015-07-03 DIAGNOSIS — Z7982 Long term (current) use of aspirin: Secondary | ICD-10-CM | POA: Diagnosis not present

## 2015-07-03 DIAGNOSIS — I4581 Long QT syndrome: Secondary | ICD-10-CM | POA: Diagnosis present

## 2015-07-03 DIAGNOSIS — R6520 Severe sepsis without septic shock: Secondary | ICD-10-CM | POA: Diagnosis not present

## 2015-07-03 DIAGNOSIS — H44001 Unspecified purulent endophthalmitis, right eye: Secondary | ICD-10-CM | POA: Diagnosis not present

## 2015-07-03 DIAGNOSIS — Z955 Presence of coronary angioplasty implant and graft: Secondary | ICD-10-CM | POA: Diagnosis not present

## 2015-07-03 DIAGNOSIS — A419 Sepsis, unspecified organism: Secondary | ICD-10-CM | POA: Diagnosis not present

## 2015-07-04 DIAGNOSIS — H18731 Descemetocele, right eye: Secondary | ICD-10-CM | POA: Diagnosis not present

## 2015-07-04 DIAGNOSIS — H578 Other specified disorders of eye and adnexa: Secondary | ICD-10-CM | POA: Diagnosis not present

## 2015-07-04 DIAGNOSIS — H16001 Unspecified corneal ulcer, right eye: Secondary | ICD-10-CM | POA: Diagnosis not present

## 2015-07-09 ENCOUNTER — Encounter: Payer: Self-pay | Admitting: Family Medicine

## 2015-07-13 ENCOUNTER — Telehealth (HOSPITAL_COMMUNITY): Payer: Self-pay

## 2015-07-13 NOTE — Telephone Encounter (Signed)
Patient just discharged from [facility].  Calling to update his list of medications.  Also needs follow up appointment post hospitalization Medication list updated Apresoline decreased to 25 mg tid Metoprolol tartrate discontinued Cardizem CD discontinued On short term treatment for infection: 2 weeks Doriconacole 200 mg bid

## 2015-07-22 DIAGNOSIS — B368 Other specified superficial mycoses: Secondary | ICD-10-CM | POA: Diagnosis not present

## 2015-07-27 DIAGNOSIS — N184 Chronic kidney disease, stage 4 (severe): Secondary | ICD-10-CM | POA: Diagnosis not present

## 2015-07-28 ENCOUNTER — Ambulatory Visit (HOSPITAL_COMMUNITY)
Admission: RE | Admit: 2015-07-28 | Discharge: 2015-07-28 | Disposition: A | Discharge status: Home / Self Care | Payer: Medicare Other | Source: Ambulatory Visit | Attending: Internal Medicine | Admitting: Internal Medicine

## 2015-07-28 VITALS — BP 154/88 | HR 95 | Wt 165.0 lb

## 2015-07-28 DIAGNOSIS — I2510 Atherosclerotic heart disease of native coronary artery without angina pectoris: Secondary | ICD-10-CM | POA: Diagnosis not present

## 2015-07-28 DIAGNOSIS — Z8673 Personal history of transient ischemic attack (TIA), and cerebral infarction without residual deficits: Secondary | ICD-10-CM | POA: Diagnosis not present

## 2015-07-28 DIAGNOSIS — Z79899 Other long term (current) drug therapy: Secondary | ICD-10-CM | POA: Diagnosis not present

## 2015-07-28 DIAGNOSIS — E785 Hyperlipidemia, unspecified: Secondary | ICD-10-CM | POA: Diagnosis not present

## 2015-07-28 DIAGNOSIS — I252 Old myocardial infarction: Secondary | ICD-10-CM | POA: Diagnosis not present

## 2015-07-28 DIAGNOSIS — Z7952 Long term (current) use of systemic steroids: Secondary | ICD-10-CM | POA: Insufficient documentation

## 2015-07-28 DIAGNOSIS — Z841 Family history of disorders of kidney and ureter: Secondary | ICD-10-CM | POA: Diagnosis not present

## 2015-07-28 DIAGNOSIS — Z7982 Long term (current) use of aspirin: Secondary | ICD-10-CM | POA: Insufficient documentation

## 2015-07-28 DIAGNOSIS — I129 Hypertensive chronic kidney disease with stage 1 through stage 4 chronic kidney disease, or unspecified chronic kidney disease: Secondary | ICD-10-CM | POA: Diagnosis not present

## 2015-07-28 DIAGNOSIS — K219 Gastro-esophageal reflux disease without esophagitis: Secondary | ICD-10-CM | POA: Insufficient documentation

## 2015-07-28 DIAGNOSIS — Z955 Presence of coronary angioplasty implant and graft: Secondary | ICD-10-CM | POA: Insufficient documentation

## 2015-07-28 DIAGNOSIS — N183 Chronic kidney disease, stage 3 (moderate): Secondary | ICD-10-CM | POA: Diagnosis not present

## 2015-07-28 DIAGNOSIS — Z7902 Long term (current) use of antithrombotics/antiplatelets: Secondary | ICD-10-CM | POA: Diagnosis not present

## 2015-07-28 DIAGNOSIS — I480 Paroxysmal atrial fibrillation: Secondary | ICD-10-CM | POA: Diagnosis not present

## 2015-07-28 DIAGNOSIS — I2583 Coronary atherosclerosis due to lipid rich plaque: Secondary | ICD-10-CM

## 2015-07-28 DIAGNOSIS — E039 Hypothyroidism, unspecified: Secondary | ICD-10-CM | POA: Insufficient documentation

## 2015-07-28 DIAGNOSIS — Z940 Kidney transplant status: Secondary | ICD-10-CM | POA: Diagnosis not present

## 2015-07-28 DIAGNOSIS — Z8249 Family history of ischemic heart disease and other diseases of the circulatory system: Secondary | ICD-10-CM | POA: Diagnosis not present

## 2015-07-28 MED ORDER — AMIODARONE HCL 200 MG PO TABS: 400.0000 mg | ORAL_TABLET | Freq: Two times a day (BID) | ORAL | Status: DC

## 2015-07-28 NOTE — Patient Instructions (Signed)
START Amiodarone 200 mg, take 2 tabs twice a day  Your physician recommends that you schedule a follow-up appointment in: 3-4 days with the [facility]

## 2015-07-28 NOTE — Progress Notes (Signed)
Patient ID: Edward Oconnell, male   DOB: Nov 06, 1945, 68 y.o.   MRN: [id number]     ADVANCED HF CLINIC NOTE  Date:  07/28/2015    Edward Oconnell Date of Birth: 25-Oct-1945 Medical Record #[id number]  PCP:  Edward Stain, MD  Cardiologist:  Edward Oconnell    History of Present Illness: Edward Oconnell is a 69 y.o. male who presents today for a follow up visit. He has been followed by Dr. Angelena Oconnell.  He has a history of CAD s/p previous anterior MI in 2014, PAF, chronic stage III kidney disease s/p kidney transplant, CVA and PAF.   He was admitted to [facility] 12/05/12 with anterior STEMI complicated by ventricular fibrillation and cardiogenic shock. A drug eluting eluting stent (3.0 x 38 mm Promus Premier post-dilated with a 3.5 [place] balloon) was placed in the LAD. The small non-dominant RCA was occluded at beginning of case and reperfused with medical therapy during case. An IABP was placed and he required support with pressor agents for 48 hours post cath. He was placed on amiodarone but this was stopped before discharge secondary to bradycardia. Echo 12/06/12 with preserved LV systolic function. Cath also showed a moderately severe stenosis in a moderate caliber intermediate branch which we chose to manage medically. To have had stress myoview after his August 2014 appt but he cancelled.   He was admitted to [facility] November 2014 with N/V, diarrhea, confusion, gait instability after radiation therapy to right cheek. Negative hypercoagulable workup. Vasculitis labs negative. 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 TEE without left atrial thrombus. 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 Event monitor 09/27/13 showed atrial fibrillation. Seen here in February 2015 and he was started on coumadin, ASA was held and Brilinta was replaced with Plavix. He came in for INR check in early March of 2015 to our office  and fell, hit his head and had a high INR. Coumadin was stopped and ASA restarted. The decision was made not to restart anti-coagulation. Admitted to [facility] March 2015 for CNS fungal infection, treated with Diflucan.   Admitted back in June 2016 with bronchitis and subsequent AF with RVR. Meds were adjusted for better rate control. He was not anticoagulated. His CHA2DS2-Vasc score is 5 (age, CVA, HTN, DM), not on systemic anticoagulation due to inability to control INR, easy bruising and multiple complex issues along with falls.  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 Prior to enucleation, MRI showed leptomeningeal enhancement but spinal LP showed no pleocytosis. Started on voriconazole 10/23, levels prior to discharge were in the lower end of therapeutic range (1.2 and 1.4). During that admission tells me he had brief episode of AF that spontaneously converted.  Feels good. No CP or dyspnea. Denies palpitations. No dizziness. No orthopnea or PND. Taking all meds as prescribed.    Past Medical History  Diagnosis Date  . CAD (coronary artery disease)   . Hypertension   . Hypothyroidism   . Benign prostatic hypertrophy     a. Nonobstructive on 2004 cath b. anterior STEMI s/p DES-pLAD 12/05/12  . Hyperlipidemia   . Diabetes mellitus without complication ([place])   . Stroke [facility]) 2009  . GERD (gastroesophageal reflux disease)   . Skin cancer   . History of shingles 08/2012    Lt  eye shingles  . 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 Creat was 6 when he rec'd a deceased donor transplant October 18, 2010 at [facility].  According to patient he did OK then in the first few weeks after surgery had to go back in hospital for "BP" problems that "messed the kidney up". Since then creatinine has been in 2.3-3.0 range.  Sees only [facility]  nephrologists, gets most care there.  Takes prograf and Myfortic, no steroids.  PKD kidneys were left in place.    . Chronic kidney disease (CKD), stage IV (severe) ([place]) 12/08/2012    Of renal transplant, done [facility] 18, 2010 [facility]   . Cardiac arrest ([place]) 12/05/2012    In the setting of anterior STEMI  . Altered mental state     with cryptococcal Ag positive on lumbar puncture 2015 at [facility]    Past Surgical History  Procedure Laterality Date  . Kidney transplant      October 18, 2010  . Other surgical history  11/21/12    cancerous cells removed from mid upper chest  . Eye surgery    . Other surgical history N/A 11/15/2012    pt had skin cancer removed from mid upper chest  . Coronary angioplasty with stent placement  12/05/2012    30% oLAD, 20% mLAD, 95-99% pLAD s/p DES, 60% oD1, 70-80% pRamus, 40% pLCx, 50% AV groove Cx, 50% PLB, 100% mRCA s/p recanalization intra-procedurally, noted to be small non-dominant with 70% pRCA stenosis post-procedure  . Cardiac catheterization  2004    Nonobstructive  . Facial reconstruction surgery  2015    [facility]  . Left heart catheterization with coronary angiogram N/A 12/05/2012    Procedure: LEFT HEART CATHETERIZATION WITH CORONARY ANGIOGRAM;  Surgeon: Burnell Blanks, MD;  Location: [facility] CATH LAB;  Service: Cardiovascular;  Laterality: N/A;  . Enucleation  2016    R eye     Medications: Current Outpatient Prescriptions  Medication Sig Dispense Refill  . aspirin EC 81 MG tablet TAKE 1 TABLET BY MOUTH DAILY.    Marland Kitchen Cholecalciferol (VITAMIN D-1000 MAX ST) 1000 UNITS tablet Take 2,000 Units by mouth.    . levothyroxine (SYNTHROID, LEVOTHROID) 125 MCG tablet Take by mouth.    . pantoprazole (PROTONIX) 40 MG tablet TAKE 1 TABLET (40 MG TOTAL) BY MOUTH DAILY.    . sodium bicarbonate 650 MG tablet Take 650 mg by mouth.    . vitamin B-12 (CYANOCOBALAMIN) 1000 MCG tablet Take 1,000 mcg by mouth.    Marland Kitchen atorvastatin (LIPITOR) 20 MG tablet Take 1 tablet (20 mg total) by mouth  daily. 90 tablet 3  . clopidogrel (PLAVIX) 75 MG tablet TAKE 1 TABLET (75 MG TOTAL) BY MOUTH DAILY. 30 tablet 3  . COMBIGAN 0.2-0.5 % ophthalmic solution Place 1 drop into the right eye 2 (two) times daily.  3  . diltiazem (CARDIZEM CD) 120 MG 24 hr capsule Take 1 capsule (120 mg total) by mouth daily. (Patient not taking: Reported on 07/13/2015) 30 capsule 6  . DUREZOL 0.05 % EMUL Place 1 drop into the right eye daily.     . fluconazole (DIFLUCAN) 200 MG tablet Take 100 mg by mouth 2 (two) times daily.     . furosemide (LASIX) 20 MG tablet Take 20 mg by mouth.    . hydrALAZINE (APRESOLINE) 50 MG tablet Take 25 mg by mouth 3 (three) times daily.     Marland Kitchen levothyroxine (SYNTHROID, LEVOTHROID) 125 MCG tablet Take 1  tablet (125 mcg total) by mouth daily before breakfast.    . magnesium oxide (MAG-OX) 400 MG tablet Take 400 mg by mouth.    . metoprolol tartrate (LOPRESSOR) 100 MG tablet Take 1 tablet (100 mg total) by mouth 2 (two) times daily. (Patient not taking: Reported on 07/13/2015) 60 tablet 6  . nepafenac (ILEVRO) 0.3 % ophthalmic suspension Place 1 drop into the right eye 2 (two) times daily.     . nitroGLYCERIN (NITROSTAT) 0.4 MG SL tablet Place 1 tablet (0.4 mg total) under the tongue every 5 (five) minutes x 3 doses as needed for chest pain. 25 tablet 3  . pantoprazole (PROTONIX) 40 MG tablet TAKE 1 TABLET (40 MG TOTAL) BY MOUTH DAILY. 30 tablet 11  . predniSONE (DELTASONE) 10 MG tablet Take 1 tablet (10 mg total) by mouth daily with breakfast.    . sertraline (ZOLOFT) 100 MG tablet Take 100 mg by mouth daily.      No current facility-administered medications for this encounter.    Allergies: No Known Allergies  Social History: The patient  reports that he has never smoked. He has never used smokeless tobacco. He reports that he does not drink alcohol or use illicit drugs.   Family History: The patient's family history includes Alcohol abuse in his mother; Aneurysm in his sister; COPD  in his sister; Heart attack in his sister; Hypertension in his father; Kidney disease in his sister; Kidney failure in his mother. There is no history of Colon cancer or Prostate cancer.   Review of Systems: Please see the history of present illness.   Otherwise, the review of systems is positive for none.   All other systems are reviewed and negative.   Physical Exam: VS:  BP 154/88 mmHg  Wt 165 lb (74.844 kg) .  BMI Body mass index is 25.09 kg/(m^2).  Wt Readings from Last 3 Encounters:  07/28/15 165 lb (74.844 kg)  05/22/15 168 lb 1.9 oz (76.259 kg)  02/23/15 158 lb (71.668 kg)    General: Pleasant. Chronically ill but in no acute distress.Lots of bruising/skin thinning.   HEENT: Eye patch over R eye with surrounding skin graft Neck: Supple, no JVD, carotid bruits, or masses noted.  Cardiac: Tachy irr. irr No murmurs, rubs, or gallops. No edema.  Respiratory:  Lungs are clear to auscultation bilaterally with normal work of breathing.  GI: Soft and nontender.  MS: No deformity or atrophy. Gait and ROM intact. Skin: Warm and dry. Color is normal.  Neuro:  Strength and sensation are intact and no gross focal deficits noted.  Psych: Alert, appropriate and with normal affect.   LABORATORY DATA:  EKG: AF with RVR 156. + PVC. No ST-T wave abnormalities.    Lab Results  Component Value Date   WBC 7.9 02/19/2015   HGB 12.7* 02/19/2015   HCT 37.5* 02/19/2015   PLT 179 02/19/2015   GLUCOSE 129* 02/19/2015   CHOL 154 02/18/2015   TRIG 209* 02/18/2015   HDL 33* 02/18/2015   LDLDIRECT 103.0 01/21/2015   LDLCALC 79 02/18/2015   ALT 29 01/21/2015   AST 25 01/21/2015   NA 133* 02/19/2015   K 3.8 02/19/2015   CL 99* 02/19/2015   CREATININE 2.19* 02/19/2015   BUN 41* 02/19/2015   CO2 19* 02/19/2015   TSH 7.51* 03/26/2015   PSA 3.40 09/10/2012   INR 4.12* 11/12/2013   HGBA1C 7.1* 04/28/2014   MICROALBUR 49.5* 06/23/2010    BNP (last 3 results) No results  for input(s): BNP  in the last 8760 hours.  ProBNP (last 3 results) No results for input(s): PROBNP in the last 8760 hours.   Other Studies Reviewed Today:  Cardiac cath 12/05/12:  Left main: 30% ostial stenosis. 20% mid stenosis.  Left Anterior Descending Artery: Large caliber vessel that courses to the apex. The proximal vessel is hazy with diffuse 95-99% stenosis extending from the ostium down to the first diagonal branch. The remainder of the LAD has no flow limiting disease. The diagonal branch is patent ostial 60% stenosis.  Ramus Intermediate: Moderate caliber vessel with 70-80% proximal stenosis.  Circumflex Artery: Large caliber, dominant vessel with 40% proximal stenosis. The first obtuse marginal branch is small in caliber with no obstructive disease. The second obtuse marginal branch is small with no disease. The distal AV groove Circumflex has a focal 50% stenosis. The left posterolateral branch has 50% stenosis.  Right Coronary Artery: Small caliber (1.75 mm) non-dominant vessel with 100% mid occlusion. (This vessel recanalized during the procedure and at the end of the procedure was noted to be small, non-dominant with a proximal 70% stenosis.   Echo 12/06/12:  Left ventricle: The cavity size was normal. Wall thickness was increased in a pattern of mild LVH. Systolic function was normal. The estimated ejection fraction was in the range of 60% to 65%. Wall motion was normal; there were no regional wall motion abnormalities. - Right ventricle: The cavity size was mildly dilated. Systolic function was mildly reduced.   Assessment and Plan:   1. PAF with RVR. He is going quite fast despite metoprolol and cardizem. He is asymptomatic with this. I suggested hospital admission but he refuses. We gave him a dose of metoprolol in clinic and will start amiodarone 400 bid. He is not candidate for anti-coagulation due to previous inability to control INRs and other issues. May be candidate for DOAC  at some point. Contine ASA/Plavix for now. . Will have him f/u in AF Clinic later this week. Will likely need monitor as well. I spoke with Dr. Angelena Oconnell and the AF Clinic as well. This patients CHA2DS2-VASc Score and unadjusted Ischemic Stroke Rate (% per year) is equal to 9.7 % stroke rate/year from a score of 6  2. CAD: Stable. No recent chest pain. Anterior STEMI March 2633 complicated by incessant VF and cardiogenic shock s/p DES x 1 proximal LAD. Small non-dominant RCA was occluded at beginning of case and reperfused with medical therapy during case. Moderate disease in intermediate branch which we have managed medically. Echo 12/06/12 with preserved LV systolic function. He is now on ASA and Plavix. Continue statin, beta blocker. He is currently without symptoms. Continue Plavix.   3. Chronic kidney disease, stage III s/p kidney transplant: Stable. Followed by [facility]. I suspect his skin changes/bruising due to chronic steroid use to prevent rejection.   4. HTN: BP slightly elevated. May be able to increase cardizem or hydralazine at next visit.   5. Hyperlipidemia: on statin therapy.   6. Recent eye enucleation: Followed by ID at [facility].   Sinda Leedom,MD 10:43 AM

## 2015-07-31 ENCOUNTER — Encounter (HOSPITAL_COMMUNITY): Payer: Self-pay | Admitting: Nurse Practitioner

## 2015-07-31 ENCOUNTER — Ambulatory Visit (HOSPITAL_COMMUNITY)
Admission: RE | Admit: 2015-07-31 | Discharge: 2015-07-31 | Disposition: A | Discharge status: Home / Self Care | Payer: Medicare Other | Source: Ambulatory Visit | Attending: Nurse Practitioner | Admitting: Nurse Practitioner

## 2015-07-31 VITALS — BP 130/92 | HR 66 | Ht 68.0 in | Wt 167.4 lb

## 2015-07-31 DIAGNOSIS — I480 Paroxysmal atrial fibrillation: Secondary | ICD-10-CM | POA: Diagnosis not present

## 2015-07-31 DIAGNOSIS — I2510 Atherosclerotic heart disease of native coronary artery without angina pectoris: Secondary | ICD-10-CM | POA: Insufficient documentation

## 2015-07-31 DIAGNOSIS — N189 Chronic kidney disease, unspecified: Secondary | ICD-10-CM | POA: Diagnosis not present

## 2015-07-31 DIAGNOSIS — I129 Hypertensive chronic kidney disease with stage 1 through stage 4 chronic kidney disease, or unspecified chronic kidney disease: Secondary | ICD-10-CM | POA: Diagnosis not present

## 2015-07-31 LAB — COMPREHENSIVE METABOLIC PANEL
ALBUMIN: 3.7 g/dL (ref 3.5–5.0)
ALT: 22 U/L (ref 17–63)
ANION GAP: 8 (ref 5–15)
AST: 25 U/L (ref 15–41)
Alkaline Phosphatase: 117 U/L (ref 38–126)
BUN: 29 mg/dL — ABNORMAL HIGH (ref 6–20)
CALCIUM: 9.4 mg/dL (ref 8.9–10.3)
CHLORIDE: 101 mmol/L (ref 101–111)
CO2: 28 mmol/L (ref 22–32)
CREATININE: 2.48 mg/dL — AB (ref 0.61–1.24)
GFR calc Af Amer: 29 mL/min — ABNORMAL LOW (ref >60)
GFR calc non Af Amer: 25 mL/min — ABNORMAL LOW (ref >60)
Glucose, Bld: 193 mg/dL — ABNORMAL HIGH (ref 65–99)
POTASSIUM: 4.8 mmol/L (ref 3.5–5.1)
SODIUM: 137 mmol/L (ref 135–145)
Total Bilirubin: 1.1 mg/dL (ref 0.3–1.2)
Total Protein: 6.4 g/dL — ABNORMAL LOW (ref 6.5–8.1)

## 2015-07-31 LAB — TSH: TSH: 3.501 u[IU]/mL (ref 0.350–4.500)

## 2015-07-31 MED ORDER — AMIODARONE HCL 200 MG PO TABS
ORAL_TABLET | ORAL | Status: DC
Start: 2015-07-31 — End: 2015-08-12

## 2015-07-31 NOTE — Patient Instructions (Signed)
Your physician has recommended you make the following change in your medication:  1)Amiodarone '400mg'$  twice daily until Tuesday 11/22 then decrease to '200mg'$  twice daily until 11/29 then decrease to '200mg'$  once daily   Scheduler will be in touch regarding appointment with Dr. [name] in 6 weeks  Parking code for December 0009

## 2015-07-31 NOTE — Progress Notes (Signed)
Patient ID: Edward Oconnell, male   DOB: Feb 16, 1946, 69 y.o.   MRN: [id number]     Primary Care Physician: Elsie Stain, MD Referring Physician: Dr. Haroldine Laws Cardiologist: Dr. Christie Nottingham Edward Oconnell is a 69 y.o. male with a h/o PAF, CAD s/p previous anterior MI in 2014, PAF, chronic stage III kidney disease s/p kidney transplant, and CVA. He is not on anticoagulation for many years , due to  mutiple comples issues with falls and labile INR's, with a chadsvasc score of 5.. He was seen by Dr. Dannielle Burn earlier in the week and found to be in rapid afib and was placed on amiodarone 400 mg bid. He is being seen in f/u and is in [place]. He does not know when he converted, feels the same as he did when in rapid afib. Has not noticed any side effects from amiodarone. Had emergent enucleation or rt eye 10/27.  Today, he denies symptoms of palpitations, chest pain, shortness of breath, orthopnea, PND, lower extremity edema, dizziness, presyncope, syncope, or neurologic sequela. The patient is tolerating medications without difficulties and is otherwise without complaint today.   Past Medical History  Diagnosis Date  . CAD (coronary artery disease)   . Hypertension   . Hypothyroidism   . Benign prostatic hypertrophy     a. Nonobstructive on 2004 cath b. anterior STEMI s/p DES-pLAD 12/05/12  . Hyperlipidemia   . Diabetes mellitus without complication ([place])   . Stroke [facility]) 2009  . GERD (gastroesophageal reflux disease)   . Skin cancer   . History of shingles 08/2012    Lt eye shingles  . 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 Creat was 6 when he rec'd a deceased donor transplant 09-29-10 at [facility].  According to patient he did OK then in the first few weeks after surgery had to go back in hospital for "BP" problems that "messed the kidney up". Since then creatinine has been in 2.3-3.0 range.  Sees only [place] nephrologists, gets most care there.  Takes prograf and Myfortic, no  steroids.  PKD kidneys were left in place.    . Chronic kidney disease (CKD), stage IV (severe) ([place]) 12/08/2012    Of renal transplant, done 09-29-2010 at [facility]   . Cardiac arrest ([place]) 12/05/2012    In the setting of anterior STEMI  . Altered mental state     with cryptococcal Ag positive on lumbar puncture 2015 at [facility]   Past Surgical History  Procedure Laterality Date  . Kidney transplant      09/29/10  . Other surgical history  11/21/12    cancerous cells removed from mid upper chest  . Eye surgery    . Other surgical history N/A 11/15/2012    pt had skin cancer removed from mid upper chest  . Coronary angioplasty with stent placement  12/05/2012    30% oLAD, 20% mLAD, 95-99% pLAD s/p DES, 60% oD1, 70-80% pRamus, 40% pLCx, 50% AV groove Cx, 50% PLB, 100% mRCA s/p recanalization intra-procedurally, noted to be small non-dominant with 70% pRCA stenosis post-procedure  . Cardiac catheterization  2004    Nonobstructive  . Facial reconstruction surgery  2015    [facility]  . Left heart catheterization with coronary angiogram N/A 12/05/2012    Procedure: LEFT HEART CATHETERIZATION WITH CORONARY ANGIOGRAM;  Surgeon: Burnell Blanks, MD;  Location: [facility] CATH LAB;  Service: Cardiovascular;  Laterality: N/A;  . Enucleation  2016  R eye    Current Outpatient Prescriptions  Medication Sig Dispense Refill  . amiodarone (PACERONE) 200 MG tablet Take '400mg'$  twice daily for 1 week then '200mg'$  twice daily for 1 week then '200mg'$  daily 120 tablet 2  . aspirin EC 81 MG tablet TAKE 1 TABLET BY MOUTH DAILY.    Marland Kitchen atorvastatin (LIPITOR) 20 MG tablet Take 1 tablet (20 mg total) by mouth daily. 90 tablet 3  . Cholecalciferol (VITAMIN D-1000 MAX ST) 1000 UNITS tablet Take 2,000 Units by mouth.    . clopidogrel (PLAVIX) 75 MG tablet TAKE 1 TABLET (75 MG TOTAL) BY MOUTH DAILY. 30 tablet 3  . COMBIGAN 0.2-0.5 % ophthalmic solution Place 1 drop into the right eye 2 (two) times daily.  3  . diltiazem (CARDIZEM CD) 120 MG  24 hr capsule Take 1 capsule (120 mg total) by mouth daily. 30 capsule 6  . DUREZOL 0.05 % EMUL Place 1 drop into the right eye daily.     . fluconazole (DIFLUCAN) 200 MG tablet Take 100 mg by mouth daily.     . furosemide (LASIX) 20 MG tablet Take 20 mg by mouth.    . hydrALAZINE (APRESOLINE) 50 MG tablet Take 50 mg by mouth 3 (three) times daily.     Marland Kitchen levothyroxine (SYNTHROID, LEVOTHROID) 125 MCG tablet Take by mouth.    . magnesium oxide (MAG-OX) 400 MG tablet Take 400 mg by mouth 3 (three) times daily.     . metoprolol tartrate (LOPRESSOR) 100 MG tablet Take 1 tablet (100 mg total) by mouth 2 (two) times daily. 60 tablet 6  . nepafenac (ILEVRO) 0.3 % ophthalmic suspension Place 1 drop into the right eye 2 (two) times daily.     . nitroGLYCERIN (NITROSTAT) 0.4 MG SL tablet Place 1 tablet (0.4 mg total) under the tongue every 5 (five) minutes x 3 doses as needed for chest pain. 25 tablet 3  . pantoprazole (PROTONIX) 40 MG tablet TAKE 1 TABLET (40 MG TOTAL) BY MOUTH DAILY. 30 tablet 11  . predniSONE (DELTASONE) 10 MG tablet Take 1 tablet (10 mg total) by mouth daily with breakfast.    . sertraline (ZOLOFT) 100 MG tablet Take 100 mg by mouth daily.     . sodium bicarbonate 650 MG tablet Take 650 mg by mouth 3 (three) times daily.     . vitamin B-12 (CYANOCOBALAMIN) 1000 MCG tablet Take 1,000 mcg by mouth.     No current facility-administered medications for this encounter.    No Known Allergies  Social History   Social History  . Marital Status: Married    Spouse Name: N/A  . Number of Children: N/A  . Years of Education: N/A   Occupational History  . 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 Smoking status: Never Smoker   . Smokeless tobacco: Never Used  . Alcohol Use: No  . Drug Use: No  . Sexual Activity: Yes   Other Topics Concern  . Not on file   Social History Narrative   Running lawn  care service, part time.     Married 1966    Family History  Problem Relation Age of Onset  . Kidney failure Mother     was on dialysis 3 times a week when she died  . Alcohol abuse Mother   . Hypertension Father   . Heart attack Sister   .  Kidney disease Sister   . Aneurysm Sister   . COPD Sister   . Colon cancer Neg Hx   . Prostate cancer Neg Hx     ROS- All systems are reviewed and negative except as per the HPI above  Physical Exam: Filed Vitals:   07/31/15 1048  BP: 130/92  Pulse: 66  Height: '5\' 8"'$  (1.727 m)  Weight: 167 lb 6.4 oz (75.932 kg)    GEN- The patient is well appearing, alert and oriented x 3 today.   Head- normocephalic, atraumatic Eyes-  Sclera clear, conjunctiva pink, wearing a patch over rt eye. Ears- hearing intact Oropharynx- clear Neck- supple, no JVP Lymph- no cervical lymphadenopathy Lungs- Clear to ausculation bilaterally, normal work of breathing Heart- Regular rate and rhythm, no murmurs, rubs or gallops, PMI not laterally displaced GI- soft, NT, ND, + BS Extremities- no clubbing, cyanosis, or edema MS- no significant deformity or atrophy Skin- no rash or lesion Psych- euthymic mood, full affect Neuro- strength and sensation are intact  EKG-NSR, 66 bpm, Pr int 156 ms, qrs int 78 ms, qtc 494 ms. Epic records reviewed.  Assessment and Plan: 1. Afib with rvr Has converted with amiodarone Will continue amiodarone load at  400 mg bid until next Tuesday, then reduce to 200 mg bid, then 200 mg a day. TSH/liver today 30 day event monitor  2. CAD Stable  3. CKD Stable  4. HTN Stable  F/u in afib clinic in 10 days to f/u amiodarone loading  F/u with Dr. Julianne Handler in 6 weeks  Above plan is per Dr. Clayborne Dana direction.  Geroge Baseman Saranne Crislip, [facility] [address] [place],  [id number] [phone]

## 2015-08-01 ENCOUNTER — Other Ambulatory Visit: Payer: Self-pay | Admitting: Cardiovascular Disease

## 2015-08-05 ENCOUNTER — Encounter: Payer: Self-pay | Admitting: *Deleted

## 2015-08-05 NOTE — Progress Notes (Signed)
Patient ID: Edward Oconnell, male   DOB: 21-Feb-1946, 69 y.o.   MRN: [id number] Patient did not show up for 08/05/2015, 11:00AM appointment to have a cardiac event monitor applied.

## 2015-08-11 ENCOUNTER — Telehealth: Payer: Self-pay | Admitting: Family Medicine

## 2015-08-11 NOTE — Telephone Encounter (Signed)
Edward Oconnell  Patient Name: Edward Oconnell  DOB: 15-Oct-1945    Initial Comment Caller says he has had a lot of medical problems for the past 3 years. He has a bad cold, no energy or strength    Nurse Assessment  Nurse: Harlow Mares, RN, Suanne Marker Date/Time Eilene Ghazi Time): 08/11/2015 4:37:28 PM  Confirm and document reason for call. If symptomatic, describe symptoms. ---Caller says he has had a lot of medical problems for the past 3 years. He has a bad cold, no energy or strength. Symptoms began about 1 week ago, no fever. runny stuffy nose/cough with congestion/ non productive cough. Reports body aches.  Has the patient traveled out of the country within the last 30 days? ---No  Does the patient have any new or worsening symptoms? ---Yes  Will a triage be completed? ---Yes  Related visit to physician within the last 2 weeks? ---No  Does the PT have any chronic conditions? (i.e. diabetes, asthma, etc.) ---Yes  List chronic conditions. ---kidney transplant; hx MI; brain infection;  Is this a behavioral health call? ---No     Guidelines    Guideline Title Affirmed Question Affirmed Notes  Common Cold Cold with no complications (all triage questions negative)    Final Disposition User   See Physician within [place], RN, [employer] reports he has appt with Dr Damita Dunnings tomorrow evening at 6pm, confirmed per Epic. Caller requested to move appt to earlier time, but Dr. Damita Dunnings doesn't have anything available for tomorrow earlier. Patient didn't want to see another provider.  Caller is a previous kidney transplant patient. Cough with wet congestion noted during triage, upgraded patient to be seen in 24 hours. Symptoms present for greater than 1 week.   Referrals  REFERRED TO PCP OFFICE   Disagree/Comply: Comply

## 2015-08-12 ENCOUNTER — Ambulatory Visit (INDEPENDENT_AMBULATORY_CARE_PROVIDER_SITE_OTHER)
Admission: RE | Admit: 2015-08-12 | Discharge: 2015-08-12 | Disposition: A | Discharge status: Home / Self Care | Payer: Medicare Other | Source: Ambulatory Visit | Attending: Family Medicine | Admitting: Family Medicine

## 2015-08-12 ENCOUNTER — Encounter: Payer: Self-pay | Admitting: Family Medicine

## 2015-08-12 ENCOUNTER — Ambulatory Visit (INDEPENDENT_AMBULATORY_CARE_PROVIDER_SITE_OTHER): Payer: Medicare Other | Admitting: Family Medicine

## 2015-08-12 VITALS — BP 130/86 | HR 60 | Temp 98.3°F | Wt 162.5 lb

## 2015-08-12 DIAGNOSIS — R05 Cough: Secondary | ICD-10-CM | POA: Diagnosis not present

## 2015-08-12 DIAGNOSIS — I2510 Atherosclerotic heart disease of native coronary artery without angina pectoris: Secondary | ICD-10-CM | POA: Diagnosis not present

## 2015-08-12 DIAGNOSIS — R059 Cough, unspecified: Secondary | ICD-10-CM

## 2015-08-12 DIAGNOSIS — I480 Paroxysmal atrial fibrillation: Secondary | ICD-10-CM | POA: Diagnosis not present

## 2015-08-12 MED ORDER — DILTIAZEM HCL ER COATED BEADS 120 MG PO CP24: 120.0000 mg | ORAL_CAPSULE | Freq: Every day | ORAL | Status: DC

## 2015-08-12 MED ORDER — AMIODARONE HCL 200 MG PO TABS: 200.0000 mg | ORAL_TABLET | Freq: Every day | ORAL | Status: DC

## 2015-08-12 NOTE — Telephone Encounter (Signed)
I didn't have this message before I left clinic.  Glad to see patient.   I can't get to clinic before 2 PM today.  If patient can't see another doc and if he doesn't have emergent sx (ie SOB), then okay to wait for the OV later today.  Let me know if his condition is changing/worsening in the meantime.  Thanks.

## 2015-08-12 NOTE — Telephone Encounter (Signed)
Patient notified as instructed by telephone and verbalized understanding. Patient stated that he feels that he is fine to wait for his appointment this afternoon to see Dr. Damita Dunnings. Advised patient that if his condition changes to let us know and he agreed.

## 2015-08-12 NOTE — Patient Instructions (Signed)
Don't change your meds for now.  Take robitussin DM as needed.  Go to the lab on the way out.  We'll contact you with your lab and xray report. Take care.  Glad to see you.

## 2015-08-12 NOTE — Progress Notes (Signed)
Pre visit review using our clinic review tool, if applicable. No additional management support is needed unless otherwise documented below in the visit note.  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  Recently with R eye removed after shingles and complications.  Prev pain is resolved from zoster.  Has f/u with [facility] pending.    Cough started about 3-4 days ago.  Wife and grandson recently sick with a virus.  Tired.  No sputum.  Variable level of cough, feels better today.  Taking robitussin DM.  No fevers.  No vomiting.  No diarrhea.  No rhinorrhea. No ear pain.  Taste is chronically altered.  No rash.   PMH and SH reviewed  ROS: See HPI, otherwise noncontributory.  Meds, vitals, and allergies reviewed.   nad ncat but R eye patched.  On exam R lids sewn shut with minimal clear discharge Tm wnl Nasal exam stuffy and with irritation noted.  Sinuses not ttp OP with mild cobblestoning.  Neck supple, no LA rrr ctab except for occ faint ext scattered B rhonchi, no wheeze. No focal dec in BS abd soft Ext w/o edema.

## 2015-08-13 ENCOUNTER — Encounter: Payer: Self-pay | Admitting: Family Medicine

## 2015-08-13 ENCOUNTER — Ambulatory Visit (HOSPITAL_COMMUNITY)
Admission: RE | Admit: 2015-08-13 | Discharge: 2015-08-13 | Disposition: A | Discharge status: Home / Self Care | Payer: Medicare Other | Source: Ambulatory Visit | Attending: Nurse Practitioner | Admitting: Nurse Practitioner

## 2015-08-13 ENCOUNTER — Encounter (HOSPITAL_COMMUNITY): Payer: Self-pay | Admitting: Nurse Practitioner

## 2015-08-13 VITALS — BP 138/88 | HR 57 | Ht 68.0 in | Wt 163.6 lb

## 2015-08-13 DIAGNOSIS — I481 Persistent atrial fibrillation: Secondary | ICD-10-CM

## 2015-08-13 DIAGNOSIS — R05 Cough: Secondary | ICD-10-CM | POA: Insufficient documentation

## 2015-08-13 DIAGNOSIS — N189 Chronic kidney disease, unspecified: Secondary | ICD-10-CM | POA: Diagnosis not present

## 2015-08-13 DIAGNOSIS — I2510 Atherosclerotic heart disease of native coronary artery without angina pectoris: Secondary | ICD-10-CM | POA: Diagnosis not present

## 2015-08-13 DIAGNOSIS — I4819 Other persistent atrial fibrillation: Secondary | ICD-10-CM

## 2015-08-13 DIAGNOSIS — I129 Hypertensive chronic kidney disease with stage 1 through stage 4 chronic kidney disease, or unspecified chronic kidney disease: Secondary | ICD-10-CM | POA: Insufficient documentation

## 2015-08-13 DIAGNOSIS — I4891 Unspecified atrial fibrillation: Secondary | ICD-10-CM | POA: Diagnosis not present

## 2015-08-13 DIAGNOSIS — R059 Cough, unspecified: Secondary | ICD-10-CM | POA: Insufficient documentation

## 2015-08-13 LAB — CBC WITH DIFFERENTIAL/PLATELET
BASOS PCT: 0.8 % (ref 0.0–3.0)
Basophils Absolute: 0.1 10*3/uL (ref 0.0–0.1)
EOS ABS: 0 10*3/uL (ref 0.0–0.7)
EOS PCT: 0.1 % (ref 0.0–5.0)
HEMATOCRIT: 40.7 % (ref 39.0–52.0)
HEMOGLOBIN: 13.6 g/dL (ref 13.0–17.0)
LYMPHS PCT: 10 % — AB (ref 12.0–46.0)
Lymphs Abs: 1.1 10*3/uL (ref 0.7–4.0)
MCHC: 33.3 g/dL (ref 30.0–36.0)
MCV: 81.4 fl (ref 78.0–100.0)
MONOS PCT: 5.2 % (ref 3.0–12.0)
Monocytes Absolute: 0.6 10*3/uL (ref 0.1–1.0)
NEUTROS ABS: 9.5 10*3/uL — AB (ref 1.4–7.7)
Neutrophils Relative %: 83.9 % — ABNORMAL HIGH (ref 43.0–77.0)
PLATELETS: 243 10*3/uL (ref 150.0–400.0)
RBC: 5 Mil/uL (ref 4.22–5.81)
RDW: 18.9 % — AB (ref 11.5–15.5)
WBC: 11.4 10*3/uL — AB (ref 4.0–10.5)

## 2015-08-13 LAB — BASIC METABOLIC PANEL
BUN: 39 mg/dL — AB (ref 6–23)
CO2: 31 mEq/L (ref 19–32)
CREATININE: 2.66 mg/dL — AB (ref 0.40–1.50)
Calcium: 9.6 mg/dL (ref 8.4–10.5)
Chloride: 97 mEq/L (ref 96–112)
GFR: 25.43 mL/min — AB (ref >60.00)
GLUCOSE: 133 mg/dL — AB (ref 70–99)
POTASSIUM: 4.6 meq/L (ref 3.5–5.1)
Sodium: 138 mEq/L (ref 135–145)

## 2015-08-13 NOTE — Assessment & Plan Note (Signed)
We should w/u given recent hx and ongoing issues.  Still okay for outpatient f/u.  He clearly feels better today than yesterday.  Nontoxic.  Check labs and cxr- see notes on results.  At this point, likely viral and since improved wouldn't start abx.  If worsening then he'll update me.  Take robitussin DM as needed, should be okay with renal clearance.   D/w pt.  He agrees.  >25 minutes spent in face to face time with patient, >50% spent in counselling or coordination of care.

## 2015-08-13 NOTE — Progress Notes (Signed)
Patient ID: Edward Oconnell, male   DOB: December 28, 1945, 69 y.o.   MRN: [id number]     Primary Care Physician: Elsie Stain, MD Referring Physician: Dr. Gust Rung Edward Oconnell is a 69 y.o. male with a h/o persistent afib that f/u's in the afib clinic today for recent loading of amiodarone, started by Dr. Haroldine Laws, which converted pt without cardioversion. He is now on 200 mg of amiodarone and maintaining SR. Dr. B wanted him to have a 30 day monitor which was scheduled for the pt but he forgot appointment. From a heart rhythm standpoint, he does not report any problems. He has developed a URI mostly with sinus symptoms and was seen by PCP yesterday, no treatment needed, other than time will take care of it. CXR was done and normal. He denies any dyspnea or cough. Expecting to get his prosthetic eye next week.  Today, he denies symptoms of palpitations, chest pain, shortness of breath, orthopnea, PND, lower extremity edema, dizziness, presyncope, syncope, or neurologic sequela. The patient is tolerating medications without difficulties and is otherwise without complaint today.   Past Medical History  Diagnosis Date  . CAD (coronary artery disease)   . Hypertension   . Hypothyroidism   . Benign prostatic hypertrophy     a. Nonobstructive on 2004 cath b. anterior STEMI s/p DES-pLAD 12/05/12  . Hyperlipidemia   . Diabetes mellitus without complication ([name])   . Stroke [facility]) 2009  . GERD (gastroesophageal reflux disease)   . Skin cancer   . History of shingles 08/2012    Lt eye shingles  . 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 Creat was 6 when he rec'd a deceased donor transplant Oct 01, 2010 at [facility].  According to patient he did OK then in the first few weeks after surgery had to go back in hospital for "BP" problems that "messed the kidney up". Since then creatinine has been in 2.3-3.0 range.  Sees only [place] nephrologists, gets most care there.  Takes prograf and  Myfortic, no steroids.  PKD kidneys were left in place.    . Chronic kidney disease (CKD), stage IV (severe) ([place]) 12/08/2012    Of renal transplant, done 10-01-10 at [facility]   . Cardiac arrest ([place]) 12/05/2012    In the setting of anterior STEMI  . Altered mental state     with cryptococcal Ag positive on lumbar puncture 2015 at [facility]   Past Surgical History  Procedure Laterality Date  . Kidney transplant      10-01-2010  . Other surgical history  11/21/12    cancerous cells removed from mid upper chest  . Eye surgery    . Other surgical history N/A 11/15/2012    pt had skin cancer removed from mid upper chest  . Coronary angioplasty with stent placement  12/05/2012    30% oLAD, 20% mLAD, 95-99% pLAD s/p DES, 60% oD1, 70-80% pRamus, 40% pLCx, 50% AV groove Cx, 50% PLB, 100% mRCA s/p recanalization intra-procedurally, noted to be small non-dominant with 70% pRCA stenosis post-procedure  . Cardiac catheterization  2004    Nonobstructive  . Facial reconstruction surgery  2015    [facility]  . Left heart catheterization with coronary angiogram N/A 12/05/2012    Procedure: LEFT HEART CATHETERIZATION WITH CORONARY ANGIOGRAM;  Surgeon: Burnell Blanks, MD;  Location: [facility] CATH LAB;  Service: Cardiovascular;  Laterality: N/A;  . Enucleation  2016    R eye  Current Outpatient Prescriptions  Medication Sig Dispense Refill  . amiodarone (PACERONE) 200 MG tablet Take 1 tablet (200 mg total) by mouth daily.    Marland Kitchen aspirin EC 81 MG tablet TAKE 1 TABLET BY MOUTH DAILY.    Marland Kitchen atorvastatin (LIPITOR) 20 MG tablet Take 1 tablet (20 mg total) by mouth daily. 90 tablet 3  . Cholecalciferol (VITAMIN D-1000 MAX ST) 1000 UNITS tablet Take 1,000 Units by mouth.     . clopidogrel (PLAVIX) 75 MG tablet TAKE 1 TABLET (75 MG TOTAL) BY MOUTH DAILY. 30 tablet 3  . diltiazem (CARDIZEM CD) 120 MG 24 hr capsule Take 1 capsule (120 mg total) by mouth daily.    . fluconazole (DIFLUCAN) 200 MG tablet Take 100 mg by mouth daily.     .  furosemide (LASIX) 20 MG tablet Take 20 mg by mouth.    . hydrALAZINE (APRESOLINE) 25 MG tablet Take 75 mg by mouth 3 (three) times daily.    Marland Kitchen levothyroxine (SYNTHROID, LEVOTHROID) 125 MCG tablet Take by mouth.    . magnesium oxide (MAG-OX) 400 MG tablet Take 400 mg by mouth 3 (three) times daily.     . metoprolol tartrate (LOPRESSOR) 100 MG tablet Take 1 tablet (100 mg total) by mouth 2 (two) times daily. 60 tablet 6  . nitroGLYCERIN (NITROSTAT) 0.4 MG SL tablet Place 1 tablet (0.4 mg total) under the tongue every 5 (five) minutes x 3 doses as needed for chest pain. 25 tablet 3  . pantoprazole (PROTONIX) 40 MG tablet TAKE 1 TABLET (40 MG TOTAL) BY MOUTH DAILY. 30 tablet 11  . predniSONE (DELTASONE) 10 MG tablet Take 1 tablet (10 mg total) by mouth daily with breakfast.    . sertraline (ZOLOFT) 100 MG tablet Take 100 mg by mouth daily.     . sodium bicarbonate 650 MG tablet Take 650 mg by mouth 3 (three) times daily.     . vitamin B-12 (CYANOCOBALAMIN) 1000 MCG tablet Take 1,000 mcg by mouth.     No current facility-administered medications for this encounter.    No Known Allergies  Social History   Social History  . Marital Status: Married    Spouse Name: N/A  . Number of Children: N/A  . Years of Education: N/A   Occupational History  . 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 Smoking status: Never Smoker   . Smokeless tobacco: Never Used  . Alcohol Use: No  . Drug Use: No  . Sexual Activity: Yes   Other Topics Concern  . Not on file   Social History Narrative   Running lawn care service, part time.     Married 1966    Family History  Problem Relation Age of Onset  . Kidney failure Mother     was on dialysis 3 times a week when she died  . Alcohol abuse Mother   . Hypertension Father   . Heart attack Sister   . Kidney disease Sister   . Aneurysm Sister   . COPD Sister   . Colon  cancer Neg Hx   . Prostate cancer Neg Hx     ROS- All systems are reviewed and negative except as per the HPI above  Physical Exam: Filed Vitals:   08/13/15 1113  BP: 138/88  Pulse: 57  Height: '5\' 8"'$  (1.727 m)  Weight: 163 lb 9.6 oz (  74.208 kg)    GEN- The patient is well appearing, alert and oriented x 3 today.   Head- normocephalic, atraumatic Eyes-  Sclera clear, conjunctiva pink Ears- hearing intact Oropharynx- clear Neck- supple, no JVP Lymph- no cervical lymphadenopathy Lungs- Clear to ausculation bilaterally, normal work of breathing, few scattered rhonchi Heart- Regular rate and rhythm, no murmurs, rubs or gallops, PMI not laterally displaced GI- soft, NT, ND, + BS Extremities- no clubbing, cyanosis, or edema MS- no significant deformity or atrophy Skin- no rash or lesion Psych- euthymic mood, full affect Neuro- strength and sensation are intact  EKG-SB 57 bpm, pr int 148 ms, QRS int 78 ms,QTc 486 ms  Epic records reviewed  CXR 11/30 FINDINGS: The lungs are adequately inflated and clear. The heart and pulmonary vascularity are normal. The mediastinum is normal in width. There is no pleural effusion. There is stable apical pleural thickening bilaterally. The bony thorax is unremarkable.  IMPRESSION: There is no active cardiopulmonary disease.  Assessment and Plan: 1. afib In SR on amiodarone 200 mg a day Previously not on anticoagulation due to h/o falls and labile INR's, with chadsvasc score of 5  2. CAD  stable Continue plavix, asa,statin,bb  3. CKD  Most recent creat 2.66 drawn by PCP 11/30  4. HTN Stable  5. Cold symptoms Checked by PCP yesterday and felt like no antibiotic is needed  F/u Dr. [name] 1/9 Afib clinic as needed  Butch Penny C. Izel Eisenhardt, [facility] [address] [place], [place] [address] [phone]

## 2015-08-16 ENCOUNTER — Other Ambulatory Visit: Payer: Self-pay | Admitting: Family Medicine

## 2015-08-16 DIAGNOSIS — Q613 Polycystic kidney, unspecified: Secondary | ICD-10-CM

## 2015-08-20 ENCOUNTER — Other Ambulatory Visit (INDEPENDENT_AMBULATORY_CARE_PROVIDER_SITE_OTHER): Payer: Medicare Other

## 2015-08-20 DIAGNOSIS — Q613 Polycystic kidney, unspecified: Secondary | ICD-10-CM | POA: Diagnosis not present

## 2015-08-20 DIAGNOSIS — E039 Hypothyroidism, unspecified: Secondary | ICD-10-CM | POA: Diagnosis not present

## 2015-08-20 LAB — TSH: TSH: 4.72 u[IU]/mL — AB (ref 0.35–4.50)

## 2015-08-20 LAB — BASIC METABOLIC PANEL
BUN: 30 mg/dL — ABNORMAL HIGH (ref 6–23)
CHLORIDE: 99 meq/L (ref 96–112)
CO2: 28 meq/L (ref 19–32)
CREATININE: 2.17 mg/dL — AB (ref 0.40–1.50)
Calcium: 9.6 mg/dL (ref 8.4–10.5)
GFR: 32.16 mL/min — ABNORMAL LOW (ref >60.00)
GLUCOSE: 197 mg/dL — AB (ref 70–99)
Potassium: 4.2 mEq/L (ref 3.5–5.1)
Sodium: 138 mEq/L (ref 135–145)

## 2015-08-24 DIAGNOSIS — L570 Actinic keratosis: Secondary | ICD-10-CM | POA: Diagnosis not present

## 2015-08-24 DIAGNOSIS — D0461 Carcinoma in situ of skin of right upper limb, including shoulder: Secondary | ICD-10-CM | POA: Diagnosis not present

## 2015-08-24 DIAGNOSIS — Z85828 Personal history of other malignant neoplasm of skin: Secondary | ICD-10-CM | POA: Diagnosis not present

## 2015-08-24 DIAGNOSIS — X32XXXA Exposure to sunlight, initial encounter: Secondary | ICD-10-CM | POA: Diagnosis not present

## 2015-08-24 DIAGNOSIS — D485 Neoplasm of uncertain behavior of skin: Secondary | ICD-10-CM | POA: Diagnosis not present

## 2015-08-24 DIAGNOSIS — C44519 Basal cell carcinoma of skin of other part of trunk: Secondary | ICD-10-CM | POA: Diagnosis not present

## 2015-08-24 DIAGNOSIS — D0462 Carcinoma in situ of skin of left upper limb, including shoulder: Secondary | ICD-10-CM | POA: Diagnosis not present

## 2015-09-02 ENCOUNTER — Other Ambulatory Visit: Payer: Self-pay | Admitting: Physician Assistant

## 2015-09-04 DIAGNOSIS — E119 Type 2 diabetes mellitus without complications: Secondary | ICD-10-CM | POA: Diagnosis not present

## 2015-09-04 DIAGNOSIS — K219 Gastro-esophageal reflux disease without esophagitis: Secondary | ICD-10-CM | POA: Diagnosis not present

## 2015-09-04 DIAGNOSIS — T85398A Other mechanical complication of other ocular prosthetic devices, implants and grafts, initial encounter: Secondary | ICD-10-CM | POA: Diagnosis not present

## 2015-09-04 DIAGNOSIS — N189 Chronic kidney disease, unspecified: Secondary | ICD-10-CM | POA: Diagnosis not present

## 2015-09-04 DIAGNOSIS — T8131XA Disruption of external operation (surgical) wound, not elsewhere classified, initial encounter: Secondary | ICD-10-CM | POA: Diagnosis not present

## 2015-09-04 DIAGNOSIS — G709 Myoneural disorder, unspecified: Secondary | ICD-10-CM | POA: Diagnosis not present

## 2015-09-04 DIAGNOSIS — I2510 Atherosclerotic heart disease of native coronary artery without angina pectoris: Secondary | ICD-10-CM | POA: Diagnosis not present

## 2015-09-04 DIAGNOSIS — N159 Renal tubulo-interstitial disease, unspecified: Secondary | ICD-10-CM | POA: Diagnosis not present

## 2015-09-04 DIAGNOSIS — Z79899 Other long term (current) drug therapy: Secondary | ICD-10-CM | POA: Diagnosis not present

## 2015-09-04 DIAGNOSIS — I129 Hypertensive chronic kidney disease with stage 1 through stage 4 chronic kidney disease, or unspecified chronic kidney disease: Secondary | ICD-10-CM | POA: Diagnosis not present

## 2015-09-04 DIAGNOSIS — Z940 Kidney transplant status: Secondary | ICD-10-CM | POA: Diagnosis not present

## 2015-09-04 DIAGNOSIS — Z8673 Personal history of transient ischemic attack (TIA), and cerebral infarction without residual deficits: Secondary | ICD-10-CM | POA: Diagnosis not present

## 2015-09-04 DIAGNOSIS — Z951 Presence of aortocoronary bypass graft: Secondary | ICD-10-CM | POA: Diagnosis not present

## 2015-09-04 DIAGNOSIS — Z9119 Patient's noncompliance with other medical treatment and regimen: Secondary | ICD-10-CM | POA: Diagnosis not present

## 2015-09-04 DIAGNOSIS — E039 Hypothyroidism, unspecified: Secondary | ICD-10-CM | POA: Diagnosis not present

## 2015-09-21 ENCOUNTER — Ambulatory Visit: Payer: Medicare Other | Admitting: Cardiovascular Disease

## 2015-09-22 NOTE — Progress Notes (Signed)
Chief Complaint  Patient presents with  . Follow-up  . Coronary Artery Disease    PT HAS NO COMPLAINTS  . Atrial Fibrillation     History of Present Illness: 70 yo male with history of CAD, chronic stage III kidney disease s/p kidney transplant, CVA and newly diagnosed atrial fibrillation who is here today for cardiac follow up. He was admitted to [facility] 12/05/12 with anterior STEMI complicated by ventricular fibrillation and cardiogenic shock. A drug eluting eluting stent (3.0 x 38 mm Promus Premier post-dilated with a 3.5 Upper [place] balloon) was placed in the LAD. The small non-dominant RCA was occluded at beginning of case and reperfused with medical therapy during case. An IABP was placed and he required support with pressor agents for 48 hours post cath. He was placed on amiodarone but this was stopped before discharge secondary to bradycardia. Echo 12/06/12 with preserved LV systolic function. Cath also showed a moderately severe stenosis in a moderate caliber intermediate branch which we chose to manage medically. I arranged a stress myoview after his August 2014 appt but he cancelled. He was admitted to [facility] November 2014 with N/V, diarrhea, confusion, gait instability after radiation therapy to right cheek. Negative hypercoagulable workup. Vasculitis labs negative. 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 TEE without left atrial thrombus. 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 Event monitor 09/27/13  showed atrial fibrillation. He discussed anticoagulation with the Neurology clinic at [facility], MD) but wanted to hold off on anti-coagulation until he was seen in our office. I saw him in February 2015 and he was started on coumadin, ASA was held and Brilinta was replaced with Plavix. He came in for INR check in early March in our office and fell, hit his head and had a  high INR. Coumadin was stopped and ASA restarted. The decision was made not to restart anti-coagulation. Admitted to [facility] March 2015 for CNS fungal infection, treated with Diflucan. [facility]itis and subsequent atrial fib with RVR. He has been in sinus on amiodarone. He has been seen in the atrial fib clinic and released.   He is here today for follow up. No chest pain or SOB. No awareness of irregularity of his heart rhythm. No LE edema.   Primary Care Physician: Elsie Stain  [facility]phrology: Dr. Dayle Points at [facility] ENT: Dr. Colin Benton [facility]) Neurology: [facility]) [place]  Last Lipid Profile:Lipid Panel     Component Value Date/Time   CHOL 154 02/18/2015 0135   TRIG 209* 02/18/2015 0135   HDL 33* 02/18/2015 0135   CHOLHDL 4.7 02/18/2015 0135   VLDL 42* 02/18/2015 0135   LDLCALC 79 02/18/2015 0135     Past Medical History  Diagnosis Date  . CAD (coronary artery disease)   . Hypertension   . Hypothyroidism   . Benign prostatic hypertrophy     a. Nonobstructive on 2004 cath b. anterior STEMI s/p DES-pLAD 12/05/12  . Hyperlipidemia   . Diabetes mellitus without complication ([place])   . Stroke [facility]) 2009  . GERD (gastroesophageal reflux disease)   . Skin cancer   . History of shingles 08/2012    Lt eye shingles  . 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 Creat was 6 when he rec'd a deceased donor transplant 10-16-2010 at [facility].  According to patient he did OK  then in the first few weeks after surgery had to go back in hospital for "BP" problems that "messed the kidney up". Since then creatinine has been in 2.3-3.0 range.  Sees only [place] nephrologists, gets most care there.  Takes prograf and Myfortic, no steroids.  PKD kidneys were left in place.    . Chronic kidney disease (CKD), stage IV (severe) ([place]) 12/08/2012    Of renal transplant, done Jan 2012 at [facility]   . Cardiac arrest ([place]) 12/05/2012    In the setting of  anterior STEMI  . Altered mental state     with cryptococcal Ag positive on lumbar puncture 2015 at [facility]    Past Surgical History  Procedure Laterality Date  . Kidney transplant      09/2010  . Other surgical history  11/21/12    cancerous cells removed from mid upper chest  . Eye surgery    . Other surgical history N/A 11/15/2012    pt had skin cancer removed from mid upper chest  . Coronary angioplasty with stent placement  12/05/2012    30% oLAD, 20% mLAD, 95-99% pLAD s/p DES, 60% oD1, 70-80% pRamus, 40% pLCx, 50% AV groove Cx, 50% PLB, 100% mRCA s/p recanalization intra-procedurally, noted to be small non-dominant with 70% pRCA stenosis post-procedure  . Cardiac catheterization  2004    Nonobstructive  . Facial reconstruction surgery  2015    [facility]  . Left heart catheterization with coronary angiogram N/A 12/05/2012    Procedure: LEFT HEART CATHETERIZATION WITH CORONARY ANGIOGRAM;  Surgeon: Burnell Blanks, MD;  Location: [facility] CATH LAB;  Service: Cardiovascular;  Laterality: N/A;  . Enucleation  2016    R eye    Current Outpatient Prescriptions  Medication Sig Dispense Refill  . amiodarone (PACERONE) 200 MG tablet Take 1 tablet (200 mg total) by mouth daily. (Patient taking differently: Take 400 mg by mouth 2 (two) times daily. )    . aspirin EC 81 MG tablet TAKE 1 TABLET BY MOUTH DAILY.    Marland Kitchen atorvastatin (LIPITOR) 20 MG tablet Take 1 tablet (20 mg total) by mouth daily. 90 tablet 3  . Cholecalciferol (VITAMIN D-1000 MAX ST) 1000 UNITS tablet Take 1,000 Units by mouth.     . clopidogrel (PLAVIX) 75 MG tablet TAKE 1 TABLET (75 MG TOTAL) BY MOUTH DAILY. 30 tablet 3  . diltiazem (CARDIZEM CD) 120 MG 24 hr capsule Take 1 capsule (120 mg total) by mouth daily.    Marland Kitchen diltiazem (CARDIZEM CD) 120 MG 24 hr capsule TAKE 1 CAPSULE (120 MG TOTAL) BY MOUTH DAILY. 30 capsule 6  . doxycycline (VIBRA-TABS) 100 MG tablet Take 100 mg by mouth 2 (two) times daily.    Marland Kitchen esomeprazole (NEXIUM) 40 MG  capsule Take 40 mg by mouth daily.    . Ferrous Sulfate 134 MG TABS Take 1 tablet by mouth daily.    . fluconazole (DIFLUCAN) 200 MG tablet Take 100 mg by mouth daily.     . furosemide (LASIX) 20 MG tablet Take 20 mg by mouth.    . hydrALAZINE (APRESOLINE) 25 MG tablet Take 75 mg by mouth 3 (three) times daily.    Marland Kitchen levothyroxine (SYNTHROID, LEVOTHROID) 125 MCG tablet Take by mouth.    . losartan (COZAAR) 25 MG tablet Take 25 mg by mouth daily.    . magnesium oxide (MAG-OX) 400 MG tablet Take 400 mg by mouth 3 (three) times daily.     . metoprolol (LOPRESSOR) 100 MG tablet TAKE 1  TABLET (100 MG TOTAL) BY MOUTH 2 (TWO) TIMES DAILY. 60 tablet 6  . metoprolol succinate (TOPROL-XL) 25 MG 24 hr tablet Take 25 mg by mouth daily.    . nitroGLYCERIN (NITROSTAT) 0.4 MG SL tablet Place 1 tablet (0.4 mg total) under the tongue every 5 (five) minutes x 3 doses as needed for chest pain. 25 tablet 3  . pantoprazole (PROTONIX) 40 MG tablet TAKE 1 TABLET (40 MG TOTAL) BY MOUTH DAILY. 30 tablet 11  . predniSONE (DELTASONE) 10 MG tablet Take 1 tablet (10 mg total) by mouth daily with breakfast.    . ranitidine (ZANTAC) 150 MG capsule Take 150 mg by mouth daily.    . sertraline (ZOLOFT) 100 MG tablet Take 100 mg by mouth daily.     . sodium bicarbonate 650 MG tablet Take 650 mg by mouth 3 (three) times daily.     . vitamin B-12 (CYANOCOBALAMIN) 1000 MCG tablet Take 1,000 mcg by mouth.     No current facility-administered medications for this visit.    No Known Allergies  Social History   Social History  . Marital Status: Married    Spouse Name: N/A  . Number of Children: N/A  . Years of Education: N/A   Occupational History  . 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 Smoking status: Never Smoker   . Smokeless tobacco: Never Used  . Alcohol Use: No  . Drug Use: No  . Sexual Activity: Yes   Other Topics Concern  .  Not on file   Social History Narrative   Running lawn care service, part time.     Married 1966    Family History  Problem Relation Age of Onset  . Kidney failure Mother     was on dialysis 3 times a week when she died  . Alcohol abuse Mother   . Hypertension Father   . Heart attack Sister   . Kidney disease Sister   . Aneurysm Sister   . COPD Sister   . Colon cancer Neg Hx   . Prostate cancer Neg Hx     Review of Systems:  As stated in the HPI and otherwise negative.   BP 130/90 mmHg  Pulse 60  Ht '5\' 8"'$  (1.727 m)  Wt 168 lb (76.204 kg)  BMI 25.55 kg/m2  SpO2 97%  Physical Examination: General: Well developed, well nourished, NAD HEENT: OP clear, mucus membranes moist SKIN: warm, dry. No rashes. Neuro: No focal deficits Musculoskeletal: Muscle strength 5/5 all ext Psychiatric: Mood and affect normal Neck: No JVD, no carotid bruits, no thyromegaly, no lymphadenopathy. Lungs:Clear bilaterally, no wheezes, rhonci, crackles Cardiovascular: Regular rate and rhythm. No murmurs, gallops or rubs. Abdomen:Soft. Bowel sounds present. Non-tender.  Extremities: No lower extremity edema. Pulses are 2 + in the bilateral DP/PT.  Cardiac cath 12/05/12:  Left main: 30% ostial stenosis. 20% mid stenosis.  Left Anterior Descending Artery: Large caliber vessel that courses to the apex. The proximal vessel is hazy with diffuse 95-99% stenosis extending from the ostium down to the first diagonal branch. The remainder of the LAD has no flow limiting disease. The diagonal branch is patent ostial 60% stenosis.  Ramus Intermediate: Moderate caliber vessel with 70-80% proximal stenosis.  Circumflex Artery: Large caliber, dominant vessel with 40% proximal stenosis. The first obtuse marginal branch is small in caliber with no obstructive disease. The  second obtuse marginal branch is small with no disease. The distal AV groove Circumflex has a focal 50% stenosis. The left posterolateral branch has  50% stenosis.  Right Coronary Artery: Small caliber (1.75 mm) non-dominant vessel with 100% mid occlusion. (This vessel recanalized during the procedure and at the end of the procedure was noted to be small, non-dominant with a proximal 70% stenosis.   Echo 12/06/12:  Left ventricle: The cavity size was normal. Wall thickness was increased in a pattern of mild LVH. Systolic function was normal. The estimated ejection fraction was in the range of 60% to 65%. Wall motion was normal; there were no regional wall motion abnormalities. - Right ventricle: The cavity size was mildly dilated. Systolic function was mildly reduced.  EKG:  EKG is not ordered today. The ekg ordered today demonstrates   Recent Labs: 02/17/2015: Magnesium 1.9 07/31/2015: ALT 22 08/12/2015: Hemoglobin 13.6; Platelets 243.0 08/20/2015: BUN 30*; Creatinine, Ser 2.17*; Potassium 4.2; Sodium 138; TSH 4.72*   Lipid Panel    Component Value Date/Time   CHOL 154 02/18/2015 0135   TRIG 209* 02/18/2015 0135   HDL 33* 02/18/2015 0135   CHOLHDL 4.7 02/18/2015 0135   VLDL 42* 02/18/2015 0135   LDLCALC 79 02/18/2015 0135   LDLDIRECT 103.0 01/21/2015 1432     Wt Readings from Last 3 Encounters:  09/23/15 168 lb (76.204 kg)  08/13/15 163 lb 9.6 oz (74.208 kg)  08/12/15 162 lb 8 oz (73.71 kg)     Other studies Reviewed: Additional studies/ records that were reviewed today include: . Review of the above records demonstrates:   Assessment and Plan:   1. CAD: Stable. No recent chest pain. Anterior STEMI March 0459 complicated by incessant VF and cardiogenic shock s/p DES x 1 proximal LAD. Small non-dominant RCA was occluded at beginning of case and reperfused with medical therapy during case. Moderate disease in intermediate branch which we have managed medically. Echo 12/06/12 with preserved LV systolic function. He is now on ASA and Plavix. Continue statin, beta blocker.  2. Chronic kidney disease, stage III s/p kidney  transplant: Stable. Followed in Nephrology.   3. HTN: BP controlled. No changes today.   4. Paroxysmal, atrial fibrillation: Sinus today. He is only on ASA. Coumadin was tried but we could not control his INR. He understands risk of CVA with recurrent a. Fib but given multiple complex issues, we will not start anti-coagulation.   5. Hyperlipidemia: Continue statin. LDL controlled.   Current medicines are reviewed at length with the patient today.  The patient does not have concerns regarding medicines.  The following changes have been made:  no change  Labs/ tests ordered today include:  No orders of the defined types were placed in this encounter.    Disposition:   FU with me in 6 months   Signed, Lauree Chandler, MD 09/23/2015 1:36 PM    [facility] [address], [place], [place]  [address] Phone: [phone]; Fax: [phone]

## 2015-09-23 ENCOUNTER — Ambulatory Visit (INDEPENDENT_AMBULATORY_CARE_PROVIDER_SITE_OTHER): Payer: Medicare Other | Admitting: Cardiovascular Disease

## 2015-09-23 ENCOUNTER — Encounter: Payer: Self-pay | Admitting: Cardiovascular Disease

## 2015-09-23 VITALS — BP 130/90 | HR 60 | Ht 68.0 in | Wt 168.0 lb

## 2015-09-23 DIAGNOSIS — E785 Hyperlipidemia, unspecified: Secondary | ICD-10-CM

## 2015-09-23 DIAGNOSIS — I10 Essential (primary) hypertension: Secondary | ICD-10-CM | POA: Diagnosis not present

## 2015-09-23 DIAGNOSIS — I480 Paroxysmal atrial fibrillation: Secondary | ICD-10-CM | POA: Diagnosis not present

## 2015-09-23 DIAGNOSIS — I2510 Atherosclerotic heart disease of native coronary artery without angina pectoris: Secondary | ICD-10-CM

## 2015-09-23 NOTE — Patient Instructions (Signed)

## 2015-09-27 ENCOUNTER — Other Ambulatory Visit: Payer: Self-pay | Admitting: Family Medicine

## 2015-09-27 DIAGNOSIS — E1122 Type 2 diabetes mellitus with diabetic chronic kidney disease: Secondary | ICD-10-CM

## 2015-09-27 DIAGNOSIS — E039 Hypothyroidism, unspecified: Secondary | ICD-10-CM

## 2015-09-29 ENCOUNTER — Other Ambulatory Visit (INDEPENDENT_AMBULATORY_CARE_PROVIDER_SITE_OTHER): Payer: Medicare Other

## 2015-09-29 DIAGNOSIS — E1122 Type 2 diabetes mellitus with diabetic chronic kidney disease: Secondary | ICD-10-CM

## 2015-09-29 DIAGNOSIS — E039 Hypothyroidism, unspecified: Secondary | ICD-10-CM

## 2015-09-29 LAB — BASIC METABOLIC PANEL
BUN: 40 mg/dL — AB (ref 6–23)
CHLORIDE: 99 meq/L (ref 96–112)
CO2: 31 meq/L (ref 19–32)
CREATININE: 2.62 mg/dL — AB (ref 0.40–1.50)
Calcium: 9.4 mg/dL (ref 8.4–10.5)
GFR: 25.87 mL/min — ABNORMAL LOW (ref >60.00)
Glucose, Bld: 235 mg/dL — ABNORMAL HIGH (ref 70–99)
Potassium: 4.3 mEq/L (ref 3.5–5.1)
Sodium: 138 mEq/L (ref 135–145)

## 2015-09-29 LAB — HEMOGLOBIN A1C: Hgb A1c MFr Bld: 7.7 % — ABNORMAL HIGH (ref 4.6–6.5)

## 2015-09-29 LAB — TSH: TSH: 5.12 u[IU]/mL — ABNORMAL HIGH (ref 0.35–4.50)

## 2015-10-01 ENCOUNTER — Telehealth: Payer: Self-pay | Admitting: *Deleted

## 2015-10-01 NOTE — Telephone Encounter (Signed)
Pt has been on amiodarone and diltiazem since November 2016. He has been seen in office since then.  I reviewed with Fuller Canada, PharmD and OK for pt to be on both.  I called [facility] and told them OK for pt to continue both medications. When pt was here recently he had both Toprol and Lopressor on his med list. At office visit he did not know which medication he was taking and was going to call us back and let us know.  I confirmed today with [facility] they have been filling Lopressor 100 mg twice daily.  They have not filled Toprol since 2015.  Will update list.  I returned call to pt and left message to call back.

## 2015-10-01 NOTE — Telephone Encounter (Signed)
Patient called and stated that the pharmacist at [facility] indicated that being on both the amiodarone and the diltiazem could cause a reaction. He would like a call back at [phone] to discuss this. Please advise. Thanks, MI

## 2015-10-01 NOTE — Telephone Encounter (Signed)
F/u  Pt returning RN phone call. Please call back and discuss.   

## 2015-10-01 NOTE — Telephone Encounter (Signed)
I spoke with pt and gave him information outlined below.

## 2015-10-03 ENCOUNTER — Other Ambulatory Visit: Payer: Self-pay | Admitting: Family Medicine

## 2015-10-03 DIAGNOSIS — E1122 Type 2 diabetes mellitus with diabetic chronic kidney disease: Secondary | ICD-10-CM

## 2015-10-06 ENCOUNTER — Encounter: Payer: Self-pay | Admitting: *Deleted

## 2015-10-12 DIAGNOSIS — C44519 Basal cell carcinoma of skin of other part of trunk: Secondary | ICD-10-CM | POA: Diagnosis not present

## 2015-10-12 DIAGNOSIS — D0461 Carcinoma in situ of skin of right upper limb, including shoulder: Secondary | ICD-10-CM | POA: Diagnosis not present

## 2015-10-12 DIAGNOSIS — C44622 Squamous cell carcinoma of skin of right upper limb, including shoulder: Secondary | ICD-10-CM | POA: Diagnosis not present

## 2015-10-12 DIAGNOSIS — L905 Scar conditions and fibrosis of skin: Secondary | ICD-10-CM | POA: Diagnosis not present

## 2015-10-12 DIAGNOSIS — D0462 Carcinoma in situ of skin of left upper limb, including shoulder: Secondary | ICD-10-CM | POA: Diagnosis not present

## 2015-10-29 ENCOUNTER — Other Ambulatory Visit: Payer: Self-pay | Admitting: *Deleted

## 2015-10-29 DIAGNOSIS — N184 Chronic kidney disease, stage 4 (severe): Secondary | ICD-10-CM | POA: Diagnosis not present

## 2015-10-29 DIAGNOSIS — Z79899 Other long term (current) drug therapy: Secondary | ICD-10-CM | POA: Diagnosis not present

## 2015-10-29 DIAGNOSIS — E875 Hyperkalemia: Secondary | ICD-10-CM | POA: Diagnosis not present

## 2015-10-29 DIAGNOSIS — I129 Hypertensive chronic kidney disease with stage 1 through stage 4 chronic kidney disease, or unspecified chronic kidney disease: Secondary | ICD-10-CM | POA: Diagnosis not present

## 2015-10-29 DIAGNOSIS — Z940 Kidney transplant status: Secondary | ICD-10-CM | POA: Diagnosis not present

## 2015-10-29 DIAGNOSIS — E872 Acidosis: Secondary | ICD-10-CM | POA: Diagnosis not present

## 2015-10-29 DIAGNOSIS — I2510 Atherosclerotic heart disease of native coronary artery without angina pectoris: Secondary | ICD-10-CM | POA: Diagnosis not present

## 2015-10-29 DIAGNOSIS — E7800 Pure hypercholesterolemia, unspecified: Secondary | ICD-10-CM | POA: Diagnosis not present

## 2015-10-29 DIAGNOSIS — D899 Disorder involving the immune mechanism, unspecified: Secondary | ICD-10-CM | POA: Diagnosis not present

## 2015-10-29 DIAGNOSIS — Z4822 Encounter for aftercare following kidney transplant: Secondary | ICD-10-CM | POA: Diagnosis not present

## 2015-10-29 MED ORDER — CLOPIDOGREL BISULFATE 75 MG PO TABS: ORAL_TABLET | ORAL | Status: DC

## 2015-11-03 DIAGNOSIS — Z8589 Personal history of malignant neoplasm of other organs and systems: Secondary | ICD-10-CM | POA: Diagnosis not present

## 2015-11-03 DIAGNOSIS — C44329 Squamous cell carcinoma of skin of other parts of face: Secondary | ICD-10-CM | POA: Diagnosis not present

## 2015-11-03 DIAGNOSIS — Z923 Personal history of irradiation: Secondary | ICD-10-CM | POA: Diagnosis not present

## 2015-11-03 DIAGNOSIS — Z08 Encounter for follow-up examination after completed treatment for malignant neoplasm: Secondary | ICD-10-CM | POA: Diagnosis not present

## 2015-11-04 ENCOUNTER — Telehealth: Payer: Self-pay | Admitting: Family Medicine

## 2015-11-04 NOTE — Telephone Encounter (Signed)
Patient wanted to report that his blood sugar has been the high of 236 and the low of 173.  The rest has been in between those numbers.  His machine is broken and he won't get another one until next week.

## 2015-11-05 MED ORDER — GLIMEPIRIDE 1 MG PO TABS: 1.0000 mg | ORAL_TABLET | Freq: Every day | ORAL | Status: DC

## 2015-11-05 NOTE — Telephone Encounter (Signed)
Left message with wife to have patient return call.

## 2015-11-05 NOTE — Telephone Encounter (Signed)
I would add on glimepiride '1mg'$  a day in the meantime.  Recheck A1c as planned.  Update me as needed about his sugars.   Make sure to take it with food in the AM.   Thanks.

## 2015-11-05 NOTE — Telephone Encounter (Signed)
Patient advised.

## 2015-11-05 NOTE — Telephone Encounter (Signed)
Pt returned your call.   [phone]

## 2015-11-10 ENCOUNTER — Encounter: Payer: Self-pay | Admitting: Family Medicine

## 2015-11-10 ENCOUNTER — Encounter: Payer: Self-pay | Admitting: *Deleted

## 2015-11-10 LAB — CREATININE, SERUM
ALT: 38
AST: 34 U/L
Creatinine, Ser: 2.21

## 2015-11-12 ENCOUNTER — Encounter: Payer: Self-pay | Admitting: *Deleted

## 2015-11-17 ENCOUNTER — Telehealth: Payer: Self-pay

## 2015-11-17 NOTE — Telephone Encounter (Signed)
Pt request refill on med for heart pt is not sure the name of med; then pt thought might be ntg; pt not having CP now. Pt will ck with pharmacy or cardiology and if any problems will call [facility] back.

## 2015-11-19 ENCOUNTER — Other Ambulatory Visit: Payer: Self-pay | Admitting: *Deleted

## 2015-11-19 MED ORDER — NITROGLYCERIN 0.4 MG SL SUBL: 0.4000 mg | SUBLINGUAL_TABLET | SUBLINGUAL | Status: AC | PRN

## 2015-11-20 ENCOUNTER — Other Ambulatory Visit: Payer: Self-pay | Admitting: Cardiovascular Disease

## 2015-11-27 ENCOUNTER — Telehealth: Payer: Self-pay | Admitting: Family Medicine

## 2015-11-27 NOTE — Telephone Encounter (Signed)
LM for pt to sch AWV on 4/25 at 9am with Katha Cabal, [place]

## 2015-12-03 DIAGNOSIS — F329 Major depressive disorder, single episode, unspecified: Secondary | ICD-10-CM | POA: Diagnosis not present

## 2015-12-03 DIAGNOSIS — I2510 Atherosclerotic heart disease of native coronary artery without angina pectoris: Secondary | ICD-10-CM | POA: Diagnosis not present

## 2015-12-03 DIAGNOSIS — Z940 Kidney transplant status: Secondary | ICD-10-CM | POA: Diagnosis not present

## 2015-12-03 DIAGNOSIS — I480 Paroxysmal atrial fibrillation: Secondary | ICD-10-CM | POA: Diagnosis not present

## 2015-12-03 DIAGNOSIS — Z01818 Encounter for other preprocedural examination: Secondary | ICD-10-CM | POA: Diagnosis not present

## 2015-12-08 DIAGNOSIS — N189 Chronic kidney disease, unspecified: Secondary | ICD-10-CM | POA: Diagnosis not present

## 2015-12-11 ENCOUNTER — Other Ambulatory Visit: Payer: Self-pay | Admitting: Family Medicine

## 2015-12-14 ENCOUNTER — Telehealth: Payer: Self-pay | Admitting: Cardiovascular Disease

## 2015-12-14 NOTE — Telephone Encounter (Deleted)
Error

## 2015-12-18 NOTE — Telephone Encounter (Signed)
Follow Up   Edward Oconnell with [facility] returned the call to discuss surgical clearance

## 2015-12-18 NOTE — Telephone Encounter (Signed)
I spoke with Dr. Durene Fruits and gave him information from Dr. Burt Knack.

## 2015-12-18 NOTE — Telephone Encounter (Signed)
Returned call to Dr Durene Fruits.  He states he had asked pt to contact our office to see if he could hold ASA and Plavix prior to surgery. Dr. Durene Fruits states Dr. [name] had called his office to discuss and left message.  Dr. Durene Fruits is returning the call. Pt is scheduled for surgery of eye socket and eye lid on 4/14.  Surgeon would like for pt to stop ASA 7 days prior and Plavix 5 days prior.  Surgery could be done without stopping but he would prefer pt stop them.  Pt has had recent surgeries in their health system. Dr. Durene Fruits is asking if MD could review chart to see if OK to proceed with scheduled surgery and if OK to hold ASA /Plavix. Will review with Dr. Burt Knack.

## 2015-12-18 NOTE — Telephone Encounter (Signed)
Chart reviewed. The patient has a very complex medical history. I reviewed Dr. Camillia Herter note from January 2017. There is a request to hold aspirin and Plavix prior to eye surgery. After review, I think it is best for the patient to continue on aspirin 81 mg if his bleeding risk is not too high. Stopping both aspirin and Plavix perioperatively will increase his cardiac risk. It is okay to hold Plavix 5 days before surgery and resume when safe after surgery.  Sherren Mocha 12/18/2015 3:52 PM

## 2015-12-23 ENCOUNTER — Other Ambulatory Visit (HOSPITAL_COMMUNITY): Payer: Self-pay | Admitting: Internal Medicine

## 2015-12-25 DIAGNOSIS — Z955 Presence of coronary angioplasty implant and graft: Secondary | ICD-10-CM | POA: Diagnosis not present

## 2015-12-25 DIAGNOSIS — J45909 Unspecified asthma, uncomplicated: Secondary | ICD-10-CM | POA: Diagnosis not present

## 2015-12-25 DIAGNOSIS — Z7982 Long term (current) use of aspirin: Secondary | ICD-10-CM | POA: Diagnosis not present

## 2015-12-25 DIAGNOSIS — Z940 Kidney transplant status: Secondary | ICD-10-CM | POA: Diagnosis not present

## 2015-12-25 DIAGNOSIS — H02112 Cicatricial ectropion of right lower eyelid: Secondary | ICD-10-CM | POA: Diagnosis not present

## 2015-12-25 DIAGNOSIS — Z79899 Other long term (current) drug therapy: Secondary | ICD-10-CM | POA: Diagnosis not present

## 2015-12-25 DIAGNOSIS — K219 Gastro-esophageal reflux disease without esophagitis: Secondary | ICD-10-CM | POA: Diagnosis not present

## 2015-12-25 DIAGNOSIS — E785 Hyperlipidemia, unspecified: Secondary | ICD-10-CM | POA: Diagnosis not present

## 2015-12-25 DIAGNOSIS — E119 Type 2 diabetes mellitus without complications: Secondary | ICD-10-CM | POA: Diagnosis not present

## 2015-12-25 DIAGNOSIS — T85898A Other specified complication of other internal prosthetic devices, implants and grafts, initial encounter: Secondary | ICD-10-CM | POA: Diagnosis not present

## 2015-12-25 DIAGNOSIS — I480 Paroxysmal atrial fibrillation: Secondary | ICD-10-CM | POA: Diagnosis not present

## 2015-12-25 DIAGNOSIS — I2510 Atherosclerotic heart disease of native coronary artery without angina pectoris: Secondary | ICD-10-CM | POA: Diagnosis not present

## 2015-12-25 DIAGNOSIS — Z8673 Personal history of transient ischemic attack (TIA), and cerebral infarction without residual deficits: Secondary | ICD-10-CM | POA: Diagnosis not present

## 2015-12-25 DIAGNOSIS — Z7902 Long term (current) use of antithrombotics/antiplatelets: Secondary | ICD-10-CM | POA: Diagnosis not present

## 2015-12-25 DIAGNOSIS — S01101A Unspecified open wound of right eyelid and periocular area, initial encounter: Secondary | ICD-10-CM | POA: Diagnosis not present

## 2015-12-25 DIAGNOSIS — I10 Essential (primary) hypertension: Secondary | ICD-10-CM | POA: Diagnosis not present

## 2015-12-25 DIAGNOSIS — I252 Old myocardial infarction: Secondary | ICD-10-CM | POA: Diagnosis not present

## 2015-12-25 DIAGNOSIS — R918 Other nonspecific abnormal finding of lung field: Secondary | ICD-10-CM | POA: Diagnosis not present

## 2015-12-25 DIAGNOSIS — T85398D Other mechanical complication of other ocular prosthetic devices, implants and grafts, subsequent encounter: Secondary | ICD-10-CM | POA: Diagnosis not present

## 2015-12-25 DIAGNOSIS — E039 Hypothyroidism, unspecified: Secondary | ICD-10-CM | POA: Diagnosis not present

## 2016-01-05 ENCOUNTER — Other Ambulatory Visit: Payer: Self-pay | Admitting: Primary Care

## 2016-01-05 ENCOUNTER — Ambulatory Visit (INDEPENDENT_AMBULATORY_CARE_PROVIDER_SITE_OTHER): Payer: Medicare Other | Admitting: Primary Care

## 2016-01-05 ENCOUNTER — Other Ambulatory Visit (INDEPENDENT_AMBULATORY_CARE_PROVIDER_SITE_OTHER): Payer: Medicare Other

## 2016-01-05 ENCOUNTER — Telehealth: Payer: Self-pay

## 2016-01-05 VITALS — BP 116/78 | HR 54 | Temp 97.7°F | Ht 68.0 in | Wt 163.8 lb

## 2016-01-05 DIAGNOSIS — E1122 Type 2 diabetes mellitus with diabetic chronic kidney disease: Secondary | ICD-10-CM | POA: Diagnosis not present

## 2016-01-05 DIAGNOSIS — E039 Hypothyroidism, unspecified: Secondary | ICD-10-CM

## 2016-01-05 DIAGNOSIS — I2510 Atherosclerotic heart disease of native coronary artery without angina pectoris: Secondary | ICD-10-CM | POA: Diagnosis not present

## 2016-01-05 DIAGNOSIS — R531 Weakness: Secondary | ICD-10-CM | POA: Diagnosis not present

## 2016-01-05 LAB — CBC WITH DIFFERENTIAL/PLATELET
BASOS PCT: 0.3 % (ref 0.0–3.0)
Basophils Absolute: 0 10*3/uL (ref 0.0–0.1)
EOS PCT: 0.2 % (ref 0.0–5.0)
Eosinophils Absolute: 0 10*3/uL (ref 0.0–0.7)
HCT: 44.3 % (ref 39.0–52.0)
Hemoglobin: 14.7 g/dL (ref 13.0–17.0)
LYMPHS ABS: 1.2 10*3/uL (ref 0.7–4.0)
Lymphocytes Relative: 10 % — ABNORMAL LOW (ref 12.0–46.0)
MCHC: 33.1 g/dL (ref 30.0–36.0)
MCV: 84.1 fl (ref 78.0–100.0)
MONO ABS: 0.7 10*3/uL (ref 0.1–1.0)
Monocytes Relative: 6.3 % (ref 3.0–12.0)
NEUTROS PCT: 83.2 % — AB (ref 43.0–77.0)
Neutro Abs: 9.8 10*3/uL — ABNORMAL HIGH (ref 1.4–7.7)
Platelets: 243 10*3/uL (ref 150.0–400.0)
RBC: 5.28 Mil/uL (ref 4.22–5.81)
RDW: 16.3 % — AB (ref 11.5–15.5)
WBC: 11.8 10*3/uL — ABNORMAL HIGH (ref 4.0–10.5)

## 2016-01-05 LAB — POC URINALSYSI DIPSTICK (AUTOMATED)
Bilirubin, UA: NEGATIVE
Glucose, UA: NEGATIVE
KETONES UA: NEGATIVE
LEUKOCYTES UA: NEGATIVE
NITRITE UA: NEGATIVE
PH UA: 7
PROTEIN UA: NEGATIVE
RBC UA: NEGATIVE
Spec Grav, UA: 1.01
Urobilinogen, UA: NEGATIVE

## 2016-01-05 LAB — BASIC METABOLIC PANEL
BUN: 46 mg/dL — ABNORMAL HIGH (ref 6–23)
CO2: 29 mEq/L (ref 19–32)
CREATININE: 3.18 mg/dL — AB (ref 0.40–1.50)
Calcium: 9.6 mg/dL (ref 8.4–10.5)
Chloride: 96 mEq/L (ref 96–112)
GFR: 20.67 mL/min — AB (ref >60.00)
GLUCOSE: 196 mg/dL — AB (ref 70–99)
Potassium: 4.1 mEq/L (ref 3.5–5.1)
SODIUM: 136 meq/L (ref 135–145)

## 2016-01-05 LAB — POCT GLUCOSE (DEVICE FOR HOME USE): POC Glucose: 195 mg/dl — AB (ref 70–99)

## 2016-01-05 LAB — TSH: TSH: 15.96 u[IU]/mL — AB (ref 0.35–4.50)

## 2016-01-05 LAB — HEMOGLOBIN A1C: Hgb A1c MFr Bld: 7.4 % — ABNORMAL HIGH (ref 4.6–6.5)

## 2016-01-05 MED ORDER — LEVOTHYROXINE SODIUM 150 MCG PO TABS: 150.0000 ug | ORAL_TABLET | Freq: Every day | ORAL | Status: DC

## 2016-01-05 NOTE — Progress Notes (Signed)
Pre visit review using our clinic review tool, if applicable. No additional management support is needed unless otherwise documented below in the visit note. 

## 2016-01-05 NOTE — Telephone Encounter (Signed)
Pt walked in with weakness in arms and legs for months but has worsened. Pt has no CP, SOB,dizziness,H/A or change in speech. Pt had CVA 4 yrs ago but recovered. Pt has appt to see Allie Bossier NP this morning at 9:30 am.

## 2016-01-05 NOTE — Progress Notes (Signed)
Subjective:    Patient ID: Edward Oconnell, male    DOB: [birth year], 70 y.o.   MRN: [id number]  HPI  Edward Oconnell is a 70 year old male with a history of CVA, Cardiac Arrest, Atrial Fibrillation, STEMI, CAD, HTN who presents today with a chief complaint of weakness. His weakness is present to his bilateral upper and lower extremities that had been bothersome for the past 3-4 months.   He has experienced weakness and occasional dizziness with getting out of his bathtub, initially standing after sitting for a prolonged period of time. He fell 1 month ago after tripping over a concrete block. Denies slurred speech, numbness/tingling, chest pain, syncope, dysuria, difficulty urinating. He's checking his BP at home once weekly which ranges 130-140/80's. He had an operation to his right eye 2-3 weeks ago.   Review of Systems  Constitutional: Positive for fatigue. Negative for fever and unexpected weight change.  HENT: Negative for congestion and rhinorrhea.   Eyes: Negative for visual disturbance.  Respiratory: Negative for cough and shortness of breath.   Cardiovascular: Negative for chest pain.  Gastrointestinal: Negative for nausea.  Genitourinary: Positive for frequency. Negative for dysuria, hematuria and discharge.  Neurological: Positive for dizziness and weakness. Negative for numbness and headaches.       Past Medical History  Diagnosis Date  . CAD (coronary artery disease)   . Hypertension   . Hypothyroidism   . Benign prostatic hypertrophy     a. Nonobstructive on 2004 cath b. anterior STEMI s/p DES-pLAD 12/05/12  . Hyperlipidemia   . Diabetes mellitus without complication ([place])   . Stroke [facility]) 2009  . GERD (gastroesophageal reflux disease)   . Skin cancer   . History of shingles 08/2012    Lt eye shingles  . 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 Creat was 6 when he rec'd a deceased donor transplant Sep 28, 2010 at [facility].  According to patient he  did OK then in the first few weeks after surgery had to go back in hospital for "BP" problems that "messed the kidney up". Since then creatinine has been in 2.3-3.0 range.  Sees only [facility], gets most care there.  Takes prograf and Myfortic, no steroids.  PKD kidneys were left in place.    . Chronic kidney disease (CKD), stage IV (severe) ([place]) 12/08/2012    Of renal transplant, done 09-28-2010 at [facility]   . Cardiac arrest ([place]) 12/05/2012    In the setting of anterior STEMI  . 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 Marital Status: Married    Spouse Name: N/A  . Number of Children: N/A  . Years of Education: N/A   Occupational History  . 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 part owner in a [place] Topics  . Smoking status: Never Smoker   . Smokeless tobacco: Never Used  . Alcohol Use: No  . Drug Use: No  . Sexual Activity: Yes   Other Topics Concern  . Not on file   Social History Narrative   Running lawn care service, part time.     Married 1966    Past Surgical History  Procedure Laterality Date  . Kidney transplant      09/28/2010  .  Other surgical history  11/21/12    cancerous cells removed from mid upper chest  . Eye surgery    . Other surgical history N/A 11/15/2012    pt had skin cancer removed from mid upper chest  . Coronary angioplasty with stent placement  12/05/2012    30% oLAD, 20% mLAD, 95-99% pLAD s/p DES, 60% oD1, 70-80% pRamus, 40% pLCx, 50% AV groove Cx, 50% PLB, 100% mRCA s/p recanalization intra-procedurally, noted to be small non-dominant with 70% pRCA stenosis post-procedure  . Cardiac catheterization  2004    Nonobstructive  . Facial reconstruction surgery  2015    [facility]  . Left heart catheterization with coronary angiogram N/A 12/05/2012    Procedure: LEFT HEART CATHETERIZATION WITH CORONARY ANGIOGRAM;  Surgeon:  Burnell Blanks, MD;  Location: [facility] CATH LAB;  Service: Cardiovascular;  Laterality: N/A;  . Enucleation  2016    R eye    Family History  Problem Relation Age of Onset  . Kidney failure Mother     was on dialysis 3 times a week when she died  . Alcohol abuse Mother   . Hypertension Father   . Heart attack Sister   . Kidney disease Sister   . Aneurysm Sister   . COPD Sister   . Colon cancer Neg Hx   . Prostate cancer Neg Hx     No Known Allergies  Current Outpatient Prescriptions on File Prior to Visit  Medication Sig Dispense Refill  . amiodarone (PACERONE) 200 MG tablet Take 1 tablet (200 mg total) by mouth daily. (Patient taking differently: Take 400 mg by mouth 2 (two) times daily. )    . amiodarone (PACERONE) 200 MG tablet TAKE 2 TABLETS (400 MG TOTAL) BY MOUTH 2 (TWO) TIMES DAILY. 120 tablet 2  . aspirin EC 81 MG tablet TAKE 1 TABLET BY MOUTH DAILY.    Marland Kitchen atorvastatin (LIPITOR) 20 MG tablet Take 1 tablet (20 mg total) by mouth daily. 90 tablet 3  . atorvastatin (LIPITOR) 20 MG tablet TAKE 1 TABLET (20 MG TOTAL) BY MOUTH DAILY. 90 tablet 2  . Cholecalciferol (VITAMIN D-1000 MAX ST) 1000 UNITS tablet Take 1,000 Units by mouth.     . clopidogrel (PLAVIX) 75 MG tablet TAKE 1 TABLET (75 MG TOTAL) BY MOUTH DAILY. 90 tablet 3  . diltiazem (CARDIZEM CD) 120 MG 24 hr capsule Take 1 capsule (120 mg total) by mouth daily.    Marland Kitchen diltiazem (CARDIZEM CD) 120 MG 24 hr capsule TAKE 1 CAPSULE (120 MG TOTAL) BY MOUTH DAILY. 30 capsule 6  . doxycycline (VIBRA-TABS) 100 MG tablet Take 100 mg by mouth 2 (two) times daily.    Marland Kitchen esomeprazole (NEXIUM) 40 MG capsule Take 40 mg by mouth daily.    . Ferrous Sulfate 134 MG TABS Take 1 tablet by mouth daily.    . fluconazole (DIFLUCAN) 200 MG tablet Take 100 mg by mouth daily.     . furosemide (LASIX) 20 MG tablet Take 20 mg by mouth.    Marland Kitchen glimepiride (AMARYL) 1 MG tablet Take 1 tablet (1 mg total) by mouth daily with breakfast. 90 tablet 1  .  hydrALAZINE (APRESOLINE) 25 MG tablet Take 75 mg by mouth 3 (three) times daily.    Marland Kitchen levothyroxine (SYNTHROID, LEVOTHROID) 125 MCG tablet Take by mouth.    . levothyroxine (SYNTHROID, LEVOTHROID) 125 MCG tablet TAKE 1 TABLET BY MOUTH EVERY MORNING BEFORE BREAKFAST, EXCEPT TAKE 1 & 1/2 TABS ON SUNDAY 100 tablet 3  .  losartan (COZAAR) 25 MG tablet Take 25 mg by mouth daily.    . magnesium oxide (MAG-OX) 400 MG tablet Take 400 mg by mouth 3 (three) times daily.     . metoprolol (LOPRESSOR) 100 MG tablet TAKE 1 TABLET (100 MG TOTAL) BY MOUTH 2 (TWO) TIMES DAILY. 60 tablet 6  . nitroGLYCERIN (NITROSTAT) 0.4 MG SL tablet Place 1 tablet (0.4 mg total) under the tongue every 5 (five) minutes x 3 doses as needed for chest pain. 25 tablet 5  . pantoprazole (PROTONIX) 40 MG tablet TAKE 1 TABLET (40 MG TOTAL) BY MOUTH DAILY. 30 tablet 11  . predniSONE (DELTASONE) 10 MG tablet Take 1 tablet (10 mg total) by mouth daily with breakfast.    . ranitidine (ZANTAC) 150 MG capsule Take 150 mg by mouth daily.    . sertraline (ZOLOFT) 100 MG tablet Take 100 mg by mouth daily.     . sodium bicarbonate 650 MG tablet Take 650 mg by mouth 3 (three) times daily.     . vitamin B-12 (CYANOCOBALAMIN) 1000 MCG tablet Take 1,000 mcg by mouth.     No current facility-administered medications on file prior to visit.    BP 116/78 mmHg  Pulse 54  Temp(Src) 97.7 F (36.5 C) (Oral)  Ht '5\' 8"'$  (1.727 m)  Wt 163 lb 12.8 oz (74.299 kg)  BMI 24.91 kg/m2  SpO2 98%    Objective:   Physical Exam  Constitutional: He is oriented to person, place, and time. He appears well-nourished.  HENT:  Mouth/Throat: Oropharynx is clear and moist.  Eyes: EOM are normal. Pupils are equal, round, and reactive to light.  Neck: Neck supple.  Cardiovascular: Normal rate and regular rhythm.   Pulmonary/Chest: Effort normal and breath sounds normal. He has no wheezes. He has no rales.  Neurological: He is alert and oriented to person, place, and  time. He has normal reflexes. No cranial nerve deficit.  Right sided facial dropping that is residual from prior stroke. No arm drift, slurred speech. He is alert and oriented.  Skin: Skin is warm and dry.  Psychiatric: He has a normal mood and affect.          Assessment & Plan:  Weakness:  Located to bilateral upper and lower extremities for 3-4 months. Exam today without evidence of acute CVA as his neuro exam is unremarkable. UA today: No leuks, nitrites, blood, glucose. CBG: 195 Will recheck TSH, check CBC, BMP today. HR noted to be in mid 50's which is mostly normal for him. Does not appear acutely ill or toxic. Okay for home discharge. Will await labs prior to treatment. Return precautions provided.

## 2016-01-05 NOTE — Patient Instructions (Signed)
Complete lab work prior to leaving today. I will notify you of your results once received.   Your urine test does not show evidence of infection.  Your blood sugar is not too low.  I will be in touch with you later.   It was a pleasure meeting you!

## 2016-01-06 ENCOUNTER — Other Ambulatory Visit: Payer: Self-pay | Admitting: Family Medicine

## 2016-01-06 DIAGNOSIS — E039 Hypothyroidism, unspecified: Secondary | ICD-10-CM

## 2016-01-13 DIAGNOSIS — C44329 Squamous cell carcinoma of skin of other parts of face: Secondary | ICD-10-CM | POA: Diagnosis not present

## 2016-01-13 DIAGNOSIS — L570 Actinic keratosis: Secondary | ICD-10-CM | POA: Diagnosis not present

## 2016-01-13 DIAGNOSIS — Z85828 Personal history of other malignant neoplasm of skin: Secondary | ICD-10-CM | POA: Diagnosis not present

## 2016-01-13 DIAGNOSIS — D485 Neoplasm of uncertain behavior of skin: Secondary | ICD-10-CM | POA: Diagnosis not present

## 2016-01-13 DIAGNOSIS — X32XXXA Exposure to sunlight, initial encounter: Secondary | ICD-10-CM | POA: Diagnosis not present

## 2016-01-13 DIAGNOSIS — D0461 Carcinoma in situ of skin of right upper limb, including shoulder: Secondary | ICD-10-CM | POA: Diagnosis not present

## 2016-01-13 DIAGNOSIS — C44629 Squamous cell carcinoma of skin of left upper limb, including shoulder: Secondary | ICD-10-CM | POA: Diagnosis not present

## 2016-02-05 DIAGNOSIS — J3489 Other specified disorders of nose and nasal sinuses: Secondary | ICD-10-CM | POA: Diagnosis not present

## 2016-02-05 DIAGNOSIS — C44329 Squamous cell carcinoma of skin of other parts of face: Secondary | ICD-10-CM | POA: Diagnosis not present

## 2016-02-05 DIAGNOSIS — M952 Other acquired deformity of head: Secondary | ICD-10-CM | POA: Diagnosis not present

## 2016-02-05 DIAGNOSIS — J342 Deviated nasal septum: Secondary | ICD-10-CM | POA: Diagnosis not present

## 2016-02-17 DIAGNOSIS — N189 Chronic kidney disease, unspecified: Secondary | ICD-10-CM | POA: Diagnosis not present

## 2016-02-18 ENCOUNTER — Telehealth: Payer: Self-pay | Admitting: Cardiovascular Disease

## 2016-02-18 DIAGNOSIS — R740 Nonspecific elevation of levels of transaminase and lactic acid dehydrogenase [LDH]: Secondary | ICD-10-CM | POA: Diagnosis not present

## 2016-02-18 DIAGNOSIS — Z940 Kidney transplant status: Secondary | ICD-10-CM | POA: Diagnosis not present

## 2016-02-18 DIAGNOSIS — Q612 Polycystic kidney, adult type: Secondary | ICD-10-CM | POA: Diagnosis not present

## 2016-02-18 DIAGNOSIS — I129 Hypertensive chronic kidney disease with stage 1 through stage 4 chronic kidney disease, or unspecified chronic kidney disease: Secondary | ICD-10-CM | POA: Diagnosis not present

## 2016-02-18 DIAGNOSIS — R739 Hyperglycemia, unspecified: Secondary | ICD-10-CM | POA: Diagnosis not present

## 2016-02-18 DIAGNOSIS — Z79899 Other long term (current) drug therapy: Secondary | ICD-10-CM | POA: Diagnosis not present

## 2016-02-18 DIAGNOSIS — R001 Bradycardia, unspecified: Secondary | ICD-10-CM | POA: Diagnosis not present

## 2016-02-18 DIAGNOSIS — T8619 Other complication of kidney transplant: Secondary | ICD-10-CM | POA: Diagnosis not present

## 2016-02-18 DIAGNOSIS — N184 Chronic kidney disease, stage 4 (severe): Secondary | ICD-10-CM | POA: Diagnosis not present

## 2016-02-18 DIAGNOSIS — Z992 Dependence on renal dialysis: Secondary | ICD-10-CM | POA: Diagnosis not present

## 2016-02-18 NOTE — Telephone Encounter (Signed)
New Message  Pt c/o BP issue:  1. What are your last 5 BP readings? 35/71 BP  Heart rate was 42. ( pt wasn't sure of exact numbers)  2. Are you having any other symptoms (ex. Dizziness, headache, blurred vision, passed out)? Pt states his legs are weak and his arms have no strength. He states that he is walking funny.   Pt called states this heart rate is low and his Nephrologist. Dr. Verdis Frederickson at [facility] has stated that he will need a stress test.

## 2016-02-18 NOTE — Telephone Encounter (Signed)
Left message to c/b to discuss concerns

## 2016-02-19 NOTE — Telephone Encounter (Signed)
F/u  Pt returning RN phone call. Please call back and discuss.   

## 2016-02-19 NOTE — Telephone Encounter (Signed)
Agree 

## 2016-02-19 NOTE — Telephone Encounter (Signed)
Returned call to patient -- HR is running slow  -- Nephrologist stopped metoprolol '100mg'$  BID due to low HR - HR was in the 30s  -- after stopping med - increased to 40s, 50s (this AM) - lunchtime today HR 47 -- Patient feels weak - arm & leg weakness -- Patient has a stress test 6/16 in [facility] - requested for report be sent to our office -- BP is doing good.  -- Patient denies chest pain, SOB, palpitations  Advised patient to continue monitoring HR and BP at least once daily or twice if he prefers (AM & PM) and keep track of readings. He can call back next week with these readings.   Message routed to Dr. [name] & Flex provider L. Dorene Ar, NP to review and advise if there are further recommendations.

## 2016-02-20 ENCOUNTER — Other Ambulatory Visit: Payer: Self-pay | Admitting: Cardiovascular Disease

## 2016-02-22 DIAGNOSIS — R740 Nonspecific elevation of levels of transaminase and lactic acid dehydrogenase [LDH]: Secondary | ICD-10-CM | POA: Diagnosis not present

## 2016-02-22 DIAGNOSIS — Z940 Kidney transplant status: Secondary | ICD-10-CM | POA: Diagnosis not present

## 2016-02-22 DIAGNOSIS — Z79899 Other long term (current) drug therapy: Secondary | ICD-10-CM | POA: Diagnosis not present

## 2016-02-22 DIAGNOSIS — R001 Bradycardia, unspecified: Secondary | ICD-10-CM | POA: Diagnosis not present

## 2016-02-22 DIAGNOSIS — I129 Hypertensive chronic kidney disease with stage 1 through stage 4 chronic kidney disease, or unspecified chronic kidney disease: Secondary | ICD-10-CM | POA: Diagnosis not present

## 2016-02-22 DIAGNOSIS — N184 Chronic kidney disease, stage 4 (severe): Secondary | ICD-10-CM | POA: Diagnosis not present

## 2016-02-22 DIAGNOSIS — T8619 Other complication of kidney transplant: Secondary | ICD-10-CM | POA: Diagnosis not present

## 2016-02-22 DIAGNOSIS — R739 Hyperglycemia, unspecified: Secondary | ICD-10-CM | POA: Diagnosis not present

## 2016-02-22 DIAGNOSIS — Q612 Polycystic kidney, adult type: Secondary | ICD-10-CM | POA: Diagnosis not present

## 2016-02-22 DIAGNOSIS — Z992 Dependence on renal dialysis: Secondary | ICD-10-CM | POA: Diagnosis not present

## 2016-02-22 LAB — HEMOGLOBIN A1C: A1c: 6.6

## 2016-02-23 NOTE — Telephone Encounter (Signed)
Agree. cdm 

## 2016-02-26 DIAGNOSIS — N184 Chronic kidney disease, stage 4 (severe): Secondary | ICD-10-CM | POA: Diagnosis not present

## 2016-02-26 DIAGNOSIS — Q612 Polycystic kidney, adult type: Secondary | ICD-10-CM | POA: Diagnosis not present

## 2016-03-04 ENCOUNTER — Encounter: Payer: Self-pay | Admitting: Family Medicine

## 2016-03-08 ENCOUNTER — Telehealth: Payer: Self-pay

## 2016-03-08 ENCOUNTER — Other Ambulatory Visit (INDEPENDENT_AMBULATORY_CARE_PROVIDER_SITE_OTHER): Payer: Medicare Other

## 2016-03-08 DIAGNOSIS — E039 Hypothyroidism, unspecified: Secondary | ICD-10-CM | POA: Diagnosis not present

## 2016-03-08 DIAGNOSIS — B451 Cerebral cryptococcosis: Secondary | ICD-10-CM

## 2016-03-08 LAB — COMPREHENSIVE METABOLIC PANEL
ALK PHOS: 119 U/L — AB (ref 39–117)
ALT: 149 U/L — AB (ref 0–53)
AST: 121 U/L — ABNORMAL HIGH (ref 0–37)
Albumin: 3.6 g/dL (ref 3.5–5.2)
BUN: 25 mg/dL — AB (ref 6–23)
CALCIUM: 9 mg/dL (ref 8.4–10.5)
CO2: 27 mEq/L (ref 19–32)
CREATININE: 2.39 mg/dL — AB (ref 0.40–1.50)
Chloride: 105 mEq/L (ref 96–112)
GFR: 28.73 mL/min — ABNORMAL LOW (ref >60.00)
GLUCOSE: 113 mg/dL — AB (ref 70–99)
POTASSIUM: 4 meq/L (ref 3.5–5.1)
Sodium: 139 mEq/L (ref 135–145)
TOTAL PROTEIN: 6.2 g/dL (ref 6.0–8.3)
Total Bilirubin: 0.5 mg/dL (ref 0.2–1.2)

## 2016-03-08 LAB — TSH: TSH: 33.47 u[IU]/mL — AB (ref 0.35–4.50)

## 2016-03-08 NOTE — Telephone Encounter (Signed)
Note left at front desk; BS is between 102-130. Should pt continue checking BS daily? Pt request cb. CMP was collected 03/08/16.

## 2016-03-09 NOTE — Telephone Encounter (Signed)
See lab results.  I would check sugar if feeling sx of hypoglycemia and a few (2-3) times a week in the AM, just to make sure he is still stable.  Last A1c was good.  Thanks.

## 2016-03-09 NOTE — Telephone Encounter (Signed)
Patient notified as instructed by telephone and verbalized understanding. Lab results given.

## 2016-03-10 ENCOUNTER — Telehealth: Payer: Self-pay | Admitting: *Deleted

## 2016-03-10 ENCOUNTER — Other Ambulatory Visit: Payer: Self-pay | Admitting: Family Medicine

## 2016-03-10 DIAGNOSIS — E039 Hypothyroidism, unspecified: Secondary | ICD-10-CM

## 2016-03-10 MED ORDER — LEVOTHYROXINE SODIUM 150 MCG PO TABS: 150.0000 ug | ORAL_TABLET | Freq: Every day | ORAL | Status: DC

## 2016-03-10 NOTE — Telephone Encounter (Signed)
Lab results faxed to [facility], Dr. Shea Stakes per Dr. Damita Dunnings.  [phone]

## 2016-03-12 ENCOUNTER — Other Ambulatory Visit (HOSPITAL_COMMUNITY): Payer: Self-pay | Admitting: Internal Medicine

## 2016-03-19 ENCOUNTER — Emergency Department: Payer: Medicare Other

## 2016-03-19 ENCOUNTER — Ambulatory Visit (HOSPITAL_COMMUNITY)
Admission: AD | Admit: 2016-03-19 | Discharge: 2016-03-19 | Disposition: A | Discharge status: Home / Self Care | Payer: Medicare Other | Source: Other Acute Inpatient Hospital | Attending: Emergency Medicine | Admitting: Emergency Medicine

## 2016-03-19 ENCOUNTER — Emergency Department
Admission: EM | Admit: 2016-03-19 | Discharge: 2016-03-19 | Disposition: A | Discharge status: Other Acute Inpatient Hospital | Payer: Medicare Other | Attending: Emergency Medicine | Admitting: Emergency Medicine

## 2016-03-19 DIAGNOSIS — R42 Dizziness and giddiness: Secondary | ICD-10-CM | POA: Diagnosis not present

## 2016-03-19 DIAGNOSIS — I2510 Atherosclerotic heart disease of native coronary artery without angina pectoris: Secondary | ICD-10-CM | POA: Insufficient documentation

## 2016-03-19 DIAGNOSIS — E039 Hypothyroidism, unspecified: Secondary | ICD-10-CM

## 2016-03-19 DIAGNOSIS — Z85828 Personal history of other malignant neoplasm of skin: Secondary | ICD-10-CM | POA: Insufficient documentation

## 2016-03-19 DIAGNOSIS — R531 Weakness: Secondary | ICD-10-CM | POA: Diagnosis present

## 2016-03-19 DIAGNOSIS — E1122 Type 2 diabetes mellitus with diabetic chronic kidney disease: Secondary | ICD-10-CM | POA: Insufficient documentation

## 2016-03-19 DIAGNOSIS — Z87438 Personal history of other diseases of male genital organs: Secondary | ICD-10-CM | POA: Diagnosis not present

## 2016-03-19 DIAGNOSIS — Z8673 Personal history of transient ischemic attack (TIA), and cerebral infarction without residual deficits: Secondary | ICD-10-CM | POA: Insufficient documentation

## 2016-03-19 DIAGNOSIS — I4891 Unspecified atrial fibrillation: Secondary | ICD-10-CM | POA: Insufficient documentation

## 2016-03-19 DIAGNOSIS — I120 Hypertensive chronic kidney disease with stage 5 chronic kidney disease or end stage renal disease: Secondary | ICD-10-CM | POA: Insufficient documentation

## 2016-03-19 DIAGNOSIS — F329 Major depressive disorder, single episode, unspecified: Secondary | ICD-10-CM | POA: Diagnosis not present

## 2016-03-19 DIAGNOSIS — J989 Respiratory disorder, unspecified: Secondary | ICD-10-CM | POA: Insufficient documentation

## 2016-03-19 DIAGNOSIS — R0602 Shortness of breath: Secondary | ICD-10-CM | POA: Diagnosis not present

## 2016-03-19 DIAGNOSIS — I639 Cerebral infarction, unspecified: Secondary | ICD-10-CM | POA: Diagnosis not present

## 2016-03-19 DIAGNOSIS — Z7982 Long term (current) use of aspirin: Secondary | ICD-10-CM | POA: Diagnosis not present

## 2016-03-19 DIAGNOSIS — Z79899 Other long term (current) drug therapy: Secondary | ICD-10-CM | POA: Insufficient documentation

## 2016-03-19 DIAGNOSIS — R5381 Other malaise: Secondary | ICD-10-CM | POA: Diagnosis not present

## 2016-03-19 DIAGNOSIS — N185 Chronic kidney disease, stage 5: Secondary | ICD-10-CM | POA: Insufficient documentation

## 2016-03-19 LAB — BASIC METABOLIC PANEL
Anion gap: 10 (ref 5–15)
BUN: 28 mg/dL — AB (ref 6–20)
CHLORIDE: 103 mmol/L (ref 101–111)
CO2: 23 mmol/L (ref 22–32)
CREATININE: 2.71 mg/dL — AB (ref 0.61–1.24)
Calcium: 9.3 mg/dL (ref 8.9–10.3)
GFR calc Af Amer: 26 mL/min — ABNORMAL LOW (ref >60)
GFR calc non Af Amer: 22 mL/min — ABNORMAL LOW (ref >60)
GLUCOSE: 141 mg/dL — AB (ref 65–99)
POTASSIUM: 4.7 mmol/L (ref 3.5–5.1)
SODIUM: 136 mmol/L (ref 135–145)

## 2016-03-19 LAB — URINALYSIS COMPLETE WITH MICROSCOPIC (ARMC ONLY)
BILIRUBIN URINE: NEGATIVE
GLUCOSE, UA: NEGATIVE mg/dL
HGB URINE DIPSTICK: NEGATIVE
Ketones, ur: NEGATIVE mg/dL
LEUKOCYTES UA: NEGATIVE
Nitrite: NEGATIVE
Protein, ur: 30 mg/dL — AB
Specific Gravity, Urine: 1.014 (ref 1.005–1.030)
pH: 7 (ref 5.0–8.0)

## 2016-03-19 LAB — TROPONIN I

## 2016-03-19 LAB — CBC
HEMATOCRIT: 44.7 % (ref 40.0–52.0)
Hemoglobin: 15.1 g/dL (ref 13.0–18.0)
MCH: 28.1 pg (ref 26.0–34.0)
MCHC: 33.7 g/dL (ref 32.0–36.0)
MCV: 83.4 fL (ref 80.0–100.0)
PLATELETS: 195 10*3/uL (ref 150–440)
RBC: 5.36 MIL/uL (ref 4.40–5.90)
RDW: 16.2 % — AB (ref 11.5–14.5)
WBC: 8 10*3/uL (ref 3.8–10.6)

## 2016-03-19 LAB — TSH: TSH: 44.609 u[IU]/mL — AB (ref 0.350–4.500)

## 2016-03-19 LAB — GLUCOSE, CAPILLARY: GLUCOSE-CAPILLARY: 141 mg/dL — AB (ref 65–99)

## 2016-03-19 NOTE — ED Notes (Addendum)
Pt presents to ED with c/o increasing weakness for the past 2 weeks with intermittent dizziness and sob. Pt has been seen by his pcp for the same last week but he and his wife are not happy with their care and would like a second opinion. Labs drawn at that time and pt medications changed with no improvement. Pt states "I just don't feel well. I dont even want to walk around." wife states "he just sits and sleeps".

## 2016-03-19 NOTE — ED Provider Notes (Addendum)
[facility] Emergency Department Provider Note   ____________________________________________  Time seen: Approximately 330 PM  I have reviewed the triage vital signs and the nursing notes.   HISTORY  Chief Complaint Weakness; Dizziness; and Shortness of Breath   HPI Edward Oconnell is a 70 y.o. male with a history of hypothyroidism as well as a kidney transplant in 2012 was presenting to the emergency department today with 2 weeks of worsening weakness, shortness of breath as well as dizziness. He says that he recently had blood work checked in late June and had his Synthroid level increased. However, it has not helped his current complaints. He says he is only able to take several steps before getting weak and needed to sit down. He denies any fever. Denies any cough or pain. Denies any burning with urination. He sees transplant at [facility].   Past Medical History  Diagnosis Date  . CAD (coronary artery disease)   . Hypertension   . Hypothyroidism   . Benign prostatic hypertrophy     a. Nonobstructive on 2004 cath b. anterior STEMI s/p DES-pLAD 12/05/12  . Hyperlipidemia   . Diabetes mellitus without complication ([place])   . Stroke [facility]) 2009  . GERD (gastroesophageal reflux disease)   . Skin cancer   . History of shingles 08/2012    Lt eye shingles  . 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 Creat was 6 when he rec'd a deceased donor transplant 22-Oct-2010 at [facility]  According to patient he did OK then in the first few weeks after surgery had to go back in hospital for "BP" problems that "messed the kidney up". Since then creatinine has been in 2.3-3.0 range.  Sees only [place] nephrologists, gets most care there.  Takes prograf and Myfortic, no steroids.  PKD kidneys were left in place.    . Chronic kidney disease (CKD), stage IV (severe) ([place]) 12/08/2012    Of renal transplant, done 22-Oct-2010 at [facility]   . Cardiac arrest  ([place]) 12/05/2012    In the setting of anterior STEMI  . 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 Cough 08/13/2015  . URI (upper respiratory infection) 02/23/2015  . Atrial fibrillation ([place]) 11/04/2013  . Pulmonary nodules 10/28/2013  . Depression 10/20/2013  . ST elevation myocardial infarction (STEMI) of anterior wall ([place]) 12/12/2012  . Bradycardia 12/12/2012  . Normocytic anemia 12/12/2012  . Hypokalemia 12/12/2012  . Acute respiratory failure ([place]) 12/12/2012  . Cardiogenic shock ([place]) 12/12/2012  . Cardiac arrest ([place]) 12/12/2012  . GERD (gastroesophageal reflux disease) 12/12/2012  . Acute on chronic kidney disease, stage 4 12/12/2012  . S/p cadaver renal transplant 12/08/2012  . Chronic kidney disease (CKD), stage IV (severe) ([place]) 12/08/2012  . Herpes zoster 05/30/2012  . 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 Hypothyroidism 03/29/2007  . Diabetes mellitus ([place]) 03/29/2007  . HYPERLIPIDEMIA, MIXED 03/29/2007  . Essential hypertension 03/29/2007  . CAD (coronary artery disease) 03/29/2007  . STROKE 03/29/2007  . HEMORRHOIDS 03/29/2007  . BENIGN PROSTATIC HYPERTROPHY 03/29/2007  . Polycystic kidney 03/29/2007    Past Surgical History  Procedure Laterality Date  . Kidney transplant      Oct 22, 2010  . Other surgical history  11/21/12    cancerous cells removed from mid upper chest  . Eye surgery    .  Other surgical history N/A 11/15/2012    pt had skin cancer removed from mid upper chest  . Coronary angioplasty with stent placement  12/05/2012    30% oLAD, 20% mLAD, 95-99% pLAD s/p DES, 60% oD1, 70-80% pRamus, 40% pLCx, 50% AV groove Cx, 50% PLB, 100% mRCA s/p recanalization intra-procedurally, noted to be small non-dominant with 70% pRCA stenosis post-procedure  . Cardiac catheterization  2004    Nonobstructive  . Facial reconstruction surgery  2015    [facility]  . Left heart  catheterization with coronary angiogram N/A 12/05/2012    Procedure: LEFT HEART CATHETERIZATION WITH CORONARY ANGIOGRAM;  Surgeon: Burnell Blanks, MD;  Location: [facility] CATH LAB;  Service: Cardiovascular;  Laterality: N/A;  . Enucleation  2016    R eye    Current Outpatient Rx  Name  Route  Sig  Dispense  Refill  . amiodarone (PACERONE) 200 MG tablet   Oral   Take 1 tablet (200 mg total) by mouth daily. Patient taking differently: Take 400 mg by mouth 2 (two) times daily.          Marland Kitchen amiodarone (PACERONE) 200 MG tablet   Oral   Take 1 tablet (200 mg total) by mouth daily.   30 tablet   2   . aspirin EC 81 MG tablet      TAKE 1 TABLET BY MOUTH DAILY.         Marland Kitchen atorvastatin (LIPITOR) 20 MG tablet   Oral   Take 1 tablet (20 mg total) by mouth daily.   90 tablet   3   . atorvastatin (LIPITOR) 20 MG tablet      TAKE 1 TABLET (20 MG TOTAL) BY MOUTH DAILY.   90 tablet   2   . Cholecalciferol (VITAMIN D-1000 MAX ST) 1000 UNITS tablet   Oral   Take 1,000 Units by mouth.          . clopidogrel (PLAVIX) 75 MG tablet      TAKE 1 TABLET (75 MG TOTAL) BY MOUTH DAILY.   90 tablet   3   . diltiazem (CARDIZEM CD) 120 MG 24 hr capsule   Oral   Take 1 capsule (120 mg total) by mouth daily.         Marland Kitchen diltiazem (CARDIZEM CD) 120 MG 24 hr capsule      TAKE 1 CAPSULE (120 MG TOTAL) BY MOUTH DAILY.   30 capsule   11   . doxycycline (VIBRA-TABS) 100 MG tablet   Oral   Take 100 mg by mouth 2 (two) times daily.         Marland Kitchen esomeprazole (NEXIUM) 40 MG capsule   Oral   Take 40 mg by mouth daily.         . Ferrous Sulfate 134 MG TABS   Oral   Take 1 tablet by mouth daily.         . fluconazole (DIFLUCAN) 200 MG tablet   Oral   Take 100 mg by mouth daily.          . furosemide (LASIX) 20 MG tablet   Oral   Take 20 mg by mouth.         Marland Kitchen glimepiride (AMARYL) 1 MG tablet   Oral   Take 1 tablet (1 mg total) by mouth daily with breakfast.   90 tablet    1   . hydrALAZINE (APRESOLINE) 25 MG tablet   Oral   Take 75 mg by mouth 3 (  three) times daily.         Marland Kitchen levothyroxine (SYNTHROID) 150 MCG tablet   Oral   Take 1 tablet (150 mcg total) by mouth daily before breakfast.   90 tablet   3   . losartan (COZAAR) 25 MG tablet   Oral   Take 25 mg by mouth daily.         . magnesium oxide (MAG-OX) 400 MG tablet   Oral   Take 400 mg by mouth 3 (three) times daily.          . metoprolol (LOPRESSOR) 100 MG tablet      TAKE 1 TABLET (100 MG TOTAL) BY MOUTH 2 (TWO) TIMES DAILY.   60 tablet   6   . nitroGLYCERIN (NITROSTAT) 0.4 MG SL tablet   Sublingual   Place 1 tablet (0.4 mg total) under the tongue every 5 (five) minutes x 3 doses as needed for chest pain.   25 tablet   5   . pantoprazole (PROTONIX) 40 MG tablet      TAKE 1 TABLET (40 MG TOTAL) BY MOUTH DAILY.   30 tablet   11   . predniSONE (DELTASONE) 10 MG tablet   Oral   Take 1 tablet (10 mg total) by mouth daily with breakfast.         . ranitidine (ZANTAC) 150 MG capsule   Oral   Take 150 mg by mouth daily.         . sertraline (ZOLOFT) 100 MG tablet   Oral   Take 100 mg by mouth daily.          . sodium bicarbonate 650 MG tablet   Oral   Take 650 mg by mouth 3 (three) times daily.          . vitamin B-12 (CYANOCOBALAMIN) 1000 MCG tablet   Oral   Take 1,000 mcg by mouth.           Allergies Review of patient's allergies indicates no known allergies.  Family History  Problem Relation Age of Onset  . Kidney failure Mother     was on dialysis 3 times a week when she died  . Alcohol abuse Mother   . Hypertension Father   . Heart attack Sister   . Kidney disease Sister   . Aneurysm Sister   . COPD Sister   . Colon cancer Neg Hx   . Prostate cancer Neg Hx     Social History Social History  Substance Use Topics  . Smoking status: Never Smoker   . Smokeless tobacco: Never Used  . Alcohol Use: No    Review of  Systems Constitutional: No fever/chills Eyes: No visual changes. ENT: No sore throat. Cardiovascular: Denies chest pain. Respiratory: As above Gastrointestinal: No abdominal pain.  No nausea, no vomiting.  No diarrhea.  No constipation. Genitourinary: Negative for dysuria. Musculoskeletal: Negative for back pain. Skin: Negative for rash. Neurological: Negative for headaches, focal weakness or numbness.  10-point ROS otherwise negative.  ____________________________________________   PHYSICAL EXAM:  VITAL SIGNS: ED Triage Vitals  Enc Vitals Group     BP 03/19/16 1630 140/94 mmHg     Pulse Rate 03/19/16 1630 58     Resp 03/19/16 1630 18     Temp 03/19/16 1630 97.9 F (36.6 C)     Temp Source 03/19/16 1630 Oral     SpO2 03/19/16 1630 98 %     Weight 03/19/16 1630 160 lb (72.576 kg)  Height 03/19/16 1630 '5\' 9"'$  (1.753 m)     Head Cir --      Peak Flow --      Pain Score 03/19/16 1631 0     Pain Loc --      Pain Edu? --      Excl. in [place]? --     Constitutional: Alert and oriented. Well appearing and in no acute distress. Eyes: Conjunctivae are normal.  Head: Atraumatic. Nose: No congestion/rhinnorhea. Mouth/Throat: Mucous membranes are moist.  Oropharynx non-erythematous. Neck: No stridor.   Cardiovascular: Normal rate, regular rhythm. Grossly normal heart sounds.  Good peripheral circulation. Respiratory: Normal respiratory effort.  No retractions. Lungs CTAB. Gastrointestinal: Soft and nontender. No distention.  No CVA tenderness. Musculoskeletal: No lower extremity tenderness nor edema.  No joint effusions. Neurologic:  Normal speech and language. No gross focal neurologic deficits are appreciated. No ataxia on finger to nose testing. Skin:  Skin is warm, dry and intact. No rash noted. Psychiatric: Mood and affect are normal. Speech and behavior are normal.  ____________________________________________   LABS (all labs ordered are listed, but only abnormal  results are displayed)  Labs Reviewed  BASIC METABOLIC PANEL - Abnormal; Notable for the following:    Glucose, Bld 141 (*)    BUN 28 (*)    Creatinine, Ser 2.71 (*)    GFR calc non Af Amer 22 (*)    GFR calc Af Amer 26 (*)    All other components within normal limits  CBC - Abnormal; Notable for the following:    RDW 16.2 (*)    All other components within normal limits  URINALYSIS COMPLETEWITH MICROSCOPIC ([facility] ONLY) - Abnormal; Notable for the following:    Color, Urine YELLOW (*)    APPearance HAZY (*)    Protein, ur 30 (*)    Bacteria, UA RARE (*)    Squamous Epithelial / LPF 0-5 (*)    All other components within normal limits  GLUCOSE, CAPILLARY - Abnormal; Notable for the following:    Glucose-Capillary 141 (*)    All other components within normal limits  TSH - Abnormal; Notable for the following:    TSH 44.609 (*)    All other components within normal limits  TROPONIN I  CBG MONITORING, ED   ____________________________________________  EKG  ED ECG REPORT I, Doran Stabler, the attending physician, personally viewed and interpreted this ECG.   Date: 03/19/2016  EKG Time: 1656  Rate: 59  Rhythm: sinus bradycardia  Axis: Normal  Intervals:none  ST&T Change: No ST segment elevation or depression. No abnormal T-wave inversion.  ____________________________________________  RADIOLOGY   CT Head Wo Contrast (Final result) Result time: 03/19/16 18:56:54   Final result by Rad Results In Interface (03/19/16 18:56:54)   Narrative:   CLINICAL DATA: Increasing weakness for 2 weeks with intermittent dizziness and shortness of breath.  EXAM: CT HEAD WITHOUT CONTRAST  TECHNIQUE: Contiguous axial images were obtained from the base of the skull through the vertex without intravenous contrast.  COMPARISON: November 12, 2013  FINDINGS: There is cerumen in the right external auditory canal. The mastoid air cells and middle ears are well aerated. The  paranasal sinuses are unremarkable. No acute bony abnormalities.  Metallic artifact in the region the right orbit is consistent with an artificial eye. Recommend clinical correlation. This results in streak artifact.  Extracranial soft tissues are normal. No subdural, epidural, or subarachnoid hemorrhage. No mass, mass effect, or midline shift. The ventricles and sulci  are unchanged. There is an infarct in the left cerebellar hemisphere which is stable. Subtle low-attenuation just to the right of midline in the cerebellar vermis was probably present previously in the increased conspicuity is probably due to difference in slice selection. The cerebellum is otherwise stable. Probable lacunar infarct in the left cerebral peduncle, possibly present previously. There is a lacunar infarct in the left thalamus which was not seen previously but is favored to be nonacute. Lacunar infarcts in the basal ganglia are again identified bilaterally. There is a right frontal infarct again identified. Severe white matter changes are again noted. No acute cortical ischemia or infarct is identified. There is a small left frontal infarct which is stable.  IMPRESSION: 1. Bilateral frontal infarcts, unchanged. Left cerebellar infarct, unchanged. Multiple lacunar infarcts identified as described above. None are definitively acute but not all were definitively seen in 2015. An MRI could better assess for early or subtle infarct.   Electronically Signed By: Dorise Bullion III M.D On: 03/19/2016 18:56          DG Chest 2 View (Final result) Result time: 03/19/16 18:39:30   Final result by Rad Results In Interface (03/19/16 18:39:30)   Narrative:   CLINICAL DATA: Shortness of breath, weakness, and loss of appetite for 1 week.  EXAM: CHEST 2 VIEW  COMPARISON: Radiographs 08/12/2015  FINDINGS: The cardiomediastinal contours are normal. Streaky right lower lobe opacity may be  atelectasis or pneumonia, best appreciated on the lateral view. Pulmonary vasculature is normal. No pleural effusion or pneumothorax. No acute osseous abnormalities are seen.  IMPRESSION: Streaky right lower lobe opacity may reflect atelectasis or pneumonia.   Electronically Signed By: Jeb Levering M.D. On: 03/19/2016 18:39        ECG Results     ____________________________________________   PROCEDURES   Procedures   ____________________________________________   INITIAL IMPRESSION / ASSESSMENT AND PLAN / ED COURSE  Pertinent labs & imaging results that were available during my care of the patient were reviewed by me and considered in my medical decision making (see chart for details).  ----------------------------------------- 7:33 PM on 03/19/2016 -----------------------------------------  Patient with worsening TSH. Symptoms suggestive of thyroid disease. Patient failing outpatient treatment as well as being a transplant patient. Discussed the case at 6:55 PM with Dr. Lorin Mercy who is covering for the renal transplant service at [facility]. He accepted the patient for transfer onto the service of Dr. Kern Alberta.  Also with questionable right lower lobe infiltrate and the patient without symptoms of pneumonia. Discussed medication with Dr. Lorin Mercy and we'll hold on antibiotics or thyroid treatment at this time. The patient was able to ambulate to the room in the emergency department. However, because he is failing outpatient treatment and is a complicated patient is a kidney transplant with chronic kidney disease I feel that transfer is merited at this time. I explained the plan to the patient and his understanding and willing to comply. Will defer further workup including for possible MRI to [facility]. Patient with CT without any definitive acute infarcts but not all lacunar infarcts seen on previous CT from  2015. ____________________________________________   FINAL CLINICAL IMPRESSION(S) / ED DIAGNOSES  Final diagnoses:  Hypothyroidism, unspecified hypothyroidism type  Weakness      NEW MEDICATIONS STARTED DURING THIS VISIT:  New Prescriptions   No medications on file     Note:  This document was prepared using Dragon voice recognition software and may include unintentional dictation errors.    Orbie Pyo,  MD 03/19/16 1934  Angiocath insertion Performed by: Doran Stabler  Consent: Verbal consent obtained. Risks and benefits: risks, benefits and alternatives were discussed Time out: Immediately prior to procedure a "time out" was called to verify the correct patient, procedure, equipment, support staff and site/side marked as required.  Preparation: Patient was prepped and draped in the usual sterile fashion.  Vein Location: Left basilic Ultrasound Guided  Gauge: 18  Normal blood return and flush without difficulty Patient tolerance: Patient tolerated the procedure well with no immediate complications.     Orbie Pyo, MD 03/19/16 2202

## 2016-03-20 DIAGNOSIS — E039 Hypothyroidism, unspecified: Secondary | ICD-10-CM | POA: Diagnosis not present

## 2016-03-20 DIAGNOSIS — Q612 Polycystic kidney, adult type: Secondary | ICD-10-CM | POA: Diagnosis not present

## 2016-03-20 DIAGNOSIS — Z9181 History of falling: Secondary | ICD-10-CM | POA: Diagnosis not present

## 2016-03-20 DIAGNOSIS — E876 Hypokalemia: Secondary | ICD-10-CM | POA: Diagnosis not present

## 2016-03-20 DIAGNOSIS — I2510 Atherosclerotic heart disease of native coronary artery without angina pectoris: Secondary | ICD-10-CM | POA: Diagnosis not present

## 2016-03-20 DIAGNOSIS — I252 Old myocardial infarction: Secondary | ICD-10-CM | POA: Diagnosis not present

## 2016-03-20 DIAGNOSIS — R2681 Unsteadiness on feet: Secondary | ICD-10-CM | POA: Diagnosis not present

## 2016-03-20 DIAGNOSIS — Z7902 Long term (current) use of antithrombotics/antiplatelets: Secondary | ICD-10-CM | POA: Diagnosis not present

## 2016-03-20 DIAGNOSIS — R918 Other nonspecific abnormal finding of lung field: Secondary | ICD-10-CM | POA: Diagnosis not present

## 2016-03-20 DIAGNOSIS — I10 Essential (primary) hypertension: Secondary | ICD-10-CM | POA: Diagnosis not present

## 2016-03-20 DIAGNOSIS — Z79899 Other long term (current) drug therapy: Secondary | ICD-10-CM | POA: Diagnosis not present

## 2016-03-20 DIAGNOSIS — E2749 Other adrenocortical insufficiency: Secondary | ICD-10-CM | POA: Diagnosis not present

## 2016-03-20 DIAGNOSIS — R0600 Dyspnea, unspecified: Secondary | ICD-10-CM | POA: Diagnosis not present

## 2016-03-20 DIAGNOSIS — R531 Weakness: Secondary | ICD-10-CM | POA: Diagnosis not present

## 2016-03-20 DIAGNOSIS — Z7982 Long term (current) use of aspirin: Secondary | ICD-10-CM | POA: Diagnosis not present

## 2016-03-20 DIAGNOSIS — R001 Bradycardia, unspecified: Secondary | ICD-10-CM | POA: Diagnosis not present

## 2016-03-20 DIAGNOSIS — R911 Solitary pulmonary nodule: Secondary | ICD-10-CM | POA: Diagnosis not present

## 2016-03-20 DIAGNOSIS — Z940 Kidney transplant status: Secondary | ICD-10-CM | POA: Diagnosis not present

## 2016-03-21 ENCOUNTER — Telehealth: Payer: Self-pay

## 2016-03-21 DIAGNOSIS — R911 Solitary pulmonary nodule: Secondary | ICD-10-CM | POA: Diagnosis not present

## 2016-03-21 DIAGNOSIS — E2740 Unspecified adrenocortical insufficiency: Secondary | ICD-10-CM | POA: Diagnosis not present

## 2016-03-21 DIAGNOSIS — E2749 Other adrenocortical insufficiency: Secondary | ICD-10-CM | POA: Diagnosis not present

## 2016-03-21 DIAGNOSIS — R0600 Dyspnea, unspecified: Secondary | ICD-10-CM | POA: Diagnosis not present

## 2016-03-21 DIAGNOSIS — N2889 Other specified disorders of kidney and ureter: Secondary | ICD-10-CM | POA: Diagnosis not present

## 2016-03-21 DIAGNOSIS — R001 Bradycardia, unspecified: Secondary | ICD-10-CM | POA: Diagnosis not present

## 2016-03-21 DIAGNOSIS — E039 Hypothyroidism, unspecified: Secondary | ICD-10-CM | POA: Diagnosis not present

## 2016-03-21 DIAGNOSIS — K7689 Other specified diseases of liver: Secondary | ICD-10-CM | POA: Diagnosis not present

## 2016-03-21 DIAGNOSIS — I2510 Atherosclerotic heart disease of native coronary artery without angina pectoris: Secondary | ICD-10-CM | POA: Diagnosis not present

## 2016-03-21 DIAGNOSIS — Z940 Kidney transplant status: Secondary | ICD-10-CM | POA: Diagnosis not present

## 2016-03-21 DIAGNOSIS — R918 Other nonspecific abnormal finding of lung field: Secondary | ICD-10-CM | POA: Diagnosis not present

## 2016-03-21 DIAGNOSIS — R531 Weakness: Secondary | ICD-10-CM | POA: Diagnosis not present

## 2016-03-21 NOTE — Telephone Encounter (Signed)
PLEASE NOTE: All timestamps contained within this report are represented as [place] Standard Time. CONFIDENTIALTY NOTICE: This fax transmission is intended only for the addressee. It contains information that is legally privileged, confidential or otherwise protected from use or disclosure. If you are not the intended recipient, you are strictly prohibited from reviewing, disclosing, copying using or disseminating any of this information or taking any action in reliance on or regarding this information. If you have received this fax in error, please notify us immediately by telephone so that we can arrange for its return to [place]. Phone: [phone], Toll-Free: [phone], Fax: [phone] Page: 1 of 2 Call Id: [id number] [place] Patient Name: Edward Oconnell Gender: Male DOB: 1946/01/30 Age: 70 Y 12 D Return Phone Number: [phone] (Primary) Address: City/State/Zip: [place] [address] Client [facility] Night - Client Client Site [place] Physician Renford Dills - MD Contact Type Call Who Is Calling Patient / Member / Family / Caregiver Call Type Triage / Clinical Caller Name Jovonte Commins Relationship To Patient Spouse Return Phone Number [phone] (Primary) Chief Complaint Weakness, Generalized Reason for Call Symptomatic / Request for [place] says husband is a kidney patient, he is not eating, he is very weak, unable to walk from room to room. His rate is 60, sugar level is 107. PreDisposition Call Doctor Translation No Nurse Assessment Nurse: Richardson Landry, RN, Aldona Bar Date/Time (Eastern Time): 03/19/2016 2:39:36 PM Confirm and document reason for call. If symptomatic, describe symptoms. You must click the next button to save text entered. ---Caller states husband is so weak only eating about a  bite or two, has been going on for months but doctors will not listen to her. Caller states husband can stand but only able to go short distance then tires out. Current heart rate 60 and blood sugar 107. Caller states husband is a kidney transplant pt. Has the patient traveled out of the country within the last 30 days? ---Not Applicable Does the patient have any new or worsening symptoms? ---Yes Will a triage be completed? ---Yes Related visit to physician within the last 2 weeks? ---No Does the PT have any chronic conditions? (i.e. diabetes, asthma, etc.) ---Yes List chronic conditions. ---kidney transplant, heart attack, tia, Is this a behavioral health or substance abuse call? ---No Guidelines Guideline Title Affirmed Question Affirmed Notes Nurse Date/Time (Eastern Time) Weakness (Generalized) and Fatigue Patient sounds very sick or weak to the triager Richardson Landry, RN, Aldona Bar 03/19/2016 2:42:25 PM Disp. Time Eilene Ghazi Time) Disposition Final User 03/19/2016 2:48:07 PM Go to ED Now (or PCP triage) Yes Richardson Landry, RN, Aldona Bar PLEASE NOTE: All timestamps contained within this report are represented as [place] Standard Time. CONFIDENTIALTY NOTICE: This fax transmission is intended only for the addressee. It contains information that is legally privileged, confidential or otherwise protected from use or disclosure. If you are not the intended recipient, you are strictly prohibited from reviewing, disclosing, copying using or disseminating any of this information or taking any action in reliance on or regarding this information. If you have received this fax in error, please notify us immediately by telephone so that we can arrange for its return to [place]. Phone: [phone], Toll-Free: [phone], Fax: [phone] Page: 2 of 2 Call Id: [id number] Caller Understands: Yes Disagree/Comply: Comply Care Advice Given Per Guideline GO TO ED NOW (OR PCP TRIAGE): DRIVING: Another adult should drive.  CARE ADVICE  given per Weakness and Fatigue (Adult) guideline. Referrals [facility] - ED

## 2016-03-21 NOTE — Telephone Encounter (Signed)
Per chart review tab pt was seen [facility] ED on 03/19/16 and transferred to [facility].

## 2016-03-21 NOTE — Telephone Encounter (Signed)
Noted. Thanks.  I'll await the [facility] notes.

## 2016-03-22 DIAGNOSIS — E2749 Other adrenocortical insufficiency: Secondary | ICD-10-CM | POA: Diagnosis not present

## 2016-03-22 DIAGNOSIS — Z7902 Long term (current) use of antithrombotics/antiplatelets: Secondary | ICD-10-CM | POA: Diagnosis not present

## 2016-03-22 DIAGNOSIS — E876 Hypokalemia: Secondary | ICD-10-CM | POA: Diagnosis not present

## 2016-03-22 DIAGNOSIS — I2510 Atherosclerotic heart disease of native coronary artery without angina pectoris: Secondary | ICD-10-CM | POA: Diagnosis not present

## 2016-03-22 DIAGNOSIS — R5383 Other fatigue: Secondary | ICD-10-CM | POA: Diagnosis not present

## 2016-03-22 DIAGNOSIS — E039 Hypothyroidism, unspecified: Secondary | ICD-10-CM | POA: Diagnosis present

## 2016-03-22 DIAGNOSIS — Z9181 History of falling: Secondary | ICD-10-CM | POA: Diagnosis not present

## 2016-03-22 DIAGNOSIS — R2681 Unsteadiness on feet: Secondary | ICD-10-CM | POA: Diagnosis not present

## 2016-03-22 DIAGNOSIS — Z940 Kidney transplant status: Secondary | ICD-10-CM | POA: Diagnosis not present

## 2016-03-22 DIAGNOSIS — Z79899 Other long term (current) drug therapy: Secondary | ICD-10-CM | POA: Diagnosis not present

## 2016-03-22 DIAGNOSIS — I252 Old myocardial infarction: Secondary | ICD-10-CM | POA: Diagnosis not present

## 2016-03-22 DIAGNOSIS — R001 Bradycardia, unspecified: Secondary | ICD-10-CM | POA: Diagnosis not present

## 2016-03-22 DIAGNOSIS — I10 Essential (primary) hypertension: Secondary | ICD-10-CM | POA: Diagnosis present

## 2016-03-22 DIAGNOSIS — Z7982 Long term (current) use of aspirin: Secondary | ICD-10-CM | POA: Diagnosis not present

## 2016-03-23 NOTE — Telephone Encounter (Signed)
Left message on voicemail for patient to call back. 

## 2016-03-23 NOTE — Telephone Encounter (Signed)
Call pt.  I got the notes from [facility].  Per the records, he felt better after restarting prednisone.  Per records, he was to stop amiodarone ,atorvastatin, diltiazem, and metoPROLOL, at least until he has follow up appointment.  He was to stop fluconazole in the meantime I can recheck his TSH later on, in about 4 weeks.   He had 2 pulmonary nodules that need f/u CT in about 3 months.  If [facility] doesn't take care of the follow up, then I can order that.  Have him consider where he wants the f/u CT done (locally or there at [facility]) Thanks.

## 2016-03-24 NOTE — Telephone Encounter (Signed)
Wife advised, lab appt scheduled.  Wife says there has been no FU CT appt scheduled.  This was apparently done previously at [facility] but she would love to have it done closer by if possible unless it is important that it be done at the same location.

## 2016-03-25 NOTE — Telephone Encounter (Signed)
Patient notified as instructed by telephone and verbalized understanding. 

## 2016-03-25 NOTE — Telephone Encounter (Signed)
I'll put a reminder in the EMR here for 3 months about the CT.   If not already done by that point elsewhere, then we can order.  We'll contact him in about 3 months.   Thanks.

## 2016-04-04 DIAGNOSIS — C44629 Squamous cell carcinoma of skin of left upper limb, including shoulder: Secondary | ICD-10-CM | POA: Diagnosis not present

## 2016-04-04 DIAGNOSIS — L905 Scar conditions and fibrosis of skin: Secondary | ICD-10-CM | POA: Diagnosis not present

## 2016-04-06 DIAGNOSIS — D899 Disorder involving the immune mechanism, unspecified: Secondary | ICD-10-CM | POA: Diagnosis not present

## 2016-04-06 DIAGNOSIS — F17200 Nicotine dependence, unspecified, uncomplicated: Secondary | ICD-10-CM | POA: Diagnosis not present

## 2016-04-06 DIAGNOSIS — E039 Hypothyroidism, unspecified: Secondary | ICD-10-CM | POA: Diagnosis not present

## 2016-04-06 DIAGNOSIS — N184 Chronic kidney disease, stage 4 (severe): Secondary | ICD-10-CM | POA: Diagnosis not present

## 2016-04-06 DIAGNOSIS — I2510 Atherosclerotic heart disease of native coronary artery without angina pectoris: Secondary | ICD-10-CM | POA: Diagnosis not present

## 2016-04-06 DIAGNOSIS — E872 Acidosis: Secondary | ICD-10-CM | POA: Diagnosis not present

## 2016-04-06 DIAGNOSIS — Z79899 Other long term (current) drug therapy: Secondary | ICD-10-CM | POA: Diagnosis not present

## 2016-04-06 DIAGNOSIS — Z8673 Personal history of transient ischemic attack (TIA), and cerebral infarction without residual deficits: Secondary | ICD-10-CM | POA: Diagnosis not present

## 2016-04-06 DIAGNOSIS — E7800 Pure hypercholesterolemia, unspecified: Secondary | ICD-10-CM | POA: Diagnosis not present

## 2016-04-06 DIAGNOSIS — D631 Anemia in chronic kidney disease: Secondary | ICD-10-CM | POA: Diagnosis not present

## 2016-04-06 DIAGNOSIS — Z940 Kidney transplant status: Secondary | ICD-10-CM | POA: Diagnosis not present

## 2016-04-06 DIAGNOSIS — Z4822 Encounter for aftercare following kidney transplant: Secondary | ICD-10-CM | POA: Diagnosis not present

## 2016-04-06 DIAGNOSIS — I252 Old myocardial infarction: Secondary | ICD-10-CM | POA: Diagnosis not present

## 2016-04-06 DIAGNOSIS — Z9889 Other specified postprocedural states: Secondary | ICD-10-CM | POA: Diagnosis not present

## 2016-04-06 DIAGNOSIS — C760 Malignant neoplasm of head, face and neck: Secondary | ICD-10-CM | POA: Diagnosis not present

## 2016-04-06 DIAGNOSIS — Z7982 Long term (current) use of aspirin: Secondary | ICD-10-CM | POA: Diagnosis not present

## 2016-04-06 DIAGNOSIS — E785 Hyperlipidemia, unspecified: Secondary | ICD-10-CM | POA: Diagnosis not present

## 2016-04-06 DIAGNOSIS — I129 Hypertensive chronic kidney disease with stage 1 through stage 4 chronic kidney disease, or unspecified chronic kidney disease: Secondary | ICD-10-CM | POA: Diagnosis not present

## 2016-04-12 DIAGNOSIS — Q111 Other anophthalmos: Secondary | ICD-10-CM | POA: Diagnosis not present

## 2016-04-18 DIAGNOSIS — C44329 Squamous cell carcinoma of skin of other parts of face: Secondary | ICD-10-CM | POA: Diagnosis not present

## 2016-04-18 DIAGNOSIS — D0439 Carcinoma in situ of skin of other parts of face: Secondary | ICD-10-CM | POA: Diagnosis not present

## 2016-04-18 DIAGNOSIS — L905 Scar conditions and fibrosis of skin: Secondary | ICD-10-CM | POA: Diagnosis not present

## 2016-04-19 ENCOUNTER — Encounter: Payer: Self-pay | Admitting: Family Medicine

## 2016-04-19 LAB — WBC
ALT: 193
AST: 113 U/L
Creatinine, Ser: 2.26
Glucose: 169
HEMOGLOBIN: 13.7
PLATELETS: 297
TSH: 5.639
WBC: 8.9

## 2016-04-21 ENCOUNTER — Other Ambulatory Visit: Payer: Self-pay | Admitting: Primary Care

## 2016-04-21 DIAGNOSIS — K769 Liver disease, unspecified: Secondary | ICD-10-CM | POA: Diagnosis not present

## 2016-04-21 DIAGNOSIS — Q612 Polycystic kidney, adult type: Secondary | ICD-10-CM | POA: Diagnosis not present

## 2016-04-21 DIAGNOSIS — Z955 Presence of coronary angioplasty implant and graft: Secondary | ICD-10-CM | POA: Diagnosis not present

## 2016-04-21 DIAGNOSIS — N184 Chronic kidney disease, stage 4 (severe): Secondary | ICD-10-CM | POA: Diagnosis not present

## 2016-04-21 DIAGNOSIS — Z79899 Other long term (current) drug therapy: Secondary | ICD-10-CM | POA: Diagnosis not present

## 2016-04-21 DIAGNOSIS — E2740 Unspecified adrenocortical insufficiency: Secondary | ICD-10-CM | POA: Diagnosis not present

## 2016-04-21 DIAGNOSIS — E039 Hypothyroidism, unspecified: Secondary | ICD-10-CM

## 2016-04-21 DIAGNOSIS — I129 Hypertensive chronic kidney disease with stage 1 through stage 4 chronic kidney disease, or unspecified chronic kidney disease: Secondary | ICD-10-CM | POA: Diagnosis not present

## 2016-04-21 DIAGNOSIS — Z7982 Long term (current) use of aspirin: Secondary | ICD-10-CM | POA: Diagnosis not present

## 2016-04-21 DIAGNOSIS — T8612 Kidney transplant failure: Secondary | ICD-10-CM | POA: Diagnosis not present

## 2016-04-21 DIAGNOSIS — I2510 Atherosclerotic heart disease of native coronary artery without angina pectoris: Secondary | ICD-10-CM | POA: Diagnosis not present

## 2016-04-21 DIAGNOSIS — R740 Nonspecific elevation of levels of transaminase and lactic acid dehydrogenase [LDH]: Secondary | ICD-10-CM | POA: Diagnosis not present

## 2016-04-25 ENCOUNTER — Other Ambulatory Visit: Payer: Self-pay | Admitting: Family Medicine

## 2016-04-25 DIAGNOSIS — D0461 Carcinoma in situ of skin of right upper limb, including shoulder: Secondary | ICD-10-CM | POA: Diagnosis not present

## 2016-04-26 ENCOUNTER — Other Ambulatory Visit (INDEPENDENT_AMBULATORY_CARE_PROVIDER_SITE_OTHER): Payer: Medicare Other

## 2016-04-26 ENCOUNTER — Other Ambulatory Visit: Payer: Self-pay | Admitting: Family Medicine

## 2016-04-26 DIAGNOSIS — E039 Hypothyroidism, unspecified: Secondary | ICD-10-CM

## 2016-04-26 LAB — TSH: TSH: 0.36 u[IU]/mL (ref 0.35–4.50)

## 2016-05-02 DIAGNOSIS — H2512 Age-related nuclear cataract, left eye: Secondary | ICD-10-CM | POA: Diagnosis not present

## 2016-05-10 ENCOUNTER — Other Ambulatory Visit: Payer: Medicare Other

## 2016-05-12 ENCOUNTER — Encounter: Payer: Self-pay | Admitting: Cardiovascular Disease

## 2016-05-12 ENCOUNTER — Other Ambulatory Visit: Payer: Self-pay

## 2016-05-17 ENCOUNTER — Ambulatory Visit: Payer: Medicare Other | Admitting: Family Medicine

## 2016-05-17 DIAGNOSIS — Z0289 Encounter for other administrative examinations: Secondary | ICD-10-CM

## 2016-05-18 ENCOUNTER — Other Ambulatory Visit: Payer: Self-pay | Admitting: Cardiovascular Disease

## 2016-05-19 ENCOUNTER — Other Ambulatory Visit: Payer: Self-pay | Admitting: Family Medicine

## 2016-05-19 DIAGNOSIS — E039 Hypothyroidism, unspecified: Secondary | ICD-10-CM

## 2016-05-30 ENCOUNTER — Ambulatory Visit (INDEPENDENT_AMBULATORY_CARE_PROVIDER_SITE_OTHER): Payer: Medicare Other | Admitting: Cardiovascular Disease

## 2016-05-30 ENCOUNTER — Encounter: Payer: Self-pay | Admitting: Cardiovascular Disease

## 2016-05-30 ENCOUNTER — Encounter (INDEPENDENT_AMBULATORY_CARE_PROVIDER_SITE_OTHER): Payer: Self-pay

## 2016-05-30 VITALS — BP 110/66 | HR 64 | Ht 69.0 in | Wt 150.0 lb

## 2016-05-30 DIAGNOSIS — N183 Chronic kidney disease, stage 3 (moderate): Secondary | ICD-10-CM

## 2016-05-30 DIAGNOSIS — I480 Paroxysmal atrial fibrillation: Secondary | ICD-10-CM

## 2016-05-30 DIAGNOSIS — E785 Hyperlipidemia, unspecified: Secondary | ICD-10-CM | POA: Diagnosis not present

## 2016-05-30 DIAGNOSIS — I10 Essential (primary) hypertension: Secondary | ICD-10-CM

## 2016-05-30 DIAGNOSIS — I2510 Atherosclerotic heart disease of native coronary artery without angina pectoris: Secondary | ICD-10-CM | POA: Diagnosis not present

## 2016-05-30 NOTE — Patient Instructions (Signed)

## 2016-05-30 NOTE — Progress Notes (Signed)
Chief Complaint  Patient presents with  . Bleeding/Bruising     History of Present Illness: 70 yo male with history of CAD, chronic stage III kidney disease s/p kidney transplant, CVA and atrial fibrillation who is here today for cardiac follow up. He was admitted to [facility] 12/05/12 with anterior STEMI complicated by ventricular fibrillation and cardiogenic shock. A drug eluting eluting stent (3.0 x 38 mm Promus Premier post-dilated with a 3.5 Benson balloon) was placed in the LAD. The small non-dominant RCA was occluded at beginning of case and reperfused with medical therapy during case. Moderate caliber intermediate branch with moderate stenosis. An IABP was placed and he required support with pressor agents for 48 hours post cath. He was placed on amiodarone but this was stopped before discharge secondary to bradycardia. Echo 12/06/12 with preserved LV systolic function. Cath also showed a moderately severe stenosis in a moderate caliber intermediate branch which we chose to manage medically. I arranged a stress myoview after his August 2014 appt but he cancelled. He was admitted to [facility] November 2014 with N/V, diarrhea, confusion, gait instability after radiation therapy to right cheek. Negative hypercoagulable workup. Vasculitis labs negative. 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 TEE without left atrial thrombus. 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 Event monitor 09/27/13  showed atrial fibrillation. He was started on coumadin.  ASA was held and Brilinta was replaced with Plavix. He came in for INR check in early March in our office and fell, hit his head and had a high INR. Coumadin was stopped and ASA restarted. The decision was made not to restart coumadin given the difficulty controlling his INR. He was admitted to [facility] March 2015 with a CNS fungal infection. Admitted to [facility]  June 2016 with atrial fib with RVR. He was on amiodarone, metoprolol and Cardizem but these have since been stopped in setting of bradycardia and weakness. Statin stopped due to elevated LFTs in July 2017.   He is here today for follow up. No chest pain or SOB. No awareness of irregularity of his heart rhythm. No LE edema. He has frequent adjustment of medications in multiple clinic settings. He is still working.   Primary Care Physician: Elsie Stain, MD Nephrology: Dr. Dayle Points at [facility] ENT: Dr. Colin Benton [facility]) Neurology: [facility]) Johna Sheriff   Past Medical History:  Diagnosis Date  . 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 Benign prostatic hypertrophy    a. Nonobstructive on 2004 cath b. anterior STEMI s/p DES-pLAD 12/05/12  . CAD (coronary artery disease)   . Cardiac arrest ([place]) 12/05/2012   In the setting of anterior STEMI  . Chronic kidney disease (CKD), stage IV (severe) ([place]) 12/08/2012   Of renal transplant, done 2010-10-07 at [facility]   . Diabetes mellitus without complication ([place])   . GERD (gastroesophageal reflux disease)   . History of shingles 08/2012   Lt eye shingles  . Hyperlipidemia   . Hypertension   . Hypothyroidism   . 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 Creat was 6 when he rec'd a deceased donor transplant 10/07/2010 at [facility].  According to patient he did OK then in the first few weeks after surgery had to go back in hospital for "BP" problems that "messed the kidney up". Since then creatinine  has been in 2.3-3.0 range.  Sees only [place] nephrologists, gets most care there.  Takes prograf and Myfortic, no steroids.  PKD kidneys were left in place.    . Skin cancer   . Stroke [facility]) 2009    Past Surgical History:  Procedure Laterality Date  . CARDIAC CATHETERIZATION  2004   Nonobstructive  . CORONARY ANGIOPLASTY WITH STENT PLACEMENT  12/05/2012   30% oLAD, 20% mLAD, 95-99%  pLAD s/p DES, 60% oD1, 70-80% pRamus, 40% pLCx, 50% AV groove Cx, 50% PLB, 100% mRCA s/p recanalization intra-procedurally, noted to be small non-dominant with 70% pRCA stenosis post-procedure  . ENUCLEATION  2016   R eye  . EYE SURGERY    . FACIAL RECONSTRUCTION SURGERY  2015   [facility]  . KIDNEY TRANSPLANT     09/2010  . LEFT HEART CATHETERIZATION WITH CORONARY ANGIOGRAM N/A 12/05/2012   Procedure: LEFT HEART CATHETERIZATION WITH CORONARY ANGIOGRAM;  Surgeon: Burnell Blanks, MD;  Location: [facility] CATH LAB;  Service: Cardiovascular;  Laterality: N/A;  . OTHER SURGICAL HISTORY  11/21/12   cancerous cells removed from mid upper chest  . OTHER SURGICAL HISTORY N/A 11/15/2012   pt had skin cancer removed from mid upper chest    Current Outpatient Prescriptions  Medication Sig Dispense Refill  . amLODipine (NORVASC) 5 MG tablet Take 5 mg by mouth daily.    Marland Kitchen aspirin EC 81 MG tablet TAKE 1 TABLET BY MOUTH DAILY.    Marland Kitchen Cholecalciferol (VITAMIN D-1000 MAX ST) 1000 UNITS tablet Take 1,000 Units by mouth.     . clopidogrel (PLAVIX) 75 MG tablet TAKE 1 TABLET (75 MG TOTAL) BY MOUTH DAILY. 90 tablet 3  . fluconazole (DIFLUCAN) 100 MG tablet Take 50 mg by mouth daily.     Marland Kitchen glimepiride (AMARYL) 1 MG tablet TAKE 1 TABLET (1 MG TOTAL) BY MOUTH DAILY WITH BREAKFAST. 90 tablet 1  . hydrALAZINE (APRESOLINE) 25 MG tablet Take 25 mg by mouth 3 (three) times daily.     Marland Kitchen levothyroxine (SYNTHROID, LEVOTHROID) 150 MCG tablet TAKE 1 TABLET (150 MCG TOTAL) BY MOUTH DAILY BEFORE BREAKFAST. 30 tablet 5  . magnesium oxide (MAG-OX) 400 MG tablet Take 400 mg by mouth 2 (two) times daily.     . pantoprazole (PROTONIX) 40 MG tablet TAKE 1 TABLET (40 MG TOTAL) BY MOUTH DAILY. 30 tablet 3  . sertraline (ZOLOFT) 100 MG tablet Take 100 mg by mouth daily.     . sodium bicarbonate 650 MG tablet Take 650 mg by mouth 2 (two) times daily.     . vitamin B-12 (CYANOCOBALAMIN) 1000 MCG tablet Take 1,000 mcg by mouth.    .  nitroGLYCERIN (NITROSTAT) 0.4 MG SL tablet Place 1 tablet (0.4 mg total) under the tongue every 5 (five) minutes x 3 doses as needed for chest pain. (Patient not taking: Reported on 05/30/2016) 25 tablet 5   No current facility-administered medications for this visit.     Allergies  Allergen Reactions  . Amiodarone Other (See Comments)    Abnormal LFTs    Social History   Social History  . Marital status: Married    Spouse name: N/A  . Number of children: N/A  . Years of education: N/A   Occupational History  . 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 Smoking status: Never Smoker  . Smokeless tobacco: Never Used  .  Alcohol use No  . Drug use: No  . Sexual activity: Yes   Other Topics Concern  . Not on file   Social History Narrative   Running lawn care service, part time.     Married 1966    Family History  Problem Relation Age of Onset  . Kidney failure Mother     was on dialysis 3 times a week when she died  . Alcohol abuse Mother   . Hypertension Father   . Heart attack Sister   . Kidney disease Sister   . Aneurysm Sister   . COPD Sister   . Colon cancer Neg Hx   . Prostate cancer Neg Hx     Review of Systems:  As stated in the HPI and otherwise negative.   BP 110/66   Pulse 64   Ht '5\' 9"'$  (1.753 m)   Wt 150 lb (68 kg)   BMI 22.15 kg/m   Physical Examination: General: Well developed, well nourished, NAD  HEENT: OP clear, mucus membranes moist  SKIN: warm, dry. No rashes. Neuro: No focal deficits  Musculoskeletal: Muscle strength 5/5 all ext  Psychiatric: Mood and affect normal  Neck: No JVD, no carotid bruits, no thyromegaly, no lymphadenopathy.  Lungs:Clear bilaterally, no wheezes, rhonci, crackles Cardiovascular: Regular rate and rhythm. No murmurs, gallops or rubs. Abdomen:Soft. Bowel sounds present. Non-tender.  Extremities: No lower extremity edema. Pulses  are 2 + in the bilateral DP/PT.  Cardiac cath 12/05/12:  Left main: 30% ostial stenosis. 20% mid stenosis.  Left Anterior Descending Artery: Large caliber vessel that courses to the apex. The proximal vessel is hazy with diffuse 95-99% stenosis extending from the ostium down to the first diagonal branch. The remainder of the LAD has no flow limiting disease. The diagonal branch is patent ostial 60% stenosis.  Ramus Intermediate: Moderate caliber vessel with 70-80% proximal stenosis.  Circumflex Artery: Large caliber, dominant vessel with 40% proximal stenosis. The first obtuse marginal branch is small in caliber with no obstructive disease. The second obtuse marginal branch is small with no disease. The distal AV groove Circumflex has a focal 50% stenosis. The left posterolateral branch has 50% stenosis.  Right Coronary Artery: Small caliber (1.75 mm) non-dominant vessel with 100% mid occlusion. (This vessel recanalized during the procedure and at the end of the procedure was noted to be small, non-dominant with a proximal 70% stenosis.   Echo 12/06/12:  Left ventricle: The cavity size was normal. Wall thickness was increased in a pattern of mild LVH. Systolic function was normal. The estimated ejection fraction was in the range of 60% to 65%. Wall motion was normal; there were no regional wall motion abnormalities. - Right ventricle: The cavity size was mildly dilated. Systolic function was mildly reduced.  EKG:  EKG is not ordered today. The ekg ordered today demonstrates   Recent Labs: 03/19/2016: BUN 28; Hemoglobin 15.1; Potassium 4.7; Sodium 136 04/12/2016: ALT 193; Creatinine, Ser 2.26; Platelets 297 04/26/2016: TSH 0.36   Lipid Panel    Component Value Date/Time   CHOL 154 02/18/2015 0135   TRIG 209 (H) 02/18/2015 0135   HDL 33 (L) 02/18/2015 0135   CHOLHDL 4.7 02/18/2015 0135   VLDL 42 (H) 02/18/2015 0135   LDLCALC 79 02/18/2015 0135   LDLDIRECT 103.0 01/21/2015 1432     Wt  Readings from Last 3 Encounters:  05/30/16 150 lb (68 kg)  03/19/16 160 lb (72.6 kg)  01/05/16 163 lb 12.8 oz (74.3 kg)  Other studies Reviewed: Additional studies/ records that were reviewed today include: . Review of the above records demonstrates:   Assessment and Plan:   1. CAD: No recent chest pain suggestive of angina. He is s/p anterior STEMI March 2336 complicated by incessant VF and cardiogenic shock s/p DES x 1 proximal LAD. Small non-dominant RCA was occluded at beginning of case and reperfused with medical therapy during case. Moderate disease in intermediate branch which we have managed medically. Echo 12/06/12 with preserved LV systolic function. He is now on ASA and Plavix. He is off of his statin due to elevated LFTs. He is off of Lopressor due to bradycardia.   2. Chronic kidney disease, stage III s/p kidney transplant: Stable. Followed in Nephrology.   3. HTN: BP controlled. No changes today. He is on hydralazine and Norvasc.   4. Paroxysmal, atrial fibrillation: Sinus today by exam with ectopy c/w premature beats. He is only on ASA and Plavix. Coumadin was tried but we could not control his INR. He understands risk of CVA with recurrent a. Fib but given multiple complex issues, we will not start anti-coagulation.   5. Hyperlipidemia: He is off of his statin due to elevated LFTs.    Current medicines are reviewed at length with the patient today.  The patient does not have concerns regarding medicines.  The following changes have been made:  no change  Labs/ tests ordered today include:  No orders of the defined types were placed in this encounter.   Disposition:   FU with me in 6 months   Signed, Lauree Chandler, MD 05/30/2016 9:19 AM    [facility] [place], [place], [place]  [address] Phone: [phone]; Fax: [phone]

## 2016-06-01 DIAGNOSIS — H169 Unspecified keratitis: Secondary | ICD-10-CM | POA: Diagnosis not present

## 2016-06-19 ENCOUNTER — Telehealth: Payer: Self-pay | Admitting: Family Medicine

## 2016-06-19 DIAGNOSIS — R918 Other nonspecific abnormal finding of lung field: Secondary | ICD-10-CM

## 2016-06-19 NOTE — Telephone Encounter (Signed)
Call pt.  He had 2 pulmonary nodules at [facility] that need f/u chest CT in 06/2016.  I put in the order. Thanks.

## 2016-06-20 DIAGNOSIS — N184 Chronic kidney disease, stage 4 (severe): Secondary | ICD-10-CM | POA: Diagnosis not present

## 2016-06-20 NOTE — Telephone Encounter (Signed)
Wife advised.  Please call wife's cell phone to schedule CT  [phone]

## 2016-06-21 ENCOUNTER — Other Ambulatory Visit (HOSPITAL_COMMUNITY): Payer: Self-pay | Admitting: Internal Medicine

## 2016-06-23 NOTE — Telephone Encounter (Signed)
CT scheduled 06/29/16.  Thanks.

## 2016-06-29 ENCOUNTER — Ambulatory Visit
Admission: RE | Admit: 2016-06-29 | Discharge: 2016-06-29 | Disposition: A | Discharge status: Home / Self Care | Payer: Medicare Other | Source: Ambulatory Visit | Attending: Family Medicine | Admitting: Family Medicine

## 2016-06-29 DIAGNOSIS — I700 Atherosclerosis of aorta: Secondary | ICD-10-CM | POA: Insufficient documentation

## 2016-06-29 DIAGNOSIS — R918 Other nonspecific abnormal finding of lung field: Secondary | ICD-10-CM | POA: Insufficient documentation

## 2016-06-29 DIAGNOSIS — Q613 Polycystic kidney, unspecified: Secondary | ICD-10-CM | POA: Diagnosis not present

## 2016-06-29 DIAGNOSIS — I2510 Atherosclerotic heart disease of native coronary artery without angina pectoris: Secondary | ICD-10-CM | POA: Diagnosis not present

## 2016-07-04 DIAGNOSIS — N189 Chronic kidney disease, unspecified: Secondary | ICD-10-CM | POA: Diagnosis not present

## 2016-07-04 LAB — WBC
ALT: 32
AST: 29 U/L
BUN: 41
CREATININE: 2.88
Glucose: 177
Hemoglobin: 13.8
Magnesium: 2.1
PHOSPHORUS: 3.1
Platelets: 213
WBC: 8.6

## 2016-07-06 DIAGNOSIS — I2510 Atherosclerotic heart disease of native coronary artery without angina pectoris: Secondary | ICD-10-CM | POA: Diagnosis not present

## 2016-07-06 DIAGNOSIS — I129 Hypertensive chronic kidney disease with stage 1 through stage 4 chronic kidney disease, or unspecified chronic kidney disease: Secondary | ICD-10-CM | POA: Diagnosis not present

## 2016-07-06 DIAGNOSIS — Z79899 Other long term (current) drug therapy: Secondary | ICD-10-CM | POA: Diagnosis not present

## 2016-07-06 DIAGNOSIS — E039 Hypothyroidism, unspecified: Secondary | ICD-10-CM | POA: Diagnosis not present

## 2016-07-06 DIAGNOSIS — Z8673 Personal history of transient ischemic attack (TIA), and cerebral infarction without residual deficits: Secondary | ICD-10-CM | POA: Diagnosis not present

## 2016-07-06 DIAGNOSIS — Z940 Kidney transplant status: Secondary | ICD-10-CM | POA: Diagnosis not present

## 2016-07-06 DIAGNOSIS — Z85828 Personal history of other malignant neoplasm of skin: Secondary | ICD-10-CM | POA: Diagnosis not present

## 2016-07-06 DIAGNOSIS — N184 Chronic kidney disease, stage 4 (severe): Secondary | ICD-10-CM | POA: Diagnosis not present

## 2016-07-06 DIAGNOSIS — Z7952 Long term (current) use of systemic steroids: Secondary | ICD-10-CM | POA: Diagnosis not present

## 2016-07-06 DIAGNOSIS — R918 Other nonspecific abnormal finding of lung field: Secondary | ICD-10-CM | POA: Diagnosis not present

## 2016-07-06 DIAGNOSIS — E785 Hyperlipidemia, unspecified: Secondary | ICD-10-CM | POA: Diagnosis not present

## 2016-07-06 DIAGNOSIS — Z7982 Long term (current) use of aspirin: Secondary | ICD-10-CM | POA: Diagnosis not present

## 2016-07-11 ENCOUNTER — Encounter: Payer: Self-pay | Admitting: Family Medicine

## 2016-07-18 ENCOUNTER — Ambulatory Visit (INDEPENDENT_AMBULATORY_CARE_PROVIDER_SITE_OTHER): Payer: Medicare Other

## 2016-07-18 VITALS — BP 92/70 | HR 75 | Temp 97.7°F | Ht 66.5 in | Wt 145.5 lb

## 2016-07-18 DIAGNOSIS — Z23 Encounter for immunization: Secondary | ICD-10-CM | POA: Diagnosis not present

## 2016-07-18 DIAGNOSIS — N184 Chronic kidney disease, stage 4 (severe): Secondary | ICD-10-CM

## 2016-07-18 DIAGNOSIS — E7849 Other hyperlipidemia: Secondary | ICD-10-CM

## 2016-07-18 DIAGNOSIS — R739 Hyperglycemia, unspecified: Secondary | ICD-10-CM | POA: Diagnosis not present

## 2016-07-18 DIAGNOSIS — Z125 Encounter for screening for malignant neoplasm of prostate: Secondary | ICD-10-CM | POA: Diagnosis not present

## 2016-07-18 DIAGNOSIS — Z1159 Encounter for screening for other viral diseases: Secondary | ICD-10-CM

## 2016-07-18 DIAGNOSIS — E559 Vitamin D deficiency, unspecified: Secondary | ICD-10-CM

## 2016-07-18 DIAGNOSIS — Z0000 Encounter for general adult medical examination without abnormal findings: Secondary | ICD-10-CM | POA: Diagnosis not present

## 2016-07-18 DIAGNOSIS — E538 Deficiency of other specified B group vitamins: Secondary | ICD-10-CM | POA: Diagnosis not present

## 2016-07-18 DIAGNOSIS — E784 Other hyperlipidemia: Secondary | ICD-10-CM | POA: Diagnosis not present

## 2016-07-18 DIAGNOSIS — R748 Abnormal levels of other serum enzymes: Secondary | ICD-10-CM | POA: Diagnosis not present

## 2016-07-18 DIAGNOSIS — E038 Other specified hypothyroidism: Secondary | ICD-10-CM

## 2016-07-18 LAB — VITAMIN D 25 HYDROXY (VIT D DEFICIENCY, FRACTURES): VITD: 56.88 ng/mL (ref 30.00–100.00)

## 2016-07-18 LAB — BASIC METABOLIC PANEL
BUN: 53 mg/dL — ABNORMAL HIGH (ref 6–23)
CALCIUM: 9.7 mg/dL (ref 8.4–10.5)
CO2: 27 meq/L (ref 19–32)
CREATININE: 2.68 mg/dL — AB (ref 0.40–1.50)
Chloride: 98 mEq/L (ref 96–112)
GFR: 25.14 mL/min — AB (ref >60.00)
GLUCOSE: 219 mg/dL — AB (ref 70–99)
Potassium: 4.8 mEq/L (ref 3.5–5.1)
Sodium: 136 mEq/L (ref 135–145)

## 2016-07-18 LAB — HEMOGLOBIN A1C: Hgb A1c MFr Bld: 6.8 % — ABNORMAL HIGH (ref 4.6–6.5)

## 2016-07-18 LAB — LDL CHOLESTEROL, DIRECT: LDL DIRECT: 218 mg/dL

## 2016-07-18 LAB — LIPID PANEL
Cholesterol: 338 mg/dL — ABNORMAL HIGH (ref 0–200)
HDL: 47.9 mg/dL (ref >39.00)
Total CHOL/HDL Ratio: 7
Triglycerides: 500 mg/dL — ABNORMAL HIGH (ref 0.0–149.0)

## 2016-07-18 LAB — VITAMIN B12

## 2016-07-18 LAB — HEPATIC FUNCTION PANEL
ALK PHOS: 94 U/L (ref 39–117)
ALT: 35 U/L (ref 0–53)
AST: 33 U/L (ref 0–37)
Albumin: 3.9 g/dL (ref 3.5–5.2)
BILIRUBIN DIRECT: 0.1 mg/dL (ref 0.0–0.3)
BILIRUBIN TOTAL: 0.6 mg/dL (ref 0.2–1.2)
Total Protein: 6.5 g/dL (ref 6.0–8.3)

## 2016-07-18 LAB — PSA, MEDICARE: PSA: 3.37 ng/ml (ref 0.10–4.00)

## 2016-07-18 LAB — TSH: TSH: 2.64 u[IU]/mL (ref 0.35–4.50)

## 2016-07-18 NOTE — Patient Instructions (Signed)
Edward Oconnell , Thank you for taking time to come for your Medicare Wellness Visit. I appreciate your ongoing commitment to your health goals. Please review the following plan we discussed and let me know if I can assist you in the future.   These are the goals we discussed: Goals    Starting 07/18/2016, I will continue to drink 2 gallons of water daily.       This is a list of the screening recommended for you and due dates:  Health Maintenance  Topic Date Due  . Complete foot exam   07/25/2016*  . Hemoglobin A1C  08/23/2016  . Eye exam for diabetics  05/13/2017  . Urine Protein Check  07/18/2017  . Colon Cancer Screening  10/13/2018  . Tetanus Vaccine  09/12/2020  . Flu Shot  Addressed  . Shingles Vaccine  Addressed  .  Hepatitis C: One time screening is recommended by Center for Disease Control  (CDC) for  adults born from [birth year] through 1965.   Completed  . Pneumonia vaccines  Completed  *Topic was postponed. The date shown is not the original due date.   Preventive Care for Adults  A healthy lifestyle and preventive care can promote health and wellness. Preventive health guidelines for adults include the following key practices.  . A routine yearly physical is a good way to check with your health care provider about your health and preventive screening. It is a chance to share any concerns and updates on your health and to receive a thorough exam.  . Visit your dentist for a routine exam and preventive care every 6 months. Brush your teeth twice a day and floss once a day. Good oral hygiene prevents tooth decay and gum disease.  . The frequency of eye exams is based on your age, health, family medical history, use  of contact lenses, and other factors. Follow your health care provider's ecommendations for frequency of eye exams.  . Eat a healthy diet. Foods like vegetables, fruits, whole grains, low-fat dairy products, and lean protein foods contain the nutrients you need without  too many calories. Decrease your intake of foods high in solid fats, added sugars, and salt. Eat the right amount of calories for you. Get information about a proper diet from your health care provider, if necessary.  . Regular physical exercise is one of the most important things you can do for your health. Most adults should get at least 150 minutes of moderate-intensity exercise (any activity that increases your heart rate and causes you to sweat) each week. In addition, most adults need muscle-strengthening exercises on 2 or more days a week.  Silver Sneakers may be a benefit available to you. To determine eligibility, you may visit the website: www.silversneakers.com or contact program at [phone] Mon-Fri between 8AM-8PM.   . Maintain a healthy weight. The body mass index (BMI) is a screening tool to identify possible weight problems. It provides an estimate of body fat based on height and weight. Your health care provider can find your BMI and can help you achieve or maintain a healthy weight.   For adults 20 years and older: ? A BMI below 18.5 is considered underweight. ? A BMI of 18.5 to 24.9 is normal. ? A BMI of 25 to 29.9 is considered overweight. ? A BMI of 30 and above is considered obese.   . Maintain normal blood lipids and cholesterol levels by exercising and minimizing your intake of saturated fat. Eat a balanced diet  with plenty of fruit and vegetables. Blood tests for lipids and cholesterol should begin at age 40 and be repeated every 5 years. If your lipid or cholesterol levels are high, you are over 50, or you are at high risk for heart disease, you may need your cholesterol levels checked more frequently. Ongoing high lipid and cholesterol levels should be treated with medicines if diet and exercise are not working.  . If you smoke, find out from your health care provider how to quit. If you do not use tobacco, please do not start.  . If you choose to drink alcohol,  please do not consume more than 2 drinks per day. One drink is considered to be 12 ounces (355 mL) of beer, 5 ounces (148 mL) of wine, or 1.5 ounces (44 mL) of liquor.  . If you are 54-38 years old, ask your health care provider if you should take aspirin to prevent strokes.  . Use sunscreen. Apply sunscreen liberally and repeatedly throughout the day. You should seek shade when your shadow is shorter than you. Protect yourself by wearing long sleeves, pants, a wide-brimmed hat, and sunglasses year round, whenever you are outdoors.  . Once a month, do a whole body skin exam, using a mirror to look at the skin on your back. Tell your health care provider of new moles, moles that have irregular borders, moles that are larger than a pencil eraser, or moles that have changed in shape or color.

## 2016-07-18 NOTE — Progress Notes (Signed)
Subjective:   Edward Oconnell is a 70 y.o. male who presents for Medicare Annual/Subsequent preventive examination.  Review of Systems:  N/A Cardiac Risk Factors include: advanced age (>12mn, >>71women);male gender;dyslipidemia;hypertension     Objective:    Vitals: BP 92/70 (BP Location: Right Arm, Patient Position: Sitting, Cuff Size: Normal)   Pulse 75   Temp 97.7 F (36.5 C) (Oral)   Ht 5' 6.5" (1.689 m) Comment: no shoes  Wt 145 lb 8 oz (66 kg)   SpO2 97%   BMI 23.13 kg/m   Body mass index is 23.13 kg/m.  Tobacco History  Smoking Status  . Never Smoker  Smokeless Tobacco  . Never Used     Counseling given: No   Past Medical History:  Diagnosis Date  . Altered mental state    [facility] . Benign prostatic hypertrophy    a. Nonobstructive on 2004 cath b. anterior STEMI s/p DES-pLAD 12/05/12  . CAD (coronary artery disease)   . Cardiac arrest ([place] [place] 12/05/2012   In the setting of anterior STEMI  . Chronic kidney disease (CKD), stage IV (severe) ([facility] 12/08/2012   [facility]  . [place] mellitus [place] complication [place]   . GERD (gastroesophageal reflux disease)   . History of shingles 08/2012   Lt eye shingles  . Hyperlipidemia   . Hypertension   . Hypothyroidism   . 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 Creat was 6 when he [facility]  According to patient he did OK then in the first few weeks after surgery had to go back in hospital for "BP" problems that "messed the kidney up". Since then creatinine has been in 2.3-3.0 range.  [facility] gets most care there.  Takes prograf and Myfortic, no steroids.  PKD kidneys were left in place.    . Skin cancer   . Stroke ([facility]   Past Surgical History:  Procedure Laterality Date  . CARDIAC CATHETERIZATION  2004   Nonobstructive  . CORONARY  ANGIOPLASTY WITH STENT PLACEMENT  12/05/2012   30% oLAD, 20% mLAD, 95-99% pLAD s/p DES, 60% oD1, 70-80% pRamus, 40% pLCx, 50% AV groove Cx, 50% PLB, 100% mRCA s/p recanalization intra-procedurally, noted to be small non-dominant with 70% pRCA stenosis post-procedure  . ENUCLEATION  2016   R eye  . EYE SURGERY    . FACIAL RECONSTRUCTION SURGERY  2015   [facility]  . KIDNEY TRANSPLANT     102-05-12 . LEFT HEART CATHETERIZATION WITH CORONARY ANGIOGRAM N/A 12/05/2012   Procedure: LEFT HEART CATHETERIZATION WITH CORONARY ANGIOGRAM;  Surgeon: CBurnell Blanks MD;  [facility];  Service: Cardiovascular;  Laterality: N/A;  . OTHER SURGICAL HISTORY  11/21/12   cancerous cells removed from mid upper chest  . OTHER SURGICAL HISTORY N/A 11/15/2012   pt had skin cancer removed from mid upper chest   Family History  Problem Relation Age of Onset  . Kidney failure Mother     was on dialysis 3 times a week when she died  . Alcohol abuse Mother   . Hypertension Father   . Heart attack Sister   . Kidney disease Sister   . Aneurysm Sister   . COPD Sister   . Colon cancer Neg Hx   . Prostate cancer Neg Hx  History  Sexual Activity  . Sexual activity: Yes    Outpatient Encounter Prescriptions as of 07/18/2016  Medication Sig  . amLODipine (NORVASC) 5 MG tablet Take 5 mg by mouth daily.  Marland Kitchen aspirin EC 81 MG tablet TAKE 1 TABLET BY MOUTH DAILY.  Marland Kitchen atorvastatin (LIPITOR) 10 MG tablet Take 10 mg by mouth daily.  . Cholecalciferol (VITAMIN D-1000 MAX ST) 1000 UNITS tablet Take 1,000 Units by mouth.   . clopidogrel (PLAVIX) 75 MG tablet TAKE 1 TABLET (75 MG TOTAL) BY MOUTH DAILY.  Marland Kitchen diltiazem (CARDIZEM) 120 MG tablet Take 120 mg by mouth daily.  . fluconazole (DIFLUCAN) 100 MG tablet Take 50 mg by mouth daily.   . furosemide (LASIX) 20 MG tablet Take 20 mg by mouth daily.  Marland Kitchen glimepiride (AMARYL) 1 MG tablet TAKE 1 TABLET (1 MG TOTAL) BY MOUTH DAILY WITH BREAKFAST.  . hydrALAZINE (APRESOLINE) 25  MG tablet Take 25 mg by mouth 3 (three) times daily.   Marland Kitchen levothyroxine (SYNTHROID, LEVOTHROID) 150 MCG tablet TAKE 1 TABLET (150 MCG TOTAL) BY MOUTH DAILY BEFORE BREAKFAST.  . magnesium oxide (MAG-OX) 400 MG tablet Take 400 mg by mouth 2 (two) times daily.   . metoprolol (LOPRESSOR) 50 MG tablet Take 25 mg by mouth daily.  . pantoprazole (PROTONIX) 40 MG tablet TAKE 1 TABLET (40 MG TOTAL) BY MOUTH DAILY.  Marland Kitchen predniSONE (DELTASONE) 10 MG tablet Take 10 mg by mouth daily with breakfast.  . sertraline (ZOLOFT) 100 MG tablet Take 100 mg by mouth daily.   . sodium bicarbonate 650 MG tablet Take 650 mg by mouth 2 (two) times daily.   . nitroGLYCERIN (NITROSTAT) 0.4 MG SL tablet Place 1 tablet (0.4 mg total) under the tongue every 5 (five) minutes x 3 doses as needed for chest pain. (Patient not taking: Reported on 07/18/2016)   No facility-administered encounter medications on file as of 07/18/2016.     Activities of Daily Living In your present state of health, do you have any difficulty performing the following activities: 07/18/2016  Hearing? N  Vision? Y  Difficulty concentrating or making decisions? N  Walking or climbing stairs? Y  Dressing or bathing? N  Doing errands, shopping? N  Preparing Food and eating ? N  Using the Toilet? N  In the past six months, have you accidently leaked urine? Y  Do you have problems with loss of bowel control? N  Managing your Medications? Y  Managing your Finances? N  Housekeeping or managing your Housekeeping? N  Some recent data might be hidden    Patient Care Team: Tonia Ghent, MD as PCP - General (Family Medicine) Shea Stakes, MD (Internal Medicine) Estill Cotta, MD as Consulting Physician (Ophthalmology)   Assessment:     Hearing Screening   '125Hz'$  '250Hz'$  '500Hz'$  '1000Hz'$  '2000Hz'$  '3000Hz'$  '4000Hz'$  '6000Hz'$  '8000Hz'$   Right ear:   40 40 40  0    Left ear:   40 0 40  0    Vision Screening Comments: Last vision exam with Dr. Sandra Cockayne in Sept  2017   Exercise Activities and Dietary recommendations Current Exercise Habits: The patient has a physically strenous job, but has no regular exercise apart from work. (pt owns [employer]; works 6 days per week), Exercise limited by: None identified  Goals    . Increase water intake          Starting 07/18/2016, I will continue to drink at least 2 gallons of water daily.  Fall Risk Fall Risk  07/18/2016  Falls in the past year? Yes  Number falls in past yr: 1  Injury with Fall? Yes  Follow up Falls evaluation completed;Falls prevention discussed   Depression Screen PHQ 2/9 Scores 07/18/2016  PHQ - 2 Score 0    Cognitive Function MMSE - Mini Mental State Exam 07/18/2016  Orientation to time 5  Orientation to Place 5  Registration 3  Attention/ Calculation 0  Recall 3  Language- name 2 objects 0  Language- repeat 1  Language- follow 3 step command 3  Language- read & follow direction 0  Write a sentence 0  Copy design 0  Total score 20       PLEASE NOTE: A Mini-Cog screen was completed. Maximum score is 20. A value of 0 denotes this part of Folstein MMSE was not completed or the patient failed this part of the Mini-Cog screening.   Mini-Cog Screening Orientation to Time - Max 5 pts Orientation to Place - Max 5 pts Registration - Max 3 pts Recall - Max 3 pts Language Repeat - Max 1 pts Language Follow 3 Step Command - Max 3 pts   Immunization History  Administered Date(s) Administered  . Influenza Whole 07/13/2005  . Influenza,inj,Quad PF,36+ Mos 12/11/2014, 07/18/2016  . Influenza-Unspecified 07/13/2013  . Pneumococcal Conjugate-13 07/18/2016  . Pneumococcal Polysaccharide-23 08/12/2013  . Td 09/13/2001   Screening Tests Health Maintenance  Topic Date Due  . FOOT EXAM  07/25/2016 (Originally 09/14/2013)  . HEMOGLOBIN A1C  08/23/2016  . OPHTHALMOLOGY EXAM  05/13/2017  . URINE MICROALBUMIN  07/18/2017  . COLONOSCOPY  10/13/2018  . TETANUS/TDAP   09/12/2020  . INFLUENZA VACCINE  Addressed  . ZOSTAVAX  Addressed  . Hepatitis C Screening  Completed  . PNA vac Low Risk Adult  Completed      Plan:     I have personally reviewed and addressed the Medicare Annual Wellness questionnaire and have noted the following in the patient's chart:  A. Medical and social history B. Use of alcohol, tobacco or illicit drugs  C. Current medications and supplements D. Functional ability and status E.  Nutritional status F.  Physical activity G. Advance directives H. List of other physicians I.  Hospitalizations, surgeries, and ER visits in previous 12 months J.  [place] to include hearing, vision, cognitive, depression L. Referrals and appointments - none  In addition, I have reviewed and discussed with patient certain preventive protocols, quality metrics, and best practice recommendations. A written personalized care plan for preventive services as well as general preventive health recommendations were provided to patient.  See attached scanned questionnaire for additional information.   Signed,   Lindell Noe, MHA, BS, LPN Health Coach

## 2016-07-18 NOTE — Progress Notes (Signed)
Pre visit review using our clinic review tool, if applicable. No additional management support is needed unless otherwise documented below in the visit note. 

## 2016-07-18 NOTE — Progress Notes (Signed)
PCP notes:   Health maintenance:  Flu vaccine - administered PCV13 - administered Hep C screening - completed Foot exam - PCP will determine if exam is necessary Urine microalbumin - completed  Abnormal screenings:   Fall risk - hx of fall with injury Hearing - failed   Patient concerns:   Pt has complaint of chronic fatigue. Also, pt wants to discuss unintentional weight loss with PCP. Reviewed daily intake based on pt report. Pt was encouraged to drink 3 Ensures daily and increase protein intake at evening meal. Pt verbalized food has lost taste due to the medications taken.   Nurse concerns:  Pt's BP was 92/70. Pt stated he had recently taken afternoon BP medications. Updated medication list with several medications, including BP meds. Encouraged pt to take BP upon rising and at bedtime. Pt was asked to record these readings and share with PCP at next appt.   Next PCP appt:   07/25/16 @ 1215  I reviewed health advisor's note, was available for consultation on the day of service listed in this note, and agree with documentation and plan. Elsie Stain, MD.

## 2016-07-19 LAB — MICROALBUMIN / CREATININE URINE RATIO
Creatinine,U: 50.1 mg/dL
Microalb Creat Ratio: 1.8 mg/g (ref 0.0–30.0)
Microalb, Ur: 0.9 mg/dL (ref 0.0–1.9)

## 2016-07-19 LAB — HEPATITIS C ANTIBODY: HCV AB: NEGATIVE

## 2016-07-23 ENCOUNTER — Other Ambulatory Visit (HOSPITAL_COMMUNITY): Payer: Self-pay | Admitting: Internal Medicine

## 2016-07-25 ENCOUNTER — Ambulatory Visit (INDEPENDENT_AMBULATORY_CARE_PROVIDER_SITE_OTHER): Payer: Medicare Other | Admitting: Family Medicine

## 2016-07-25 ENCOUNTER — Encounter: Payer: Self-pay | Admitting: Family Medicine

## 2016-07-25 DIAGNOSIS — R5383 Other fatigue: Secondary | ICD-10-CM

## 2016-07-25 DIAGNOSIS — N529 Male erectile dysfunction, unspecified: Secondary | ICD-10-CM

## 2016-07-25 DIAGNOSIS — E782 Mixed hyperlipidemia: Secondary | ICD-10-CM

## 2016-07-25 DIAGNOSIS — E1122 Type 2 diabetes mellitus with diabetic chronic kidney disease: Secondary | ICD-10-CM

## 2016-07-25 DIAGNOSIS — N184 Chronic kidney disease, stage 4 (severe): Secondary | ICD-10-CM

## 2016-07-25 DIAGNOSIS — I2510 Atherosclerotic heart disease of native coronary artery without angina pectoris: Secondary | ICD-10-CM | POA: Diagnosis not present

## 2016-07-25 MED ORDER — FLUCONAZOLE 100 MG PO TABS: 100.0000 mg | ORAL_TABLET | Freq: Every day | ORAL | Status: DC

## 2016-07-25 NOTE — Progress Notes (Signed)
Prev issues d/w pt: Flu vaccine - administered PCV13 - administered Hep C screening - completed Urine microalbumin - completed  Abnormal screenings:   Fall risk - hx of fall with injury.  D/w pt about fall cautions.  Hearing - failed.  Declined hearing aids.    ED.  No help with viagra prev.  His health overall is better than at the time of prev viagra trial. CKD noted.  D/w pt about possible levitra use, but I would need renal input.   Ongoing fatigue noted.  BP lower at last OV.  Improved on home checks (130-140/70-80s, pulse 56-73), similar to today at OV.  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  Labs d/w pt.  HGB 13.8 on check at [facility] recently.    Diabetes:  Using medications without difficulties:yes Hypoglycemic episodes:no Hyperglycemic episodes: no Feet problems: no Blood Sugars averaging: 90-150, had been gradually increasing recently. eye exam within last year: yes  Elevated Cholesterol: Using medications without problems:yes Muscle aches: no Diet compliance: yes Exercise:yes, yardwork  CKD.  Creatinine to baseline. Discussed with patient. Followed at [place] on baseline medications. Status post transplant. He is avoiding NSAIDs.  Advance directive d/w pt.  Wife designated if patient were incapacitated.    PMH and SH reviewed  Meds, vitals, and allergies reviewed.   ROS: Per HPI unless specifically indicated in ROS section   GEN: nad, alert and oriented HEENT: mucous membranes moist, R eye s/p surgery.  NECK: supple w/o LA CV: rrr. PULM: ctab, no inc wob ABD: soft, +bs EXT: no edema SKIN: no acute rash  Diabetic foot exam: Normal inspection No skin breakdown No calluses  Normal DP pulses Normal sensation to light touch and monofilament Nails normal

## 2016-07-25 NOTE — Progress Notes (Signed)
Pre visit review using our clinic review tool, if applicable. No additional management support is needed unless otherwise documented below in the visit note. 

## 2016-07-25 NOTE — Patient Instructions (Signed)
Don't change your meds for now.  I'll check with cardiology and the renal clinic.  Recheck A1c in about 6 months.  If your sugar keeps going up, then let me know.  Take care.  Glad to see you.

## 2016-07-26 DIAGNOSIS — R5383 Other fatigue: Secondary | ICD-10-CM | POA: Insufficient documentation

## 2016-07-26 NOTE — Assessment & Plan Note (Signed)
A1c controlled. Microalbumin improved. Still on prednisone. Continue medications as is for now. If he continues to have sugar elevation, he will let me know. Otherwise recheck periodically. He agrees.

## 2016-07-26 NOTE — Assessment & Plan Note (Signed)
Creatinine baseline. Avoiding NSAIDs. Still on baseline medications. Blood pressure is reasonable today.

## 2016-07-26 NOTE — Assessment & Plan Note (Signed)
He may be able to use Levitra, but I need renal input first. I will last for advice.

## 2016-07-26 NOTE — Assessment & Plan Note (Signed)
Likely no single cause.  He isn't anemic. His TSH is normal. This is more likely to be a consequence of normal aging and result of multiple overlapping chronic illnesses. Discussed with patient. I don't see an issue that I can address at this point that would likely completely resolve the fatigue.

## 2016-07-26 NOTE — Assessment & Plan Note (Signed)
Lipids above goal. On statin. LFTs normalized off amiodarone. I will check with cardiology about increase in statin.

## 2016-07-28 ENCOUNTER — Encounter: Payer: Self-pay | Admitting: *Deleted

## 2016-07-28 ENCOUNTER — Telehealth: Payer: Self-pay | Admitting: *Deleted

## 2016-07-28 DIAGNOSIS — Z7952 Long term (current) use of systemic steroids: Secondary | ICD-10-CM | POA: Diagnosis not present

## 2016-07-28 DIAGNOSIS — Z8639 Personal history of other endocrine, nutritional and metabolic disease: Secondary | ICD-10-CM | POA: Diagnosis not present

## 2016-07-28 DIAGNOSIS — R918 Other nonspecific abnormal finding of lung field: Secondary | ICD-10-CM | POA: Diagnosis not present

## 2016-07-28 DIAGNOSIS — I252 Old myocardial infarction: Secondary | ICD-10-CM | POA: Diagnosis not present

## 2016-07-28 DIAGNOSIS — I480 Paroxysmal atrial fibrillation: Secondary | ICD-10-CM | POA: Diagnosis not present

## 2016-07-28 DIAGNOSIS — D899 Disorder involving the immune mechanism, unspecified: Secondary | ICD-10-CM | POA: Diagnosis not present

## 2016-07-28 DIAGNOSIS — Q612 Polycystic kidney, adult type: Secondary | ICD-10-CM | POA: Diagnosis not present

## 2016-07-28 DIAGNOSIS — Z79899 Other long term (current) drug therapy: Secondary | ICD-10-CM | POA: Diagnosis not present

## 2016-07-28 DIAGNOSIS — Z940 Kidney transplant status: Secondary | ICD-10-CM | POA: Diagnosis not present

## 2016-07-28 DIAGNOSIS — I272 Pulmonary hypertension, unspecified: Secondary | ICD-10-CM | POA: Diagnosis not present

## 2016-07-28 DIAGNOSIS — I129 Hypertensive chronic kidney disease with stage 1 through stage 4 chronic kidney disease, or unspecified chronic kidney disease: Secondary | ICD-10-CM | POA: Diagnosis not present

## 2016-07-28 DIAGNOSIS — B9789 Other viral agents as the cause of diseases classified elsewhere: Secondary | ICD-10-CM | POA: Diagnosis not present

## 2016-07-28 DIAGNOSIS — N184 Chronic kidney disease, stage 4 (severe): Secondary | ICD-10-CM | POA: Diagnosis not present

## 2016-07-28 DIAGNOSIS — Z8673 Personal history of transient ischemic attack (TIA), and cerebral infarction without residual deficits: Secondary | ICD-10-CM | POA: Diagnosis not present

## 2016-07-28 DIAGNOSIS — R740 Nonspecific elevation of levels of transaminase and lactic acid dehydrogenase [LDH]: Secondary | ICD-10-CM | POA: Diagnosis not present

## 2016-07-28 DIAGNOSIS — Z7982 Long term (current) use of aspirin: Secondary | ICD-10-CM | POA: Diagnosis not present

## 2016-07-28 DIAGNOSIS — Z4822 Encounter for aftercare following kidney transplant: Secondary | ICD-10-CM | POA: Diagnosis not present

## 2016-07-28 DIAGNOSIS — I2510 Atherosclerotic heart disease of native coronary artery without angina pectoris: Secondary | ICD-10-CM | POA: Diagnosis not present

## 2016-07-28 DIAGNOSIS — E7800 Pure hypercholesterolemia, unspecified: Secondary | ICD-10-CM | POA: Diagnosis not present

## 2016-07-28 DIAGNOSIS — Z7902 Long term (current) use of antithrombotics/antiplatelets: Secondary | ICD-10-CM | POA: Diagnosis not present

## 2016-07-28 DIAGNOSIS — E1122 Type 2 diabetes mellitus with diabetic chronic kidney disease: Secondary | ICD-10-CM | POA: Diagnosis not present

## 2016-07-28 DIAGNOSIS — E039 Hypothyroidism, unspecified: Secondary | ICD-10-CM | POA: Diagnosis not present

## 2016-07-28 NOTE — Telephone Encounter (Signed)
Faxed a note to Dr. Franchot Gallo, awaiting response.

## 2016-07-29 DIAGNOSIS — D485 Neoplasm of uncertain behavior of skin: Secondary | ICD-10-CM | POA: Diagnosis not present

## 2016-07-29 DIAGNOSIS — X32XXXA Exposure to sunlight, initial encounter: Secondary | ICD-10-CM | POA: Diagnosis not present

## 2016-07-29 DIAGNOSIS — Z85828 Personal history of other malignant neoplasm of skin: Secondary | ICD-10-CM | POA: Diagnosis not present

## 2016-07-29 DIAGNOSIS — L570 Actinic keratosis: Secondary | ICD-10-CM | POA: Diagnosis not present

## 2016-07-29 DIAGNOSIS — C44629 Squamous cell carcinoma of skin of left upper limb, including shoulder: Secondary | ICD-10-CM | POA: Diagnosis not present

## 2016-07-31 ENCOUNTER — Telehealth: Payer: Self-pay | Admitting: Family Medicine

## 2016-07-31 ENCOUNTER — Other Ambulatory Visit: Payer: Self-pay | Admitting: Family Medicine

## 2016-07-31 DIAGNOSIS — E782 Mixed hyperlipidemia: Secondary | ICD-10-CM

## 2016-07-31 MED ORDER — ATORVASTATIN CALCIUM 20 MG PO TABS: 20.0000 mg | ORAL_TABLET | Freq: Every day | ORAL | 3 refills | Status: DC

## 2016-07-31 NOTE — Telephone Encounter (Signed)
-----   Message from Josetta Huddle, [place] sent at 07/28/2016  5:21 PM EST ----- Faxed a note to Dr. Franchot Gallo, awaiting response. ----- Message ----- From: Tonia Ghent, MD Sent: 07/26/2016  10:45 AM To: Josetta Huddle, CMA  I haven't talked to [facility] yet.  i'm okay with contacting [facility] however you think is best.  Epic?  Thanks.  Brigitte Pulse  ----- Message ----- From: Josetta Huddle, CMA Sent: 07/26/2016   9:50 AM To: Tonia Ghent, MD  I have sent the labs and OV note to Dr. Franchot Gallo at [facility].  Do I need to contact Dr. Franchot Gallo about the Levitra or have you already done so? ----- Message ----- From: Tonia Ghent, MD Sent: 07/26/2016   6:11 AM To: Josetta Huddle, CMA  I need labs and OV note to go to renal- Dr. Franchot Gallo at [facility].  I need input from Dr. Franchot Gallo if okay for patient to try levitra, given his renal function.  Thanks.   Brigitte Pulse

## 2016-07-31 NOTE — Progress Notes (Signed)
Call pt.  I checked with cardiology.  Reasonable to inc atorvastatin from '10mg'$  to '20mg'$ .  Can take 2 of the '10mg'$  tabs at night for now.  I sent new rx for '20mg'$  when he runs out of current rx.  If new/worse muscle aches on the higher dose, then cut back to '10mg'$  and update me.  If tolerated, recheck fasting lipids and LFTs in about 6 weeks.  Orders are in.  Thanks.   Wife advised.  Lab appt scheduled.  Mike Craze, CMA  08/01/2016

## 2016-08-01 ENCOUNTER — Other Ambulatory Visit: Payer: Self-pay | Admitting: Family Medicine

## 2016-08-03 ENCOUNTER — Other Ambulatory Visit: Payer: Self-pay | Admitting: Family Medicine

## 2016-08-03 MED ORDER — VARDENAFIL HCL 10 MG PO TABS: 10.0000 mg | ORAL_TABLET | Freq: Every day | ORAL | 99 refills | Status: AC | PRN

## 2016-08-03 NOTE — Telephone Encounter (Signed)
Call pt.  Got in contact with renal clinic at [facility].  From a purely renal standpoint, they are okay with levitra use.  Two issues remain- he can't use with NTG.  If any NTG use, then don't use levitra.  Also with possible vision changes on the med.  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   If he wants to proceed with medicine, then please send in.  Med pended in meantime.  If patient declines, please remove med from med list.  Thanks.

## 2016-08-03 NOTE — Telephone Encounter (Signed)
Pt was not available. Advised his wife to have him call back on Monday

## 2016-08-08 NOTE — Telephone Encounter (Signed)
Patient advised and declines the medication.  Patient says it is not worth the risk.

## 2016-08-17 DIAGNOSIS — L905 Scar conditions and fibrosis of skin: Secondary | ICD-10-CM | POA: Diagnosis not present

## 2016-08-17 DIAGNOSIS — C44629 Squamous cell carcinoma of skin of left upper limb, including shoulder: Secondary | ICD-10-CM | POA: Diagnosis not present

## 2016-08-31 DIAGNOSIS — H169 Unspecified keratitis: Secondary | ICD-10-CM | POA: Diagnosis not present

## 2016-09-04 ENCOUNTER — Other Ambulatory Visit: Payer: Self-pay | Admitting: Cardiovascular Disease

## 2016-09-13 ENCOUNTER — Other Ambulatory Visit: Payer: Medicare Other

## 2016-09-20 ENCOUNTER — Other Ambulatory Visit (INDEPENDENT_AMBULATORY_CARE_PROVIDER_SITE_OTHER): Payer: Medicare Other

## 2016-09-20 DIAGNOSIS — E782 Mixed hyperlipidemia: Secondary | ICD-10-CM | POA: Diagnosis not present

## 2016-09-20 LAB — HEPATIC FUNCTION PANEL
ALK PHOS: 114 U/L (ref 39–117)
ALT: 107 U/L — ABNORMAL HIGH (ref 0–53)
AST: 83 U/L — ABNORMAL HIGH (ref 0–37)
Albumin: 3.7 g/dL (ref 3.5–5.2)
BILIRUBIN DIRECT: 0.1 mg/dL (ref 0.0–0.3)
TOTAL PROTEIN: 6.6 g/dL (ref 6.0–8.3)
Total Bilirubin: 0.8 mg/dL (ref 0.2–1.2)

## 2016-09-20 LAB — LIPID PANEL
Cholesterol: 162 mg/dL (ref 0–200)
HDL: 46.7 mg/dL (ref >39.00)
NONHDL: 115.4
TRIGLYCERIDES: 220 mg/dL — AB (ref 0.0–149.0)
Total CHOL/HDL Ratio: 3
VLDL: 44 mg/dL — AB (ref 0.0–40.0)

## 2016-09-20 LAB — LDL CHOLESTEROL, DIRECT: LDL DIRECT: 74 mg/dL

## 2016-09-22 DIAGNOSIS — N189 Chronic kidney disease, unspecified: Secondary | ICD-10-CM | POA: Diagnosis not present

## 2016-09-23 ENCOUNTER — Telehealth: Payer: Self-pay | Admitting: Cardiovascular Disease

## 2016-09-23 NOTE — Telephone Encounter (Signed)
The pt called and stated that his PCP, Dr Damita Dunnings, is recommending that he decrease his Atorvastatin to 10 mg daily due to his resent lab results (lab results are in the pts chart).  Notes Recorded by Tonia Ghent, MD on 09/22/2016 at 9:24 AM EST Lipids much improved, mild inc in LFTs noted. Not as severe as prev. I need cards and GI input on this. Routed to Dr. Julianne Handler.   Since it looks like Dr Damita Dunnings wants to discuss with Dr Julianne Handler and GI I have not made a change to the pts Atorvastatin dose.  Will forward to Dr Julianne Handler and his nurse, Fraser Din.

## 2016-09-23 NOTE — Telephone Encounter (Signed)
Atorvastatin decreased to 10 mg daily in the pts chart.

## 2016-09-23 NOTE — Telephone Encounter (Signed)
New Message  Pt voiced pcp wants pt to come off cholestorol from 20 to 10 mg.  Please f/u with pt

## 2016-09-23 NOTE — Telephone Encounter (Signed)
I agree with that plan. Edward Oconnell

## 2016-09-27 ENCOUNTER — Telehealth: Payer: Self-pay | Admitting: *Deleted

## 2016-09-27 ENCOUNTER — Other Ambulatory Visit: Payer: Self-pay

## 2016-09-27 NOTE — Telephone Encounter (Signed)
Pt is asking for a rx for his glucometer test strips. He said he has [employer] for Part D plan.? Says he uses One Touch Ultra 2.

## 2016-09-27 NOTE — Telephone Encounter (Signed)
Beth nurse with Ronie Spies office at [facility] left a voicemail wanting to just let you know that patient's liver enzymes were up at last check. Beth stated that they will be repeating the test in 1-2 weeks.

## 2016-09-29 NOTE — Telephone Encounter (Signed)
Please tell Dr. Franchot Gallo that I know about this and I'm still waiting to hear back from Maryland Pink, MD for input.  His address is: [address]  [place], [place] [address]  [phone]  [phone] (Fax)   Please see what you can find about his preference on the patient's LFTs.  Thanks.

## 2016-09-29 NOTE — Telephone Encounter (Signed)
Please send in order for test strips, check sugar daily.  #100 strips, 3 rf.  Dx: E11.9.  Thanks.

## 2016-09-30 MED ORDER — GLUCOSE BLOOD VI STRP: ORAL_STRIP | 3 refills | Status: AC

## 2016-09-30 NOTE — Telephone Encounter (Signed)
rx for test strips sent electronically.

## 2016-10-03 ENCOUNTER — Telehealth: Payer: Self-pay | Admitting: Family Medicine

## 2016-10-03 NOTE — Telephone Encounter (Addendum)
Tried to call Dr. Collene Leyden office again, on hold for 12 minutes and finally Amy picked the call up. Beth was not available to come to the phone.  Amy is going to send a message to the  doctor and get them to call back. Amy stated that she will send a message over to Dr. Gertie Fey also.   Amy stated that Dr. Damita Dunnings does not hear from the doctors regarding this patient he can call Physician Access Line and have them paged to call him back. [phone]

## 2016-10-03 NOTE — Telephone Encounter (Signed)
Call from Dr. Franchot Gallo at [facility].  She is going to update Dr. Gertie Fey at [facility].  Patient is on lipitor '10mg'$ .  Okay to recheck fasting labs here in about 2-3 weeks.   Orders are in.  Thanks.

## 2016-10-03 NOTE — Telephone Encounter (Signed)
Called Beth back at Dr. Collene Leyden office and was on hold for 10 minutes and no one ever picked the call up.

## 2016-10-04 DIAGNOSIS — Z85828 Personal history of other malignant neoplasm of skin: Secondary | ICD-10-CM | POA: Diagnosis not present

## 2016-10-04 DIAGNOSIS — C44119 Basal cell carcinoma of skin of left eyelid, including canthus: Secondary | ICD-10-CM | POA: Diagnosis not present

## 2016-10-04 DIAGNOSIS — Z08 Encounter for follow-up examination after completed treatment for malignant neoplasm: Secondary | ICD-10-CM | POA: Diagnosis not present

## 2016-10-04 DIAGNOSIS — D492 Neoplasm of unspecified behavior of bone, soft tissue, and skin: Secondary | ICD-10-CM | POA: Diagnosis not present

## 2016-10-04 DIAGNOSIS — D485 Neoplasm of uncertain behavior of skin: Secondary | ICD-10-CM | POA: Diagnosis not present

## 2016-10-05 DIAGNOSIS — Z955 Presence of coronary angioplasty implant and graft: Secondary | ICD-10-CM | POA: Diagnosis not present

## 2016-10-05 DIAGNOSIS — I252 Old myocardial infarction: Secondary | ICD-10-CM | POA: Diagnosis not present

## 2016-10-05 DIAGNOSIS — Z8673 Personal history of transient ischemic attack (TIA), and cerebral infarction without residual deficits: Secondary | ICD-10-CM | POA: Diagnosis not present

## 2016-10-05 DIAGNOSIS — E119 Type 2 diabetes mellitus without complications: Secondary | ICD-10-CM | POA: Diagnosis not present

## 2016-10-05 DIAGNOSIS — Z923 Personal history of irradiation: Secondary | ICD-10-CM | POA: Diagnosis not present

## 2016-10-05 DIAGNOSIS — Z8619 Personal history of other infectious and parasitic diseases: Secondary | ICD-10-CM | POA: Diagnosis not present

## 2016-10-05 DIAGNOSIS — Z4822 Encounter for aftercare following kidney transplant: Secondary | ICD-10-CM | POA: Diagnosis not present

## 2016-10-05 DIAGNOSIS — Z79899 Other long term (current) drug therapy: Secondary | ICD-10-CM | POA: Diagnosis not present

## 2016-10-05 DIAGNOSIS — I10 Essential (primary) hypertension: Secondary | ICD-10-CM | POA: Diagnosis not present

## 2016-10-05 DIAGNOSIS — I2510 Atherosclerotic heart disease of native coronary artery without angina pectoris: Secondary | ICD-10-CM | POA: Diagnosis not present

## 2016-10-05 DIAGNOSIS — E1122 Type 2 diabetes mellitus with diabetic chronic kidney disease: Secondary | ICD-10-CM | POA: Diagnosis not present

## 2016-10-05 DIAGNOSIS — Z85828 Personal history of other malignant neoplasm of skin: Secondary | ICD-10-CM | POA: Diagnosis not present

## 2016-10-05 DIAGNOSIS — I129 Hypertensive chronic kidney disease with stage 1 through stage 4 chronic kidney disease, or unspecified chronic kidney disease: Secondary | ICD-10-CM | POA: Diagnosis not present

## 2016-10-05 DIAGNOSIS — E039 Hypothyroidism, unspecified: Secondary | ICD-10-CM | POA: Diagnosis not present

## 2016-10-05 DIAGNOSIS — N184 Chronic kidney disease, stage 4 (severe): Secondary | ICD-10-CM | POA: Diagnosis not present

## 2016-10-05 DIAGNOSIS — Z7902 Long term (current) use of antithrombotics/antiplatelets: Secondary | ICD-10-CM | POA: Diagnosis not present

## 2016-10-05 DIAGNOSIS — D899 Disorder involving the immune mechanism, unspecified: Secondary | ICD-10-CM | POA: Diagnosis not present

## 2016-10-05 DIAGNOSIS — D8989 Other specified disorders involving the immune mechanism, not elsewhere classified: Secondary | ICD-10-CM | POA: Diagnosis not present

## 2016-10-05 DIAGNOSIS — Z9889 Other specified postprocedural states: Secondary | ICD-10-CM | POA: Diagnosis not present

## 2016-10-05 DIAGNOSIS — E1165 Type 2 diabetes mellitus with hyperglycemia: Secondary | ICD-10-CM | POA: Diagnosis not present

## 2016-10-05 DIAGNOSIS — Z7952 Long term (current) use of systemic steroids: Secondary | ICD-10-CM | POA: Diagnosis not present

## 2016-10-05 DIAGNOSIS — Z7982 Long term (current) use of aspirin: Secondary | ICD-10-CM | POA: Diagnosis not present

## 2016-10-05 DIAGNOSIS — Q612 Polycystic kidney, adult type: Secondary | ICD-10-CM | POA: Diagnosis not present

## 2016-10-05 DIAGNOSIS — E785 Hyperlipidemia, unspecified: Secondary | ICD-10-CM | POA: Diagnosis not present

## 2016-10-05 DIAGNOSIS — Z940 Kidney transplant status: Secondary | ICD-10-CM | POA: Diagnosis not present

## 2016-10-06 NOTE — Telephone Encounter (Signed)
Pt has a lab appt scheduled for 10/26/16

## 2016-10-10 NOTE — Telephone Encounter (Signed)
Please sign visit if this has been taken care of and you have heard back from MD.

## 2016-10-10 NOTE — Telephone Encounter (Signed)
See other note. Thanks.

## 2016-10-12 ENCOUNTER — Other Ambulatory Visit: Payer: Self-pay

## 2016-10-12 DIAGNOSIS — R221 Localized swelling, mass and lump, neck: Secondary | ICD-10-CM | POA: Diagnosis not present

## 2016-10-12 MED ORDER — LEVOTHYROXINE SODIUM 150 MCG PO TABS: 150.0000 ug | ORAL_TABLET | Freq: Every day | ORAL | 2 refills | Status: DC

## 2016-10-12 MED ORDER — LEVOTHYROXINE SODIUM 150 MCG PO TABS: 150.0000 ug | ORAL_TABLET | Freq: Every day | ORAL | 2 refills | Status: AC

## 2016-10-12 NOTE — Telephone Encounter (Signed)
Rx sent electronically.  

## 2016-10-17 ENCOUNTER — Other Ambulatory Visit: Payer: Self-pay | Admitting: Family Medicine

## 2016-10-17 DIAGNOSIS — E782 Mixed hyperlipidemia: Secondary | ICD-10-CM

## 2016-10-25 DIAGNOSIS — C44119 Basal cell carcinoma of skin of left eyelid, including canthus: Secondary | ICD-10-CM | POA: Diagnosis not present

## 2016-10-25 DIAGNOSIS — Q111 Other anophthalmos: Secondary | ICD-10-CM | POA: Diagnosis not present

## 2016-10-26 ENCOUNTER — Other Ambulatory Visit (INDEPENDENT_AMBULATORY_CARE_PROVIDER_SITE_OTHER): Payer: Medicare Other

## 2016-10-26 DIAGNOSIS — E782 Mixed hyperlipidemia: Secondary | ICD-10-CM

## 2016-10-26 LAB — COMPREHENSIVE METABOLIC PANEL
ALBUMIN: 3.7 g/dL (ref 3.5–5.2)
ALT: 35 U/L (ref 0–53)
AST: 29 U/L (ref 0–37)
Alkaline Phosphatase: 94 U/L (ref 39–117)
BUN: 42 mg/dL — AB (ref 6–23)
CHLORIDE: 104 meq/L (ref 96–112)
CO2: 30 mEq/L (ref 19–32)
CREATININE: 2.24 mg/dL — AB (ref 0.40–1.50)
Calcium: 9.3 mg/dL (ref 8.4–10.5)
GFR: 30.9 mL/min — ABNORMAL LOW (ref >60.00)
Glucose, Bld: 89 mg/dL (ref 70–99)
Potassium: 4.1 mEq/L (ref 3.5–5.1)
Sodium: 141 mEq/L (ref 135–145)
Total Bilirubin: 0.6 mg/dL (ref 0.2–1.2)
Total Protein: 6.4 g/dL (ref 6.0–8.3)

## 2016-10-26 LAB — LIPID PANEL
CHOL/HDL RATIO: 3
CHOLESTEROL: 183 mg/dL (ref 0–200)
HDL: 52.4 mg/dL (ref >39.00)
NONHDL: 130.5
Triglycerides: 229 mg/dL — ABNORMAL HIGH (ref 0.0–149.0)
VLDL: 45.8 mg/dL — ABNORMAL HIGH (ref 0.0–40.0)

## 2016-10-26 LAB — LDL CHOLESTEROL, DIRECT: Direct LDL: 104 mg/dL

## 2016-10-27 DIAGNOSIS — R221 Localized swelling, mass and lump, neck: Secondary | ICD-10-CM | POA: Diagnosis not present

## 2016-10-27 DIAGNOSIS — Z9889 Other specified postprocedural states: Secondary | ICD-10-CM | POA: Diagnosis not present

## 2016-10-27 DIAGNOSIS — Z8673 Personal history of transient ischemic attack (TIA), and cerebral infarction without residual deficits: Secondary | ICD-10-CM | POA: Diagnosis not present

## 2016-10-27 DIAGNOSIS — I639 Cerebral infarction, unspecified: Secondary | ICD-10-CM | POA: Diagnosis not present

## 2016-11-01 ENCOUNTER — Telehealth: Payer: Self-pay | Admitting: Cardiovascular Disease

## 2016-11-01 DIAGNOSIS — I2510 Atherosclerotic heart disease of native coronary artery without angina pectoris: Secondary | ICD-10-CM | POA: Diagnosis not present

## 2016-11-01 DIAGNOSIS — R791 Abnormal coagulation profile: Secondary | ICD-10-CM | POA: Diagnosis not present

## 2016-11-01 DIAGNOSIS — K219 Gastro-esophageal reflux disease without esophagitis: Secondary | ICD-10-CM | POA: Diagnosis not present

## 2016-11-01 DIAGNOSIS — Z940 Kidney transplant status: Secondary | ICD-10-CM | POA: Diagnosis not present

## 2016-11-01 DIAGNOSIS — E039 Hypothyroidism, unspecified: Secondary | ICD-10-CM | POA: Diagnosis not present

## 2016-11-01 DIAGNOSIS — I10 Essential (primary) hypertension: Secondary | ICD-10-CM | POA: Diagnosis not present

## 2016-11-01 DIAGNOSIS — Z01818 Encounter for other preprocedural examination: Secondary | ICD-10-CM | POA: Diagnosis not present

## 2016-11-01 DIAGNOSIS — E118 Type 2 diabetes mellitus with unspecified complications: Secondary | ICD-10-CM | POA: Diagnosis not present

## 2016-11-01 DIAGNOSIS — N183 Chronic kidney disease, stage 3 (moderate): Secondary | ICD-10-CM | POA: Diagnosis not present

## 2016-11-01 DIAGNOSIS — R221 Localized swelling, mass and lump, neck: Secondary | ICD-10-CM | POA: Diagnosis not present

## 2016-11-01 NOTE — Telephone Encounter (Signed)
New message      Request for surgical clearance:  1. What type of surgery is being performed? Left carotidectomy and possible neck dissection  2. When is this surgery scheduled?  11-08-16----please respond as soon as possible since procedure is next week  3. Are there any medications that need to be held prior to surgery and how long? Hold aspirin and plavix prior to procedure?  Name of physician performing surgery?  Dr Vickii Penna What is your office phone and fax number?  [phone]

## 2016-11-02 ENCOUNTER — Encounter: Payer: Self-pay | Admitting: Cardiovascular Disease

## 2016-11-02 NOTE — Telephone Encounter (Signed)
Letter written. cdm

## 2016-11-02 NOTE — Telephone Encounter (Signed)
I placed call to pt. He is unavailable. I spoke with his wife.  She reports pt is doing well from a cardiac standpoint.  No chest pain or shortness of breath.  I told pt's wife Dr. [name] would send letter clearing him for surgery.  Fax number confirmed and letter faxed.

## 2016-11-03 ENCOUNTER — Telehealth: Payer: Self-pay | Admitting: Cardiovascular Disease

## 2016-11-03 NOTE — Telephone Encounter (Signed)
I spoke with Dr. Garald Braver.  Will refax clearance letter to [phone]

## 2016-11-03 NOTE — Telephone Encounter (Signed)
New message    Ronalee Belts calling from [facility] about following up for surgical clearance.

## 2016-11-08 DIAGNOSIS — Z7902 Long term (current) use of antithrombotics/antiplatelets: Secondary | ICD-10-CM | POA: Diagnosis not present

## 2016-11-08 DIAGNOSIS — I129 Hypertensive chronic kidney disease with stage 1 through stage 4 chronic kidney disease, or unspecified chronic kidney disease: Secondary | ICD-10-CM | POA: Diagnosis present

## 2016-11-08 DIAGNOSIS — Z940 Kidney transplant status: Secondary | ICD-10-CM | POA: Diagnosis not present

## 2016-11-08 DIAGNOSIS — G508 Other disorders of trigeminal nerve: Secondary | ICD-10-CM | POA: Diagnosis present

## 2016-11-08 DIAGNOSIS — Z923 Personal history of irradiation: Secondary | ICD-10-CM | POA: Diagnosis not present

## 2016-11-08 DIAGNOSIS — Z85828 Personal history of other malignant neoplasm of skin: Secondary | ICD-10-CM | POA: Diagnosis not present

## 2016-11-08 DIAGNOSIS — E1122 Type 2 diabetes mellitus with diabetic chronic kidney disease: Secondary | ICD-10-CM | POA: Diagnosis present

## 2016-11-08 DIAGNOSIS — N183 Chronic kidney disease, stage 3 (moderate): Secondary | ICD-10-CM | POA: Diagnosis present

## 2016-11-08 DIAGNOSIS — R221 Localized swelling, mass and lump, neck: Secondary | ICD-10-CM | POA: Diagnosis present

## 2016-11-08 DIAGNOSIS — C07 Malignant neoplasm of parotid gland: Secondary | ICD-10-CM | POA: Diagnosis not present

## 2016-11-08 DIAGNOSIS — Z7982 Long term (current) use of aspirin: Secondary | ICD-10-CM | POA: Diagnosis not present

## 2016-11-08 DIAGNOSIS — I4891 Unspecified atrial fibrillation: Secondary | ICD-10-CM | POA: Diagnosis present

## 2016-11-08 DIAGNOSIS — Z8673 Personal history of transient ischemic attack (TIA), and cerebral infarction without residual deficits: Secondary | ICD-10-CM | POA: Diagnosis not present

## 2016-11-08 DIAGNOSIS — I252 Old myocardial infarction: Secondary | ICD-10-CM | POA: Diagnosis not present

## 2016-11-08 DIAGNOSIS — C7989 Secondary malignant neoplasm of other specified sites: Secondary | ICD-10-CM | POA: Diagnosis present

## 2016-11-08 DIAGNOSIS — Z955 Presence of coronary angioplasty implant and graft: Secondary | ICD-10-CM | POA: Diagnosis not present

## 2016-11-08 DIAGNOSIS — E039 Hypothyroidism, unspecified: Secondary | ICD-10-CM | POA: Diagnosis present

## 2016-11-08 DIAGNOSIS — C760 Malignant neoplasm of head, face and neck: Secondary | ICD-10-CM | POA: Diagnosis not present

## 2016-11-08 DIAGNOSIS — K119 Disease of salivary gland, unspecified: Secondary | ICD-10-CM | POA: Diagnosis not present

## 2016-11-08 DIAGNOSIS — K219 Gastro-esophageal reflux disease without esophagitis: Secondary | ICD-10-CM | POA: Diagnosis present

## 2016-11-08 DIAGNOSIS — I2510 Atherosclerotic heart disease of native coronary artery without angina pectoris: Secondary | ICD-10-CM | POA: Diagnosis present

## 2016-11-08 DIAGNOSIS — Z7952 Long term (current) use of systemic steroids: Secondary | ICD-10-CM | POA: Diagnosis not present

## 2016-11-08 DIAGNOSIS — Z7984 Long term (current) use of oral hypoglycemic drugs: Secondary | ICD-10-CM | POA: Diagnosis not present

## 2016-11-24 ENCOUNTER — Ambulatory Visit (INDEPENDENT_AMBULATORY_CARE_PROVIDER_SITE_OTHER): Payer: Medicare Other | Admitting: Family Medicine

## 2016-11-24 ENCOUNTER — Encounter: Payer: Self-pay | Admitting: Family Medicine

## 2016-11-24 VITALS — BP 138/88 | HR 73 | Temp 97.5°F | Resp 16 | Ht 67.0 in | Wt 146.0 lb

## 2016-11-24 DIAGNOSIS — E782 Mixed hyperlipidemia: Secondary | ICD-10-CM | POA: Diagnosis not present

## 2016-11-24 DIAGNOSIS — D490 Neoplasm of unspecified behavior of digestive system: Secondary | ICD-10-CM

## 2016-11-24 DIAGNOSIS — E1122 Type 2 diabetes mellitus with diabetic chronic kidney disease: Secondary | ICD-10-CM | POA: Diagnosis not present

## 2016-11-24 DIAGNOSIS — N184 Chronic kidney disease, stage 4 (severe): Secondary | ICD-10-CM

## 2016-11-24 NOTE — Patient Instructions (Signed)
Don't change your meds for now.  We'll check your A1c (diabetes test) when you are done with radiation.  Take care.  Glad to see you. Update me as needed.

## 2016-11-25 DIAGNOSIS — D490 Neoplasm of unspecified behavior of digestive system: Secondary | ICD-10-CM | POA: Insufficient documentation

## 2016-11-25 NOTE — Assessment & Plan Note (Signed)
No change in medications at this point. Plan on recheck later on in 3 months with A1c. He agrees.

## 2016-11-25 NOTE — Progress Notes (Signed)
Discussed with patient about recent events. LFTs have normalized on Lipitor 10 mg a day. He has cardiology follow-up pending it is likely that he is best served by continuing his dose as is, at least until he can see cardiology. Discussed with patient. He agrees.  Chronic kidney disease. He has follow-up pending at [facility] on prednisone for suppression.  Parotid tumor. Status post excision. He also needed excision in the neck. He has had postop follow-up at [facility]. Doing wet-to-dry dressings daily. He still has some pain around the collarbone but this is improving and he can put up with it. No fevers. No chills. No purulent drainage. He does have a retained staple near the left ear and some exposed suture nearby. He has evaluation pending with radiation oncology. The plan is for him to heal up from the surgery and then have local radiation.  PMH and SH reviewed  ROS: Per HPI unless specifically indicated in ROS section   Meds, vitals, and allergies reviewed.   GEN: nad, alert and oriented, R eye with baseline changes notd.  HEENT: mucous membranes moist, left facial scar healing. Retained suture nasal left ear was trimmed without complication. Removed. He had one skin staple near the left ear, easily removed without complication. Lower neck on the left side has a wound healing by secondary intent. No active bleeding. Bandage was redressed with a nonstick bandage. No complication NECK: supple w/o LA CV: rrr PULM: ctab, no inc wob ABD: soft, +bs EXT: no edema SKIN: no acute rash

## 2016-11-25 NOTE — Assessment & Plan Note (Signed)
Per [facility].

## 2016-11-25 NOTE — Assessment & Plan Note (Signed)
Liver tests improve previously. Continue statin as is. He agrees. He has follow-up with cardiology pending. >25 minutes spent in face to face time with patient, >50% spent in counselling or coordination of care.

## 2016-11-25 NOTE — Assessment & Plan Note (Signed)
Per [facility]. He will have radiation evaluation and treatment later on. Continue wet-to-dry dressings. Retained suture and staple removed today. No complication. He'll update me as needed.

## 2016-11-29 DIAGNOSIS — I252 Old myocardial infarction: Secondary | ICD-10-CM | POA: Diagnosis not present

## 2016-11-29 DIAGNOSIS — Z8673 Personal history of transient ischemic attack (TIA), and cerebral infarction without residual deficits: Secondary | ICD-10-CM | POA: Diagnosis not present

## 2016-11-29 DIAGNOSIS — Z85828 Personal history of other malignant neoplasm of skin: Secondary | ICD-10-CM | POA: Diagnosis not present

## 2016-11-29 DIAGNOSIS — I4891 Unspecified atrial fibrillation: Secondary | ICD-10-CM | POA: Diagnosis not present

## 2016-11-29 DIAGNOSIS — E119 Type 2 diabetes mellitus without complications: Secondary | ICD-10-CM | POA: Diagnosis not present

## 2016-11-29 DIAGNOSIS — I10 Essential (primary) hypertension: Secondary | ICD-10-CM | POA: Diagnosis not present

## 2016-11-29 DIAGNOSIS — C07 Malignant neoplasm of parotid gland: Secondary | ICD-10-CM | POA: Diagnosis not present

## 2016-11-29 DIAGNOSIS — E039 Hypothyroidism, unspecified: Secondary | ICD-10-CM | POA: Diagnosis not present

## 2016-11-29 DIAGNOSIS — K117 Disturbances of salivary secretion: Secondary | ICD-10-CM | POA: Diagnosis not present

## 2016-11-29 DIAGNOSIS — Z940 Kidney transplant status: Secondary | ICD-10-CM | POA: Diagnosis not present

## 2016-11-29 DIAGNOSIS — Z923 Personal history of irradiation: Secondary | ICD-10-CM | POA: Diagnosis not present

## 2016-11-29 DIAGNOSIS — C760 Malignant neoplasm of head, face and neck: Secondary | ICD-10-CM | POA: Diagnosis not present

## 2016-11-29 DIAGNOSIS — Z9089 Acquired absence of other organs: Secondary | ICD-10-CM | POA: Diagnosis not present

## 2016-11-29 DIAGNOSIS — Z9889 Other specified postprocedural states: Secondary | ICD-10-CM | POA: Diagnosis not present

## 2016-11-29 DIAGNOSIS — I2510 Atherosclerotic heart disease of native coronary artery without angina pectoris: Secondary | ICD-10-CM | POA: Diagnosis not present

## 2016-12-08 DIAGNOSIS — Z8673 Personal history of transient ischemic attack (TIA), and cerebral infarction without residual deficits: Secondary | ICD-10-CM | POA: Diagnosis not present

## 2016-12-08 DIAGNOSIS — Z7902 Long term (current) use of antithrombotics/antiplatelets: Secondary | ICD-10-CM | POA: Diagnosis not present

## 2016-12-08 DIAGNOSIS — Z7984 Long term (current) use of oral hypoglycemic drugs: Secondary | ICD-10-CM | POA: Diagnosis not present

## 2016-12-08 DIAGNOSIS — E7800 Pure hypercholesterolemia, unspecified: Secondary | ICD-10-CM | POA: Diagnosis not present

## 2016-12-08 DIAGNOSIS — Z4822 Encounter for aftercare following kidney transplant: Secondary | ICD-10-CM | POA: Diagnosis not present

## 2016-12-08 DIAGNOSIS — I480 Paroxysmal atrial fibrillation: Secondary | ICD-10-CM | POA: Diagnosis not present

## 2016-12-08 DIAGNOSIS — Z7952 Long term (current) use of systemic steroids: Secondary | ICD-10-CM | POA: Diagnosis not present

## 2016-12-08 DIAGNOSIS — Z7982 Long term (current) use of aspirin: Secondary | ICD-10-CM | POA: Diagnosis not present

## 2016-12-08 DIAGNOSIS — Z792 Long term (current) use of antibiotics: Secondary | ICD-10-CM | POA: Diagnosis not present

## 2016-12-08 DIAGNOSIS — N184 Chronic kidney disease, stage 4 (severe): Secondary | ICD-10-CM | POA: Diagnosis not present

## 2016-12-08 DIAGNOSIS — Z79899 Other long term (current) drug therapy: Secondary | ICD-10-CM | POA: Diagnosis not present

## 2016-12-08 DIAGNOSIS — I129 Hypertensive chronic kidney disease with stage 1 through stage 4 chronic kidney disease, or unspecified chronic kidney disease: Secondary | ICD-10-CM | POA: Diagnosis not present

## 2016-12-08 DIAGNOSIS — Q612 Polycystic kidney, adult type: Secondary | ICD-10-CM | POA: Diagnosis not present

## 2016-12-08 DIAGNOSIS — Z85828 Personal history of other malignant neoplasm of skin: Secondary | ICD-10-CM | POA: Diagnosis not present

## 2016-12-08 DIAGNOSIS — R918 Other nonspecific abnormal finding of lung field: Secondary | ICD-10-CM | POA: Diagnosis not present

## 2016-12-08 DIAGNOSIS — E1122 Type 2 diabetes mellitus with diabetic chronic kidney disease: Secondary | ICD-10-CM | POA: Diagnosis not present

## 2016-12-08 DIAGNOSIS — I2510 Atherosclerotic heart disease of native coronary artery without angina pectoris: Secondary | ICD-10-CM | POA: Diagnosis not present

## 2016-12-08 DIAGNOSIS — R001 Bradycardia, unspecified: Secondary | ICD-10-CM | POA: Diagnosis not present

## 2016-12-22 DIAGNOSIS — C4492 Squamous cell carcinoma of skin, unspecified: Secondary | ICD-10-CM | POA: Diagnosis not present

## 2016-12-22 DIAGNOSIS — Z85828 Personal history of other malignant neoplasm of skin: Secondary | ICD-10-CM | POA: Diagnosis not present

## 2016-12-22 DIAGNOSIS — Z9089 Acquired absence of other organs: Secondary | ICD-10-CM | POA: Diagnosis not present

## 2016-12-22 DIAGNOSIS — Z940 Kidney transplant status: Secondary | ICD-10-CM | POA: Diagnosis not present

## 2016-12-22 DIAGNOSIS — Z9889 Other specified postprocedural states: Secondary | ICD-10-CM | POA: Diagnosis not present

## 2016-12-22 DIAGNOSIS — I2510 Atherosclerotic heart disease of native coronary artery without angina pectoris: Secondary | ICD-10-CM | POA: Diagnosis not present

## 2016-12-22 DIAGNOSIS — I4891 Unspecified atrial fibrillation: Secondary | ICD-10-CM | POA: Diagnosis not present

## 2016-12-22 DIAGNOSIS — I252 Old myocardial infarction: Secondary | ICD-10-CM | POA: Diagnosis not present

## 2016-12-22 DIAGNOSIS — Z8673 Personal history of transient ischemic attack (TIA), and cerebral infarction without residual deficits: Secondary | ICD-10-CM | POA: Diagnosis not present

## 2016-12-22 DIAGNOSIS — I10 Essential (primary) hypertension: Secondary | ICD-10-CM | POA: Diagnosis not present

## 2016-12-22 DIAGNOSIS — K117 Disturbances of salivary secretion: Secondary | ICD-10-CM | POA: Diagnosis not present

## 2016-12-22 DIAGNOSIS — C44119 Basal cell carcinoma of skin of left eyelid, including canthus: Secondary | ICD-10-CM | POA: Diagnosis not present

## 2016-12-22 DIAGNOSIS — Z923 Personal history of irradiation: Secondary | ICD-10-CM | POA: Diagnosis not present

## 2016-12-22 DIAGNOSIS — C07 Malignant neoplasm of parotid gland: Secondary | ICD-10-CM | POA: Diagnosis not present

## 2016-12-22 DIAGNOSIS — C760 Malignant neoplasm of head, face and neck: Secondary | ICD-10-CM | POA: Diagnosis not present

## 2016-12-22 DIAGNOSIS — E119 Type 2 diabetes mellitus without complications: Secondary | ICD-10-CM | POA: Diagnosis not present

## 2016-12-22 DIAGNOSIS — E039 Hypothyroidism, unspecified: Secondary | ICD-10-CM | POA: Diagnosis not present

## 2016-12-23 ENCOUNTER — Ambulatory Visit: Payer: Medicare Other | Admitting: Cardiovascular Disease

## 2016-12-23 DIAGNOSIS — I2510 Atherosclerotic heart disease of native coronary artery without angina pectoris: Secondary | ICD-10-CM | POA: Diagnosis not present

## 2016-12-23 DIAGNOSIS — E039 Hypothyroidism, unspecified: Secondary | ICD-10-CM | POA: Diagnosis not present

## 2016-12-23 DIAGNOSIS — I252 Old myocardial infarction: Secondary | ICD-10-CM | POA: Diagnosis not present

## 2016-12-23 DIAGNOSIS — H0289 Other specified disorders of eyelid: Secondary | ICD-10-CM | POA: Diagnosis not present

## 2016-12-23 DIAGNOSIS — Z955 Presence of coronary angioplasty implant and graft: Secondary | ICD-10-CM | POA: Diagnosis not present

## 2016-12-23 DIAGNOSIS — C44119 Basal cell carcinoma of skin of left eyelid, including canthus: Secondary | ICD-10-CM | POA: Diagnosis not present

## 2016-12-23 DIAGNOSIS — E119 Type 2 diabetes mellitus without complications: Secondary | ICD-10-CM | POA: Diagnosis not present

## 2016-12-23 DIAGNOSIS — I4891 Unspecified atrial fibrillation: Secondary | ICD-10-CM | POA: Diagnosis not present

## 2016-12-23 DIAGNOSIS — I10 Essential (primary) hypertension: Secondary | ICD-10-CM | POA: Diagnosis not present

## 2016-12-23 DIAGNOSIS — Z9889 Other specified postprocedural states: Secondary | ICD-10-CM | POA: Diagnosis not present

## 2016-12-23 DIAGNOSIS — Z940 Kidney transplant status: Secondary | ICD-10-CM | POA: Diagnosis not present

## 2016-12-23 DIAGNOSIS — Z8673 Personal history of transient ischemic attack (TIA), and cerebral infarction without residual deficits: Secondary | ICD-10-CM | POA: Diagnosis not present

## 2016-12-23 DIAGNOSIS — Z85828 Personal history of other malignant neoplasm of skin: Secondary | ICD-10-CM | POA: Diagnosis not present

## 2016-12-27 DIAGNOSIS — M25511 Pain in right shoulder: Secondary | ICD-10-CM | POA: Diagnosis not present

## 2016-12-27 DIAGNOSIS — M25512 Pain in left shoulder: Secondary | ICD-10-CM | POA: Diagnosis not present

## 2016-12-28 DIAGNOSIS — Z8673 Personal history of transient ischemic attack (TIA), and cerebral infarction without residual deficits: Secondary | ICD-10-CM | POA: Diagnosis not present

## 2016-12-28 DIAGNOSIS — I10 Essential (primary) hypertension: Secondary | ICD-10-CM | POA: Diagnosis not present

## 2016-12-28 DIAGNOSIS — Z9889 Other specified postprocedural states: Secondary | ICD-10-CM | POA: Diagnosis not present

## 2016-12-28 DIAGNOSIS — C760 Malignant neoplasm of head, face and neck: Secondary | ICD-10-CM | POA: Diagnosis not present

## 2016-12-28 DIAGNOSIS — K117 Disturbances of salivary secretion: Secondary | ICD-10-CM | POA: Diagnosis not present

## 2016-12-28 DIAGNOSIS — E039 Hypothyroidism, unspecified: Secondary | ICD-10-CM | POA: Diagnosis not present

## 2016-12-28 DIAGNOSIS — I2510 Atherosclerotic heart disease of native coronary artery without angina pectoris: Secondary | ICD-10-CM | POA: Diagnosis not present

## 2016-12-28 DIAGNOSIS — I252 Old myocardial infarction: Secondary | ICD-10-CM | POA: Diagnosis not present

## 2016-12-28 DIAGNOSIS — Z9089 Acquired absence of other organs: Secondary | ICD-10-CM | POA: Diagnosis not present

## 2016-12-28 DIAGNOSIS — Z940 Kidney transplant status: Secondary | ICD-10-CM | POA: Diagnosis not present

## 2016-12-28 DIAGNOSIS — Z85828 Personal history of other malignant neoplasm of skin: Secondary | ICD-10-CM | POA: Diagnosis not present

## 2016-12-28 DIAGNOSIS — C4492 Squamous cell carcinoma of skin, unspecified: Secondary | ICD-10-CM | POA: Diagnosis not present

## 2016-12-28 DIAGNOSIS — C07 Malignant neoplasm of parotid gland: Secondary | ICD-10-CM | POA: Diagnosis not present

## 2016-12-28 DIAGNOSIS — E119 Type 2 diabetes mellitus without complications: Secondary | ICD-10-CM | POA: Diagnosis not present

## 2016-12-28 DIAGNOSIS — I4891 Unspecified atrial fibrillation: Secondary | ICD-10-CM | POA: Diagnosis not present

## 2016-12-28 DIAGNOSIS — Z923 Personal history of irradiation: Secondary | ICD-10-CM | POA: Diagnosis not present

## 2016-12-30 DIAGNOSIS — M25512 Pain in left shoulder: Secondary | ICD-10-CM | POA: Diagnosis not present

## 2016-12-30 DIAGNOSIS — M25511 Pain in right shoulder: Secondary | ICD-10-CM | POA: Diagnosis not present

## 2017-01-02 DIAGNOSIS — Z940 Kidney transplant status: Secondary | ICD-10-CM | POA: Diagnosis not present

## 2017-01-02 DIAGNOSIS — C4492 Squamous cell carcinoma of skin, unspecified: Secondary | ICD-10-CM | POA: Diagnosis not present

## 2017-01-02 DIAGNOSIS — Z9889 Other specified postprocedural states: Secondary | ICD-10-CM | POA: Diagnosis not present

## 2017-01-02 DIAGNOSIS — C760 Malignant neoplasm of head, face and neck: Secondary | ICD-10-CM | POA: Diagnosis not present

## 2017-01-02 DIAGNOSIS — Z85828 Personal history of other malignant neoplasm of skin: Secondary | ICD-10-CM | POA: Diagnosis not present

## 2017-01-02 DIAGNOSIS — Z923 Personal history of irradiation: Secondary | ICD-10-CM | POA: Diagnosis not present

## 2017-01-02 DIAGNOSIS — C07 Malignant neoplasm of parotid gland: Secondary | ICD-10-CM | POA: Diagnosis not present

## 2017-01-03 DIAGNOSIS — Z85828 Personal history of other malignant neoplasm of skin: Secondary | ICD-10-CM | POA: Diagnosis not present

## 2017-01-03 DIAGNOSIS — C760 Malignant neoplasm of head, face and neck: Secondary | ICD-10-CM | POA: Diagnosis not present

## 2017-01-03 DIAGNOSIS — Z923 Personal history of irradiation: Secondary | ICD-10-CM | POA: Diagnosis not present

## 2017-01-03 DIAGNOSIS — C4492 Squamous cell carcinoma of skin, unspecified: Secondary | ICD-10-CM | POA: Diagnosis not present

## 2017-01-03 DIAGNOSIS — C07 Malignant neoplasm of parotid gland: Secondary | ICD-10-CM | POA: Diagnosis not present

## 2017-01-03 DIAGNOSIS — Z940 Kidney transplant status: Secondary | ICD-10-CM | POA: Diagnosis not present

## 2017-01-03 DIAGNOSIS — Z9889 Other specified postprocedural states: Secondary | ICD-10-CM | POA: Diagnosis not present

## 2017-01-04 ENCOUNTER — Other Ambulatory Visit: Payer: Self-pay | Admitting: Cardiovascular Disease

## 2017-01-04 DIAGNOSIS — C07 Malignant neoplasm of parotid gland: Secondary | ICD-10-CM | POA: Diagnosis not present

## 2017-01-04 DIAGNOSIS — Z923 Personal history of irradiation: Secondary | ICD-10-CM | POA: Diagnosis not present

## 2017-01-04 DIAGNOSIS — Z940 Kidney transplant status: Secondary | ICD-10-CM | POA: Diagnosis not present

## 2017-01-04 DIAGNOSIS — Z9889 Other specified postprocedural states: Secondary | ICD-10-CM | POA: Diagnosis not present

## 2017-01-04 DIAGNOSIS — C760 Malignant neoplasm of head, face and neck: Secondary | ICD-10-CM | POA: Diagnosis not present

## 2017-01-04 DIAGNOSIS — C4492 Squamous cell carcinoma of skin, unspecified: Secondary | ICD-10-CM | POA: Diagnosis not present

## 2017-01-04 DIAGNOSIS — Z85828 Personal history of other malignant neoplasm of skin: Secondary | ICD-10-CM | POA: Diagnosis not present

## 2017-01-05 DIAGNOSIS — Z923 Personal history of irradiation: Secondary | ICD-10-CM | POA: Diagnosis not present

## 2017-01-05 DIAGNOSIS — C760 Malignant neoplasm of head, face and neck: Secondary | ICD-10-CM | POA: Diagnosis not present

## 2017-01-05 DIAGNOSIS — C07 Malignant neoplasm of parotid gland: Secondary | ICD-10-CM | POA: Diagnosis not present

## 2017-01-05 DIAGNOSIS — Z85828 Personal history of other malignant neoplasm of skin: Secondary | ICD-10-CM | POA: Diagnosis not present

## 2017-01-05 DIAGNOSIS — Z940 Kidney transplant status: Secondary | ICD-10-CM | POA: Diagnosis not present

## 2017-01-05 DIAGNOSIS — Z9889 Other specified postprocedural states: Secondary | ICD-10-CM | POA: Diagnosis not present

## 2017-01-05 DIAGNOSIS — C4492 Squamous cell carcinoma of skin, unspecified: Secondary | ICD-10-CM | POA: Diagnosis not present

## 2017-01-06 DIAGNOSIS — Z923 Personal history of irradiation: Secondary | ICD-10-CM | POA: Diagnosis not present

## 2017-01-06 DIAGNOSIS — M25511 Pain in right shoulder: Secondary | ICD-10-CM | POA: Diagnosis not present

## 2017-01-06 DIAGNOSIS — Z85828 Personal history of other malignant neoplasm of skin: Secondary | ICD-10-CM | POA: Diagnosis not present

## 2017-01-06 DIAGNOSIS — C760 Malignant neoplasm of head, face and neck: Secondary | ICD-10-CM | POA: Diagnosis not present

## 2017-01-06 DIAGNOSIS — C4492 Squamous cell carcinoma of skin, unspecified: Secondary | ICD-10-CM | POA: Diagnosis not present

## 2017-01-06 DIAGNOSIS — C07 Malignant neoplasm of parotid gland: Secondary | ICD-10-CM | POA: Diagnosis not present

## 2017-01-06 DIAGNOSIS — Z9889 Other specified postprocedural states: Secondary | ICD-10-CM | POA: Diagnosis not present

## 2017-01-06 DIAGNOSIS — M25512 Pain in left shoulder: Secondary | ICD-10-CM | POA: Diagnosis not present

## 2017-01-06 DIAGNOSIS — Z940 Kidney transplant status: Secondary | ICD-10-CM | POA: Diagnosis not present

## 2017-01-09 DIAGNOSIS — Z9889 Other specified postprocedural states: Secondary | ICD-10-CM | POA: Diagnosis not present

## 2017-01-09 DIAGNOSIS — C07 Malignant neoplasm of parotid gland: Secondary | ICD-10-CM | POA: Diagnosis not present

## 2017-01-09 DIAGNOSIS — C760 Malignant neoplasm of head, face and neck: Secondary | ICD-10-CM | POA: Diagnosis not present

## 2017-01-09 DIAGNOSIS — C4492 Squamous cell carcinoma of skin, unspecified: Secondary | ICD-10-CM | POA: Diagnosis not present

## 2017-01-09 DIAGNOSIS — Z85828 Personal history of other malignant neoplasm of skin: Secondary | ICD-10-CM | POA: Diagnosis not present

## 2017-01-09 DIAGNOSIS — Z940 Kidney transplant status: Secondary | ICD-10-CM | POA: Diagnosis not present

## 2017-01-09 DIAGNOSIS — Z923 Personal history of irradiation: Secondary | ICD-10-CM | POA: Diagnosis not present

## 2017-01-10 DIAGNOSIS — M25511 Pain in right shoulder: Secondary | ICD-10-CM | POA: Diagnosis not present

## 2017-01-10 DIAGNOSIS — E039 Hypothyroidism, unspecified: Secondary | ICD-10-CM | POA: Diagnosis not present

## 2017-01-10 DIAGNOSIS — Z8673 Personal history of transient ischemic attack (TIA), and cerebral infarction without residual deficits: Secondary | ICD-10-CM | POA: Diagnosis not present

## 2017-01-10 DIAGNOSIS — I4891 Unspecified atrial fibrillation: Secondary | ICD-10-CM | POA: Diagnosis not present

## 2017-01-10 DIAGNOSIS — C760 Malignant neoplasm of head, face and neck: Secondary | ICD-10-CM | POA: Diagnosis not present

## 2017-01-10 DIAGNOSIS — C4492 Squamous cell carcinoma of skin, unspecified: Secondary | ICD-10-CM | POA: Diagnosis not present

## 2017-01-10 DIAGNOSIS — Z9889 Other specified postprocedural states: Secondary | ICD-10-CM | POA: Diagnosis not present

## 2017-01-10 DIAGNOSIS — Z940 Kidney transplant status: Secondary | ICD-10-CM | POA: Diagnosis not present

## 2017-01-10 DIAGNOSIS — Z85828 Personal history of other malignant neoplasm of skin: Secondary | ICD-10-CM | POA: Diagnosis not present

## 2017-01-10 DIAGNOSIS — I2510 Atherosclerotic heart disease of native coronary artery without angina pectoris: Secondary | ICD-10-CM | POA: Diagnosis not present

## 2017-01-10 DIAGNOSIS — Z9089 Acquired absence of other organs: Secondary | ICD-10-CM | POA: Diagnosis not present

## 2017-01-10 DIAGNOSIS — I252 Old myocardial infarction: Secondary | ICD-10-CM | POA: Diagnosis not present

## 2017-01-10 DIAGNOSIS — Z923 Personal history of irradiation: Secondary | ICD-10-CM | POA: Diagnosis not present

## 2017-01-10 DIAGNOSIS — M25512 Pain in left shoulder: Secondary | ICD-10-CM | POA: Diagnosis not present

## 2017-01-10 DIAGNOSIS — E119 Type 2 diabetes mellitus without complications: Secondary | ICD-10-CM | POA: Diagnosis not present

## 2017-01-10 DIAGNOSIS — C07 Malignant neoplasm of parotid gland: Secondary | ICD-10-CM | POA: Diagnosis not present

## 2017-01-10 DIAGNOSIS — K117 Disturbances of salivary secretion: Secondary | ICD-10-CM | POA: Diagnosis not present

## 2017-01-10 DIAGNOSIS — I10 Essential (primary) hypertension: Secondary | ICD-10-CM | POA: Diagnosis not present

## 2017-01-11 DIAGNOSIS — Z9889 Other specified postprocedural states: Secondary | ICD-10-CM | POA: Diagnosis not present

## 2017-01-11 DIAGNOSIS — Z85828 Personal history of other malignant neoplasm of skin: Secondary | ICD-10-CM | POA: Diagnosis not present

## 2017-01-11 DIAGNOSIS — C07 Malignant neoplasm of parotid gland: Secondary | ICD-10-CM | POA: Diagnosis not present

## 2017-01-11 DIAGNOSIS — C4492 Squamous cell carcinoma of skin, unspecified: Secondary | ICD-10-CM | POA: Diagnosis not present

## 2017-01-11 DIAGNOSIS — Z940 Kidney transplant status: Secondary | ICD-10-CM | POA: Diagnosis not present

## 2017-01-11 DIAGNOSIS — C760 Malignant neoplasm of head, face and neck: Secondary | ICD-10-CM | POA: Diagnosis not present

## 2017-01-11 DIAGNOSIS — Z923 Personal history of irradiation: Secondary | ICD-10-CM | POA: Diagnosis not present

## 2017-01-12 DIAGNOSIS — Z923 Personal history of irradiation: Secondary | ICD-10-CM | POA: Diagnosis not present

## 2017-01-12 DIAGNOSIS — C4492 Squamous cell carcinoma of skin, unspecified: Secondary | ICD-10-CM | POA: Diagnosis not present

## 2017-01-12 DIAGNOSIS — Z9889 Other specified postprocedural states: Secondary | ICD-10-CM | POA: Diagnosis not present

## 2017-01-12 DIAGNOSIS — C07 Malignant neoplasm of parotid gland: Secondary | ICD-10-CM | POA: Diagnosis not present

## 2017-01-12 DIAGNOSIS — Z85828 Personal history of other malignant neoplasm of skin: Secondary | ICD-10-CM | POA: Diagnosis not present

## 2017-01-12 DIAGNOSIS — C760 Malignant neoplasm of head, face and neck: Secondary | ICD-10-CM | POA: Diagnosis not present

## 2017-01-12 DIAGNOSIS — Z940 Kidney transplant status: Secondary | ICD-10-CM | POA: Diagnosis not present

## 2017-01-13 DIAGNOSIS — M25512 Pain in left shoulder: Secondary | ICD-10-CM | POA: Diagnosis not present

## 2017-01-13 DIAGNOSIS — Z85828 Personal history of other malignant neoplasm of skin: Secondary | ICD-10-CM | POA: Diagnosis not present

## 2017-01-13 DIAGNOSIS — C4492 Squamous cell carcinoma of skin, unspecified: Secondary | ICD-10-CM | POA: Diagnosis not present

## 2017-01-13 DIAGNOSIS — Z923 Personal history of irradiation: Secondary | ICD-10-CM | POA: Diagnosis not present

## 2017-01-13 DIAGNOSIS — M25511 Pain in right shoulder: Secondary | ICD-10-CM | POA: Diagnosis not present

## 2017-01-13 DIAGNOSIS — Z9889 Other specified postprocedural states: Secondary | ICD-10-CM | POA: Diagnosis not present

## 2017-01-13 DIAGNOSIS — C760 Malignant neoplasm of head, face and neck: Secondary | ICD-10-CM | POA: Diagnosis not present

## 2017-01-13 DIAGNOSIS — Z940 Kidney transplant status: Secondary | ICD-10-CM | POA: Diagnosis not present

## 2017-01-13 DIAGNOSIS — C07 Malignant neoplasm of parotid gland: Secondary | ICD-10-CM | POA: Diagnosis not present

## 2017-01-16 DIAGNOSIS — Z85828 Personal history of other malignant neoplasm of skin: Secondary | ICD-10-CM | POA: Diagnosis not present

## 2017-01-16 DIAGNOSIS — Z9889 Other specified postprocedural states: Secondary | ICD-10-CM | POA: Diagnosis not present

## 2017-01-16 DIAGNOSIS — C07 Malignant neoplasm of parotid gland: Secondary | ICD-10-CM | POA: Diagnosis not present

## 2017-01-16 DIAGNOSIS — C760 Malignant neoplasm of head, face and neck: Secondary | ICD-10-CM | POA: Diagnosis not present

## 2017-01-16 DIAGNOSIS — C4492 Squamous cell carcinoma of skin, unspecified: Secondary | ICD-10-CM | POA: Diagnosis not present

## 2017-01-16 DIAGNOSIS — Z923 Personal history of irradiation: Secondary | ICD-10-CM | POA: Diagnosis not present

## 2017-01-16 DIAGNOSIS — Z940 Kidney transplant status: Secondary | ICD-10-CM | POA: Diagnosis not present

## 2017-01-17 DIAGNOSIS — M25511 Pain in right shoulder: Secondary | ICD-10-CM | POA: Diagnosis not present

## 2017-01-17 DIAGNOSIS — M25512 Pain in left shoulder: Secondary | ICD-10-CM | POA: Diagnosis not present

## 2017-01-17 DIAGNOSIS — C760 Malignant neoplasm of head, face and neck: Secondary | ICD-10-CM | POA: Diagnosis not present

## 2017-01-17 DIAGNOSIS — C07 Malignant neoplasm of parotid gland: Secondary | ICD-10-CM | POA: Diagnosis not present

## 2017-01-17 DIAGNOSIS — C4492 Squamous cell carcinoma of skin, unspecified: Secondary | ICD-10-CM | POA: Diagnosis not present

## 2017-01-17 DIAGNOSIS — Z85828 Personal history of other malignant neoplasm of skin: Secondary | ICD-10-CM | POA: Diagnosis not present

## 2017-01-17 DIAGNOSIS — Z940 Kidney transplant status: Secondary | ICD-10-CM | POA: Diagnosis not present

## 2017-01-17 DIAGNOSIS — Z923 Personal history of irradiation: Secondary | ICD-10-CM | POA: Diagnosis not present

## 2017-01-17 DIAGNOSIS — Z9889 Other specified postprocedural states: Secondary | ICD-10-CM | POA: Diagnosis not present

## 2017-01-18 DIAGNOSIS — Z85828 Personal history of other malignant neoplasm of skin: Secondary | ICD-10-CM | POA: Diagnosis not present

## 2017-01-18 DIAGNOSIS — C760 Malignant neoplasm of head, face and neck: Secondary | ICD-10-CM | POA: Diagnosis not present

## 2017-01-18 DIAGNOSIS — C07 Malignant neoplasm of parotid gland: Secondary | ICD-10-CM | POA: Diagnosis not present

## 2017-01-18 DIAGNOSIS — Z923 Personal history of irradiation: Secondary | ICD-10-CM | POA: Diagnosis not present

## 2017-01-18 DIAGNOSIS — Z9889 Other specified postprocedural states: Secondary | ICD-10-CM | POA: Diagnosis not present

## 2017-01-18 DIAGNOSIS — C4492 Squamous cell carcinoma of skin, unspecified: Secondary | ICD-10-CM | POA: Diagnosis not present

## 2017-01-18 DIAGNOSIS — Z940 Kidney transplant status: Secondary | ICD-10-CM | POA: Diagnosis not present

## 2017-01-19 DIAGNOSIS — C760 Malignant neoplasm of head, face and neck: Secondary | ICD-10-CM | POA: Diagnosis not present

## 2017-01-19 DIAGNOSIS — C07 Malignant neoplasm of parotid gland: Secondary | ICD-10-CM | POA: Diagnosis not present

## 2017-01-19 DIAGNOSIS — C4492 Squamous cell carcinoma of skin, unspecified: Secondary | ICD-10-CM | POA: Diagnosis not present

## 2017-01-19 DIAGNOSIS — Z85828 Personal history of other malignant neoplasm of skin: Secondary | ICD-10-CM | POA: Diagnosis not present

## 2017-01-19 DIAGNOSIS — Z940 Kidney transplant status: Secondary | ICD-10-CM | POA: Diagnosis not present

## 2017-01-19 DIAGNOSIS — Z9889 Other specified postprocedural states: Secondary | ICD-10-CM | POA: Diagnosis not present

## 2017-01-19 DIAGNOSIS — Z923 Personal history of irradiation: Secondary | ICD-10-CM | POA: Diagnosis not present

## 2017-01-20 ENCOUNTER — Ambulatory Visit (INDEPENDENT_AMBULATORY_CARE_PROVIDER_SITE_OTHER): Payer: Medicare Other | Admitting: Cardiovascular Disease

## 2017-01-20 ENCOUNTER — Encounter: Payer: Self-pay | Admitting: Cardiovascular Disease

## 2017-01-20 ENCOUNTER — Encounter (INDEPENDENT_AMBULATORY_CARE_PROVIDER_SITE_OTHER): Payer: Self-pay

## 2017-01-20 VITALS — BP 130/82 | HR 66 | Ht 68.0 in | Wt 146.8 lb

## 2017-01-20 DIAGNOSIS — I10 Essential (primary) hypertension: Secondary | ICD-10-CM

## 2017-01-20 DIAGNOSIS — E7800 Pure hypercholesterolemia, unspecified: Secondary | ICD-10-CM | POA: Diagnosis not present

## 2017-01-20 DIAGNOSIS — Z85828 Personal history of other malignant neoplasm of skin: Secondary | ICD-10-CM | POA: Diagnosis not present

## 2017-01-20 DIAGNOSIS — Z940 Kidney transplant status: Secondary | ICD-10-CM | POA: Diagnosis not present

## 2017-01-20 DIAGNOSIS — I480 Paroxysmal atrial fibrillation: Secondary | ICD-10-CM

## 2017-01-20 DIAGNOSIS — C4492 Squamous cell carcinoma of skin, unspecified: Secondary | ICD-10-CM | POA: Diagnosis not present

## 2017-01-20 DIAGNOSIS — I2510 Atherosclerotic heart disease of native coronary artery without angina pectoris: Secondary | ICD-10-CM

## 2017-01-20 DIAGNOSIS — Z9889 Other specified postprocedural states: Secondary | ICD-10-CM | POA: Diagnosis not present

## 2017-01-20 DIAGNOSIS — Z923 Personal history of irradiation: Secondary | ICD-10-CM | POA: Diagnosis not present

## 2017-01-20 DIAGNOSIS — C07 Malignant neoplasm of parotid gland: Secondary | ICD-10-CM | POA: Diagnosis not present

## 2017-01-20 DIAGNOSIS — C760 Malignant neoplasm of head, face and neck: Secondary | ICD-10-CM | POA: Diagnosis not present

## 2017-01-20 NOTE — Patient Instructions (Signed)
Medication Instructions:  Your physician has recommended you make the following change in your medication:  Stop aspirin  Labwork: none  Testing/Procedures: none  Follow-Up: Your physician recommends that you schedule a follow-up appointment in: 12 months.  Please call our office in about 9 months to schedule this appointment    Any Other Special Instructions Will Be Listed Below (If Applicable).     If you need a refill on your cardiac medications before your next appointment, please call your pharmacy.

## 2017-01-20 NOTE — Progress Notes (Signed)
Chief Complaint  Patient presents with  . 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 He was admitted to [facility] 12/05/12 with anterior STEMI complicated by ventricular fibrillation and cardiogenic shock. A drug eluting eluting stent was placed in the LAD. The small non-dominant RCA was occluded at beginning of case and reperfused with medical therapy during case. Moderate caliber intermediate branch with moderate stenosis. Echo 12/06/12 with preserved LV systolic function. He was admitted to [facility] November 2014 with N/V, diarrhea, confusion, gait instability after radiation therapy to right cheek. Negative hypercoagulable workup. Vasculitis labs negative. 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 TEE without left atrial thrombus. 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 Event monitor 09/27/13  showed atrial fibrillation. He was started on coumadin.  ASA was held and Brilinta was replaced with Plavix. He came in for INR check in early March in our office and fell, hit his head and had a high INR. Coumadin was stopped and ASA restarted. The decision was made not to restart coumadin given the difficulty controlling his INR. He was admitted to [facility] March 2015 with a CNS fungal infection. Admitted to [facility] June 2016 with atrial fib with RVR. He was on amiodarone, metoprolol and Cardizem but these have since been stopped in setting of bradycardia and weakness.   He is here today for follow up. The patient denies any chest pain, dyspnea, palpitations, lower extremity edema, orthopnea, PND, dizziness, near syncope or syncope. He has ongoing fatigue but no changes lately.    Primary Care Physician: Tonia Ghent, MD Nephrology: Dr.  Dayle Points at [facility] ENT: Dr. Colin Benton [facility]) Neurology: [facility]) Johna Sheriff   Past Medical History:  Diagnosis Date  . 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 Benign prostatic hypertrophy    a. Nonobstructive on 2004 cath b. anterior STEMI s/p DES-pLAD 12/05/12  . CAD (coronary artery disease)   . Cardiac arrest ([place]) 12/05/2012   In the setting of anterior STEMI  . Chronic kidney disease (CKD), stage IV (severe) ([place]) 12/08/2012   Of renal transplant, done October 19, 2010 at [facility]   . Diabetes mellitus without complication ([place])   . GERD (gastroesophageal reflux disease)   . History of shingles 08/2012   Lt eye shingles  . Hyperlipidemia   . Hypertension   . Hypothyroidism   . 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 Creat was 6 when he rec'd a deceased donor transplant 10-19-2010 at [facility].  According to patient he did OK then in the first few weeks after surgery had to go back in hospital for "BP" problems that "messed the kidney up". Since then creatinine has been in 2.3-3.0 range.  Sees only [place] nephrologists, gets most care there.  Takes prograf and Myfortic, no steroids.  PKD kidneys were left in place.    . Skin cancer   . Stroke [facility]) 2009    Past Surgical History:  Procedure Laterality Date  . CARDIAC CATHETERIZATION  2004   Nonobstructive  . CORONARY ANGIOPLASTY WITH STENT PLACEMENT  12/05/2012   30% oLAD, 20% mLAD, 95-99% pLAD s/p DES, 60% oD1, 70-80%  pRamus, 40% pLCx, 50% AV groove Cx, 50% PLB, 100% mRCA s/p recanalization intra-procedurally, noted to be small non-dominant with 70% pRCA stenosis post-procedure  . ENUCLEATION  2016   R eye  . EYE SURGERY    . FACIAL RECONSTRUCTION SURGERY  2015   [facility]  . KIDNEY TRANSPLANT     09/2010  . LEFT HEART CATHETERIZATION WITH CORONARY ANGIOGRAM N/A 12/05/2012   Procedure: LEFT HEART CATHETERIZATION WITH CORONARY ANGIOGRAM;  Surgeon: Burnell Blanks, MD;  Location: [facility] C[facility] LAB;  Service: Cardiovascular;  Laterality: N/A;  . OTHER SURGICAL HISTORY  11/21/12   cancerous cells removed from mid upper chest  . OTHER SURGICAL HISTORY N/A 11/15/2012   pt had skin cancer removed from mid upper chest    Current Outpatient Prescriptions  Medication Sig Dispense Refill  . amLODipine (NORVASC) 5 MG tablet Take 5 mg by mouth daily.    Marland Kitchen atorvastatin (LIPITOR) 10 MG tablet Take 1 tablet (10 mg total) by mouth daily.    . Cholecalciferol (VITAMIN D-1000 MAX ST) 1000 UNITS tablet Take 1,000 Units by mouth.     . clopidogrel (PLAVIX) 75 MG tablet TAKE 1 TABLET (75 MG TOTAL) BY MOUTH DAILY. 90 tablet 1  . fluconazole (DIFLUCAN) 100 MG tablet Take 1 tablet by mouth daily.  11  . furosemide (LASIX) 20 MG tablet Take 20 mg by mouth daily.    Marland Kitchen glimepiride (AMARYL) 1 MG tablet TAKE 1 TABLET (1 MG TOTAL) BY MOUTH DAILY WITH BREAKFAST. 90 tablet 1  . glucose blood (ONE TOUCH ULTRA TEST) test strip Use as instructed to test blood sugar daily. Dx code E11.9 100 each 3  . hydrALAZINE (APRESOLINE) 25 MG tablet Take 25 mg by mouth 3 (three) times daily.     Marland Kitchen levothyroxine (SYNTHROID, LEVOTHROID) 150 MCG tablet Take 1 tablet (150 mcg total) by mouth daily before breakfast. 90 tablet 2  . magnesium oxide (MAG-OX) 400 MG tablet Take 400 mg by mouth 2 (two) times daily.     . nitroGLYCERIN (NITROSTAT) 0.4 MG SL tablet Place 1 tablet (0.4 mg total) under the tongue every 5 (five) minutes x 3 doses as needed for chest pain. 25 tablet 5  . pantoprazole (PROTONIX) 40 MG tablet TAKE 1 TABLET (40 MG TOTAL) BY MOUTH DAILY. 30 tablet 8  . predniSONE (DELTASONE) 10 MG tablet Take 10 mg by mouth daily with breakfast.    . sertraline (ZOLOFT) 100 MG tablet Take 100 mg by mouth daily.     . sodium bicarbonate 650 MG tablet Take 650 mg by mouth 2 (two) times daily.     . vardenafil (LEVITRA) 10 MG tablet Take 1 tablet (10 mg total) by mouth daily as needed for erectile  dysfunction. 10 tablet prn  . vitamin B-12 (CYANOCOBALAMIN) 1000 MCG tablet Take 1 tablet by mouth daily.     No current facility-administered medications for this visit.     Allergies  Allergen Reactions  . Amiodarone Other (See Comments)    Abnormal LFTs  . Nsaids     Kidney disease  . Tolmetin     Kidney disease    Social History   Social History  . Marital status: Married    Spouse name: N/A  . Number of children: N/A  . Years of education: N/A   Occupational History  . 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 part owner in a [employer]  Topics  . Smoking status: Never Smoker  . Smokeless tobacco: Never Used  . Alcohol use No  . Drug use: No  . Sexual activity: Yes   Other Topics Concern  . Not on file   Social History Narrative   Running lawn care service, part time.     Married 1966    Family History  Problem Relation Age of Onset  . Kidney failure Mother        was on dialysis 3 times a week when she died  . Alcohol abuse Mother   . Hypertension Father   . Heart attack Sister   . Kidney disease Sister   . Aneurysm Sister   . COPD Sister   . Colon cancer Neg Hx   . Prostate cancer Neg Hx     Review of Systems:  As stated in the HPI and otherwise negative.   BP 130/82   Pulse 66   Ht '5\' 8"'$  (1.727 m)   Wt 146 lb 12.8 oz (66.6 kg)   BMI 22.32 kg/m   Physical Examination: General: Well developed, well nourished, NAD  HEENT: OP clear, mucus membranes moist  SKIN: warm, dry. No rashes. Neuro: No focal deficits  Musculoskeletal: Muscle strength 5/5 all ext  Psychiatric: Mood and affect normal  Neck: No JVD, no carotid bruits, no thyromegaly, no lymphadenopathy.  Lungs:Clear bilaterally, no wheezes, rhonci, crackles Cardiovascular: Regular rate and rhythm. No murmurs, gallops or rubs. Abdomen:Soft. Bowel sounds present. Non-tender.  Extremities: No lower extremity edema. Pulses are 2 + in the  bilateral DP/PT.   Cardiac cath 12/05/12:  Left main: 30% ostial stenosis. 20% mid stenosis.  Left Anterior Descending Artery: Large caliber vessel that courses to the apex. The proximal vessel is hazy with diffuse 95-99% stenosis extending from the ostium down to the first diagonal branch. The remainder of the LAD has no flow limiting disease. The diagonal branch is patent ostial 60% stenosis.  Ramus Intermediate: Moderate caliber vessel with 70-80% proximal stenosis.  Circumflex Artery: Large caliber, dominant vessel with 40% proximal stenosis. The first obtuse marginal branch is small in caliber with no obstructive disease. The second obtuse marginal branch is small with no disease. The distal AV groove Circumflex has a focal 50% stenosis. The left posterolateral branch has 50% stenosis.  Right Coronary Artery: Small caliber (1.75 mm) non-dominant vessel with 100% mid occlusion. (This vessel recanalized during the procedure and at the end of the procedure was noted to be small, non-dominant with a proximal 70% stenosis.   Echo 12/06/12:  Left ventricle: The cavity size was normal. Wall thickness was increased in a pattern of mild LVH. Systolic function was normal. The estimated ejection fraction was in the range of 60% to 65%. Wall motion was normal; there were no regional wall motion abnormalities. - Right ventricle: The cavity size was mildly dilated. Systolic function was mildly reduced.  EKG:  EKG is ordered today. The ekg ordered today demonstrates Sinus with PVC. Rate 66 bpm.; Non-specific T wave abn. REviewed by me.   Recent Labs: 03/19/2016: Hemoglobin 15.1 07/04/2016: Magnesium 2.1; Platelets 213 07/18/2016: TSH 2.64 10/26/2016: ALT 35; BUN 42; Creatinine, Ser 2.24; Potassium 4.1; Sodium 141   Lipid Panel    Component Value Date/Time   CHOL 183 10/26/2016 0842   TRIG 229.0 (H) 10/26/2016 0842   HDL 52.40 10/26/2016 0842   CHOLHDL 3 10/26/2016 0842   VLDL 45.8 (H) 10/26/2016  0842   LDLCALC 79 02/18/2015 0135   LDLDIRECT 104.0  10/26/2016 0842     Wt Readings from Last 3 Encounters:  01/20/17 146 lb 12.8 oz (66.6 kg)  11/24/16 146 lb (66.2 kg)  07/25/16 147 lb (66.7 kg)     Other studies Reviewed: Additional studies/ records that were reviewed today include: . Review of the above records demonstrates:   Assessment and Plan:   1. CAD without angina: No recent chest pains suggestive of angina. He had an anterior STEMI in 2014 and a drug eluting stent was placed in the LAD. Moderate intermediate branch disease. Echo 2014 with preserved LV systolic function. Will continue Plavix, statin. Will stop ASA due to easy bruising.   2. Chronic kidney disease, stage III s/p kidney transplant: Followed in Nephrology  3. HTN: BP controlled. No changes  4. Paroxysmal, atrial fibrillation: Sinus today. He is off of coumadin due to inability to control his INR. He is on ASA and Plavix. He is aware of stroke risk off of anti-coagulation. Given multiple complex issues, will not start anti-coagulation.    5. Hyperlipidemia: He is on a statin. LDL near goal.   Current medicines are reviewed at length with the patient today.  The patient does not have concerns regarding medicines.  The following changes have been made:  no change  Labs/ tests ordered today include:   Orders Placed This Encounter  Procedures  . EKG 12-Lead    Disposition:   FU with me in 12 months   Signed, Lauree Chandler, MD 01/20/2017 3:23 PM    [facility] [address], [place], [place]  [address] Phone: [phone]; Fax: [phone]

## 2017-01-23 DIAGNOSIS — Z923 Personal history of irradiation: Secondary | ICD-10-CM | POA: Diagnosis not present

## 2017-01-23 DIAGNOSIS — C07 Malignant neoplasm of parotid gland: Secondary | ICD-10-CM | POA: Diagnosis not present

## 2017-01-23 DIAGNOSIS — Z9889 Other specified postprocedural states: Secondary | ICD-10-CM | POA: Diagnosis not present

## 2017-01-23 DIAGNOSIS — Z85828 Personal history of other malignant neoplasm of skin: Secondary | ICD-10-CM | POA: Diagnosis not present

## 2017-01-23 DIAGNOSIS — C760 Malignant neoplasm of head, face and neck: Secondary | ICD-10-CM | POA: Diagnosis not present

## 2017-01-23 DIAGNOSIS — C4492 Squamous cell carcinoma of skin, unspecified: Secondary | ICD-10-CM | POA: Diagnosis not present

## 2017-01-23 DIAGNOSIS — Z940 Kidney transplant status: Secondary | ICD-10-CM | POA: Diagnosis not present

## 2017-01-24 DIAGNOSIS — Z9889 Other specified postprocedural states: Secondary | ICD-10-CM | POA: Diagnosis not present

## 2017-01-24 DIAGNOSIS — M25512 Pain in left shoulder: Secondary | ICD-10-CM | POA: Diagnosis not present

## 2017-01-24 DIAGNOSIS — M25511 Pain in right shoulder: Secondary | ICD-10-CM | POA: Diagnosis not present

## 2017-01-24 DIAGNOSIS — C4492 Squamous cell carcinoma of skin, unspecified: Secondary | ICD-10-CM | POA: Diagnosis not present

## 2017-01-24 DIAGNOSIS — Z85828 Personal history of other malignant neoplasm of skin: Secondary | ICD-10-CM | POA: Diagnosis not present

## 2017-01-24 DIAGNOSIS — Z940 Kidney transplant status: Secondary | ICD-10-CM | POA: Diagnosis not present

## 2017-01-24 DIAGNOSIS — Z923 Personal history of irradiation: Secondary | ICD-10-CM | POA: Diagnosis not present

## 2017-01-24 DIAGNOSIS — C07 Malignant neoplasm of parotid gland: Secondary | ICD-10-CM | POA: Diagnosis not present

## 2017-01-24 DIAGNOSIS — C760 Malignant neoplasm of head, face and neck: Secondary | ICD-10-CM | POA: Diagnosis not present

## 2017-01-25 DIAGNOSIS — Z85828 Personal history of other malignant neoplasm of skin: Secondary | ICD-10-CM | POA: Diagnosis not present

## 2017-01-25 DIAGNOSIS — Z923 Personal history of irradiation: Secondary | ICD-10-CM | POA: Diagnosis not present

## 2017-01-25 DIAGNOSIS — Z940 Kidney transplant status: Secondary | ICD-10-CM | POA: Diagnosis not present

## 2017-01-25 DIAGNOSIS — C760 Malignant neoplasm of head, face and neck: Secondary | ICD-10-CM | POA: Diagnosis not present

## 2017-01-25 DIAGNOSIS — Z9889 Other specified postprocedural states: Secondary | ICD-10-CM | POA: Diagnosis not present

## 2017-01-25 DIAGNOSIS — C07 Malignant neoplasm of parotid gland: Secondary | ICD-10-CM | POA: Diagnosis not present

## 2017-01-26 DIAGNOSIS — Z940 Kidney transplant status: Secondary | ICD-10-CM | POA: Diagnosis not present

## 2017-01-26 DIAGNOSIS — Z9889 Other specified postprocedural states: Secondary | ICD-10-CM | POA: Diagnosis not present

## 2017-01-26 DIAGNOSIS — C4492 Squamous cell carcinoma of skin, unspecified: Secondary | ICD-10-CM | POA: Diagnosis not present

## 2017-01-26 DIAGNOSIS — C07 Malignant neoplasm of parotid gland: Secondary | ICD-10-CM | POA: Diagnosis not present

## 2017-01-26 DIAGNOSIS — Z85828 Personal history of other malignant neoplasm of skin: Secondary | ICD-10-CM | POA: Diagnosis not present

## 2017-01-26 DIAGNOSIS — C760 Malignant neoplasm of head, face and neck: Secondary | ICD-10-CM | POA: Diagnosis not present

## 2017-01-26 DIAGNOSIS — Z923 Personal history of irradiation: Secondary | ICD-10-CM | POA: Diagnosis not present

## 2017-01-27 DIAGNOSIS — C4492 Squamous cell carcinoma of skin, unspecified: Secondary | ICD-10-CM | POA: Diagnosis not present

## 2017-01-27 DIAGNOSIS — M25511 Pain in right shoulder: Secondary | ICD-10-CM | POA: Diagnosis not present

## 2017-01-27 DIAGNOSIS — Z9889 Other specified postprocedural states: Secondary | ICD-10-CM | POA: Diagnosis not present

## 2017-01-27 DIAGNOSIS — Z85828 Personal history of other malignant neoplasm of skin: Secondary | ICD-10-CM | POA: Diagnosis not present

## 2017-01-27 DIAGNOSIS — Z940 Kidney transplant status: Secondary | ICD-10-CM | POA: Diagnosis not present

## 2017-01-27 DIAGNOSIS — M25512 Pain in left shoulder: Secondary | ICD-10-CM | POA: Diagnosis not present

## 2017-01-27 DIAGNOSIS — C760 Malignant neoplasm of head, face and neck: Secondary | ICD-10-CM | POA: Diagnosis not present

## 2017-01-27 DIAGNOSIS — C07 Malignant neoplasm of parotid gland: Secondary | ICD-10-CM | POA: Diagnosis not present

## 2017-01-27 DIAGNOSIS — Z923 Personal history of irradiation: Secondary | ICD-10-CM | POA: Diagnosis not present

## 2017-01-30 DIAGNOSIS — C4492 Squamous cell carcinoma of skin, unspecified: Secondary | ICD-10-CM | POA: Diagnosis not present

## 2017-01-30 DIAGNOSIS — C760 Malignant neoplasm of head, face and neck: Secondary | ICD-10-CM | POA: Diagnosis not present

## 2017-01-30 DIAGNOSIS — C07 Malignant neoplasm of parotid gland: Secondary | ICD-10-CM | POA: Diagnosis not present

## 2017-01-30 DIAGNOSIS — Z940 Kidney transplant status: Secondary | ICD-10-CM | POA: Diagnosis not present

## 2017-01-30 DIAGNOSIS — Z923 Personal history of irradiation: Secondary | ICD-10-CM | POA: Diagnosis not present

## 2017-01-30 DIAGNOSIS — Z85828 Personal history of other malignant neoplasm of skin: Secondary | ICD-10-CM | POA: Diagnosis not present

## 2017-01-30 DIAGNOSIS — Z9889 Other specified postprocedural states: Secondary | ICD-10-CM | POA: Diagnosis not present

## 2017-01-31 DIAGNOSIS — M25511 Pain in right shoulder: Secondary | ICD-10-CM | POA: Diagnosis not present

## 2017-01-31 DIAGNOSIS — C760 Malignant neoplasm of head, face and neck: Secondary | ICD-10-CM | POA: Diagnosis not present

## 2017-01-31 DIAGNOSIS — Z9889 Other specified postprocedural states: Secondary | ICD-10-CM | POA: Diagnosis not present

## 2017-01-31 DIAGNOSIS — Z940 Kidney transplant status: Secondary | ICD-10-CM | POA: Diagnosis not present

## 2017-01-31 DIAGNOSIS — Z85828 Personal history of other malignant neoplasm of skin: Secondary | ICD-10-CM | POA: Diagnosis not present

## 2017-01-31 DIAGNOSIS — C07 Malignant neoplasm of parotid gland: Secondary | ICD-10-CM | POA: Diagnosis not present

## 2017-01-31 DIAGNOSIS — Z923 Personal history of irradiation: Secondary | ICD-10-CM | POA: Diagnosis not present

## 2017-01-31 DIAGNOSIS — M25512 Pain in left shoulder: Secondary | ICD-10-CM | POA: Diagnosis not present

## 2017-02-01 DIAGNOSIS — Z85828 Personal history of other malignant neoplasm of skin: Secondary | ICD-10-CM | POA: Diagnosis not present

## 2017-02-01 DIAGNOSIS — Z923 Personal history of irradiation: Secondary | ICD-10-CM | POA: Diagnosis not present

## 2017-02-01 DIAGNOSIS — C760 Malignant neoplasm of head, face and neck: Secondary | ICD-10-CM | POA: Diagnosis not present

## 2017-02-01 DIAGNOSIS — C07 Malignant neoplasm of parotid gland: Secondary | ICD-10-CM | POA: Diagnosis not present

## 2017-02-01 DIAGNOSIS — Z940 Kidney transplant status: Secondary | ICD-10-CM | POA: Diagnosis not present

## 2017-02-01 DIAGNOSIS — C4492 Squamous cell carcinoma of skin, unspecified: Secondary | ICD-10-CM | POA: Diagnosis not present

## 2017-02-01 DIAGNOSIS — Z9889 Other specified postprocedural states: Secondary | ICD-10-CM | POA: Diagnosis not present

## 2017-02-02 DIAGNOSIS — Z85828 Personal history of other malignant neoplasm of skin: Secondary | ICD-10-CM | POA: Diagnosis not present

## 2017-02-02 DIAGNOSIS — C07 Malignant neoplasm of parotid gland: Secondary | ICD-10-CM | POA: Diagnosis not present

## 2017-02-02 DIAGNOSIS — Z940 Kidney transplant status: Secondary | ICD-10-CM | POA: Diagnosis not present

## 2017-02-02 DIAGNOSIS — C4492 Squamous cell carcinoma of skin, unspecified: Secondary | ICD-10-CM | POA: Diagnosis not present

## 2017-02-02 DIAGNOSIS — Z9889 Other specified postprocedural states: Secondary | ICD-10-CM | POA: Diagnosis not present

## 2017-02-02 DIAGNOSIS — Z923 Personal history of irradiation: Secondary | ICD-10-CM | POA: Diagnosis not present

## 2017-02-02 DIAGNOSIS — C760 Malignant neoplasm of head, face and neck: Secondary | ICD-10-CM | POA: Diagnosis not present

## 2017-02-03 DIAGNOSIS — Z9889 Other specified postprocedural states: Secondary | ICD-10-CM | POA: Diagnosis not present

## 2017-02-03 DIAGNOSIS — Z940 Kidney transplant status: Secondary | ICD-10-CM | POA: Diagnosis not present

## 2017-02-03 DIAGNOSIS — C760 Malignant neoplasm of head, face and neck: Secondary | ICD-10-CM | POA: Diagnosis not present

## 2017-02-03 DIAGNOSIS — C4492 Squamous cell carcinoma of skin, unspecified: Secondary | ICD-10-CM | POA: Diagnosis not present

## 2017-02-03 DIAGNOSIS — Z923 Personal history of irradiation: Secondary | ICD-10-CM | POA: Diagnosis not present

## 2017-02-03 DIAGNOSIS — Z85828 Personal history of other malignant neoplasm of skin: Secondary | ICD-10-CM | POA: Diagnosis not present

## 2017-02-03 DIAGNOSIS — C07 Malignant neoplasm of parotid gland: Secondary | ICD-10-CM | POA: Diagnosis not present

## 2017-02-07 DIAGNOSIS — C07 Malignant neoplasm of parotid gland: Secondary | ICD-10-CM | POA: Diagnosis not present

## 2017-02-07 DIAGNOSIS — Z923 Personal history of irradiation: Secondary | ICD-10-CM | POA: Diagnosis not present

## 2017-02-07 DIAGNOSIS — Z9889 Other specified postprocedural states: Secondary | ICD-10-CM | POA: Diagnosis not present

## 2017-02-07 DIAGNOSIS — Z940 Kidney transplant status: Secondary | ICD-10-CM | POA: Diagnosis not present

## 2017-02-07 DIAGNOSIS — Z85828 Personal history of other malignant neoplasm of skin: Secondary | ICD-10-CM | POA: Diagnosis not present

## 2017-02-07 DIAGNOSIS — C760 Malignant neoplasm of head, face and neck: Secondary | ICD-10-CM | POA: Diagnosis not present

## 2017-02-08 DIAGNOSIS — Z9889 Other specified postprocedural states: Secondary | ICD-10-CM | POA: Diagnosis not present

## 2017-02-08 DIAGNOSIS — C760 Malignant neoplasm of head, face and neck: Secondary | ICD-10-CM | POA: Diagnosis not present

## 2017-02-08 DIAGNOSIS — Z923 Personal history of irradiation: Secondary | ICD-10-CM | POA: Diagnosis not present

## 2017-02-08 DIAGNOSIS — C07 Malignant neoplasm of parotid gland: Secondary | ICD-10-CM | POA: Diagnosis not present

## 2017-02-08 DIAGNOSIS — Z940 Kidney transplant status: Secondary | ICD-10-CM | POA: Diagnosis not present

## 2017-02-08 DIAGNOSIS — Z85828 Personal history of other malignant neoplasm of skin: Secondary | ICD-10-CM | POA: Diagnosis not present

## 2017-02-09 DIAGNOSIS — C4492 Squamous cell carcinoma of skin, unspecified: Secondary | ICD-10-CM | POA: Diagnosis not present

## 2017-02-09 DIAGNOSIS — C07 Malignant neoplasm of parotid gland: Secondary | ICD-10-CM | POA: Diagnosis not present

## 2017-02-09 DIAGNOSIS — Z940 Kidney transplant status: Secondary | ICD-10-CM | POA: Diagnosis not present

## 2017-02-09 DIAGNOSIS — C760 Malignant neoplasm of head, face and neck: Secondary | ICD-10-CM | POA: Diagnosis not present

## 2017-02-09 DIAGNOSIS — Z85828 Personal history of other malignant neoplasm of skin: Secondary | ICD-10-CM | POA: Diagnosis not present

## 2017-02-09 DIAGNOSIS — Z9889 Other specified postprocedural states: Secondary | ICD-10-CM | POA: Diagnosis not present

## 2017-02-09 DIAGNOSIS — Z923 Personal history of irradiation: Secondary | ICD-10-CM | POA: Diagnosis not present

## 2017-02-10 DIAGNOSIS — M25511 Pain in right shoulder: Secondary | ICD-10-CM | POA: Diagnosis not present

## 2017-02-10 DIAGNOSIS — M25512 Pain in left shoulder: Secondary | ICD-10-CM | POA: Diagnosis not present

## 2017-02-10 DIAGNOSIS — C4492 Squamous cell carcinoma of skin, unspecified: Secondary | ICD-10-CM | POA: Diagnosis not present

## 2017-02-10 DIAGNOSIS — Z510 Encounter for antineoplastic radiation therapy: Secondary | ICD-10-CM | POA: Diagnosis not present

## 2017-02-15 DIAGNOSIS — Z9889 Other specified postprocedural states: Secondary | ICD-10-CM | POA: Diagnosis not present

## 2017-02-15 DIAGNOSIS — C07 Malignant neoplasm of parotid gland: Secondary | ICD-10-CM | POA: Diagnosis not present

## 2017-02-15 DIAGNOSIS — K117 Disturbances of salivary secretion: Secondary | ICD-10-CM | POA: Diagnosis not present

## 2017-02-15 DIAGNOSIS — E119 Type 2 diabetes mellitus without complications: Secondary | ICD-10-CM | POA: Diagnosis not present

## 2017-02-15 DIAGNOSIS — Z9089 Acquired absence of other organs: Secondary | ICD-10-CM | POA: Diagnosis not present

## 2017-02-15 DIAGNOSIS — I10 Essential (primary) hypertension: Secondary | ICD-10-CM | POA: Diagnosis not present

## 2017-02-15 DIAGNOSIS — Z940 Kidney transplant status: Secondary | ICD-10-CM | POA: Diagnosis not present

## 2017-02-15 DIAGNOSIS — Z923 Personal history of irradiation: Secondary | ICD-10-CM | POA: Diagnosis not present

## 2017-02-15 DIAGNOSIS — Z8673 Personal history of transient ischemic attack (TIA), and cerebral infarction without residual deficits: Secondary | ICD-10-CM | POA: Diagnosis not present

## 2017-02-15 DIAGNOSIS — I4891 Unspecified atrial fibrillation: Secondary | ICD-10-CM | POA: Diagnosis not present

## 2017-02-15 DIAGNOSIS — I252 Old myocardial infarction: Secondary | ICD-10-CM | POA: Diagnosis not present

## 2017-02-15 DIAGNOSIS — Z85828 Personal history of other malignant neoplasm of skin: Secondary | ICD-10-CM | POA: Diagnosis not present

## 2017-02-15 DIAGNOSIS — C760 Malignant neoplasm of head, face and neck: Secondary | ICD-10-CM | POA: Diagnosis not present

## 2017-02-15 DIAGNOSIS — I2510 Atherosclerotic heart disease of native coronary artery without angina pectoris: Secondary | ICD-10-CM | POA: Diagnosis not present

## 2017-02-15 DIAGNOSIS — E039 Hypothyroidism, unspecified: Secondary | ICD-10-CM | POA: Diagnosis not present

## 2017-02-28 DIAGNOSIS — Z8639 Personal history of other endocrine, nutritional and metabolic disease: Secondary | ICD-10-CM | POA: Diagnosis not present

## 2017-02-28 DIAGNOSIS — Z8673 Personal history of transient ischemic attack (TIA), and cerebral infarction without residual deficits: Secondary | ICD-10-CM | POA: Diagnosis not present

## 2017-02-28 DIAGNOSIS — Z7902 Long term (current) use of antithrombotics/antiplatelets: Secondary | ICD-10-CM | POA: Diagnosis not present

## 2017-02-28 DIAGNOSIS — N184 Chronic kidney disease, stage 4 (severe): Secondary | ICD-10-CM | POA: Diagnosis not present

## 2017-02-28 DIAGNOSIS — E872 Acidosis: Secondary | ICD-10-CM | POA: Diagnosis not present

## 2017-02-28 DIAGNOSIS — C44320 Squamous cell carcinoma of skin of unspecified parts of face: Secondary | ICD-10-CM | POA: Diagnosis not present

## 2017-02-28 DIAGNOSIS — I272 Pulmonary hypertension, unspecified: Secondary | ICD-10-CM | POA: Diagnosis not present

## 2017-02-28 DIAGNOSIS — Z7984 Long term (current) use of oral hypoglycemic drugs: Secondary | ICD-10-CM | POA: Diagnosis not present

## 2017-02-28 DIAGNOSIS — R740 Nonspecific elevation of levels of transaminase and lactic acid dehydrogenase [LDH]: Secondary | ICD-10-CM | POA: Diagnosis not present

## 2017-02-28 DIAGNOSIS — Z79899 Other long term (current) drug therapy: Secondary | ICD-10-CM | POA: Diagnosis not present

## 2017-02-28 DIAGNOSIS — Z9889 Other specified postprocedural states: Secondary | ICD-10-CM | POA: Diagnosis not present

## 2017-02-28 DIAGNOSIS — E038 Other specified hypothyroidism: Secondary | ICD-10-CM | POA: Diagnosis not present

## 2017-02-28 DIAGNOSIS — I2510 Atherosclerotic heart disease of native coronary artery without angina pectoris: Secondary | ICD-10-CM | POA: Diagnosis not present

## 2017-02-28 DIAGNOSIS — T8612 Kidney transplant failure: Secondary | ICD-10-CM | POA: Diagnosis not present

## 2017-02-28 DIAGNOSIS — R001 Bradycardia, unspecified: Secondary | ICD-10-CM | POA: Diagnosis not present

## 2017-02-28 DIAGNOSIS — C7989 Secondary malignant neoplasm of other specified sites: Secondary | ICD-10-CM | POA: Diagnosis not present

## 2017-02-28 DIAGNOSIS — Z8619 Personal history of other infectious and parasitic diseases: Secondary | ICD-10-CM | POA: Diagnosis not present

## 2017-02-28 DIAGNOSIS — I252 Old myocardial infarction: Secondary | ICD-10-CM | POA: Diagnosis not present

## 2017-02-28 DIAGNOSIS — E1165 Type 2 diabetes mellitus with hyperglycemia: Secondary | ICD-10-CM | POA: Diagnosis not present

## 2017-02-28 DIAGNOSIS — I129 Hypertensive chronic kidney disease with stage 1 through stage 4 chronic kidney disease, or unspecified chronic kidney disease: Secondary | ICD-10-CM | POA: Diagnosis not present

## 2017-02-28 DIAGNOSIS — E7800 Pure hypercholesterolemia, unspecified: Secondary | ICD-10-CM | POA: Diagnosis not present

## 2017-02-28 DIAGNOSIS — E1122 Type 2 diabetes mellitus with diabetic chronic kidney disease: Secondary | ICD-10-CM | POA: Diagnosis not present

## 2017-02-28 DIAGNOSIS — Q612 Polycystic kidney, adult type: Secondary | ICD-10-CM | POA: Diagnosis not present

## 2017-02-28 DIAGNOSIS — D490 Neoplasm of unspecified behavior of digestive system: Secondary | ICD-10-CM | POA: Diagnosis not present

## 2017-02-28 DIAGNOSIS — I071 Rheumatic tricuspid insufficiency: Secondary | ICD-10-CM | POA: Diagnosis not present

## 2017-02-28 DIAGNOSIS — I480 Paroxysmal atrial fibrillation: Secondary | ICD-10-CM | POA: Diagnosis not present

## 2017-02-28 DIAGNOSIS — Z4822 Encounter for aftercare following kidney transplant: Secondary | ICD-10-CM | POA: Diagnosis not present

## 2017-03-01 DIAGNOSIS — Z923 Personal history of irradiation: Secondary | ICD-10-CM | POA: Diagnosis not present

## 2017-03-01 DIAGNOSIS — Z940 Kidney transplant status: Secondary | ICD-10-CM | POA: Diagnosis not present

## 2017-03-01 DIAGNOSIS — C44119 Basal cell carcinoma of skin of left eyelid, including canthus: Secondary | ICD-10-CM | POA: Diagnosis not present

## 2017-03-01 DIAGNOSIS — Z85828 Personal history of other malignant neoplasm of skin: Secondary | ICD-10-CM | POA: Diagnosis not present

## 2017-03-01 DIAGNOSIS — Z9889 Other specified postprocedural states: Secondary | ICD-10-CM | POA: Diagnosis not present

## 2017-03-01 DIAGNOSIS — C07 Malignant neoplasm of parotid gland: Secondary | ICD-10-CM | POA: Diagnosis not present

## 2017-03-01 DIAGNOSIS — C760 Malignant neoplasm of head, face and neck: Secondary | ICD-10-CM | POA: Diagnosis not present

## 2017-03-08 DIAGNOSIS — C760 Malignant neoplasm of head, face and neck: Secondary | ICD-10-CM | POA: Diagnosis not present

## 2017-03-08 DIAGNOSIS — Z9889 Other specified postprocedural states: Secondary | ICD-10-CM | POA: Diagnosis not present

## 2017-03-08 DIAGNOSIS — C07 Malignant neoplasm of parotid gland: Secondary | ICD-10-CM | POA: Diagnosis not present

## 2017-03-08 DIAGNOSIS — Z923 Personal history of irradiation: Secondary | ICD-10-CM | POA: Diagnosis not present

## 2017-03-08 DIAGNOSIS — Z940 Kidney transplant status: Secondary | ICD-10-CM | POA: Diagnosis not present

## 2017-03-08 DIAGNOSIS — Z85828 Personal history of other malignant neoplasm of skin: Secondary | ICD-10-CM | POA: Diagnosis not present

## 2017-03-09 ENCOUNTER — Encounter: Payer: Self-pay | Admitting: Family Medicine

## 2017-03-09 LAB — POTASSIUM
A1c: 6.6
ALT: 52
AST: 40
BUN: 34 — AB (ref 4–21)
CREATININE: 2.47
GLUCOSE: 172
Hemoglobin: 13.5
PTH: 61
Phosphorus: 2.7
Potassium: 4.2
TSH: 1.022
VITAMIN B12: >1500

## 2017-03-18 ENCOUNTER — Emergency Department: Payer: Medicare Other

## 2017-03-18 ENCOUNTER — Inpatient Hospital Stay
Admission: EM | Admit: 2017-03-18 | Discharge: 2017-04-12 | DRG: 871 | Disposition: E | Payer: Medicare Other | Attending: Internal Medicine | Admitting: Internal Medicine

## 2017-03-18 ENCOUNTER — Encounter: Payer: Self-pay | Admitting: *Deleted

## 2017-03-18 ENCOUNTER — Inpatient Hospital Stay: Payer: Medicare Other

## 2017-03-18 DIAGNOSIS — E875 Hyperkalemia: Secondary | ICD-10-CM | POA: Diagnosis present

## 2017-03-18 DIAGNOSIS — N189 Chronic kidney disease, unspecified: Secondary | ICD-10-CM

## 2017-03-18 DIAGNOSIS — Z8249 Family history of ischemic heart disease and other diseases of the circulatory system: Secondary | ICD-10-CM

## 2017-03-18 DIAGNOSIS — Z66 Do not resuscitate: Secondary | ICD-10-CM | POA: Diagnosis present

## 2017-03-18 DIAGNOSIS — N179 Acute kidney failure, unspecified: Secondary | ICD-10-CM | POA: Diagnosis present

## 2017-03-18 DIAGNOSIS — A419 Sepsis, unspecified organism: Secondary | ICD-10-CM | POA: Diagnosis present

## 2017-03-18 DIAGNOSIS — Z955 Presence of coronary angioplasty implant and graft: Secondary | ICD-10-CM | POA: Diagnosis not present

## 2017-03-18 DIAGNOSIS — N184 Chronic kidney disease, stage 4 (severe): Secondary | ICD-10-CM | POA: Diagnosis present

## 2017-03-18 DIAGNOSIS — Z841 Family history of disorders of kidney and ureter: Secondary | ICD-10-CM | POA: Diagnosis not present

## 2017-03-18 DIAGNOSIS — Z7902 Long term (current) use of antithrombotics/antiplatelets: Secondary | ICD-10-CM | POA: Diagnosis not present

## 2017-03-18 DIAGNOSIS — R0603 Acute respiratory distress: Secondary | ICD-10-CM

## 2017-03-18 DIAGNOSIS — Z811 Family history of alcohol abuse and dependence: Secondary | ICD-10-CM | POA: Diagnosis not present

## 2017-03-18 DIAGNOSIS — J189 Pneumonia, unspecified organism: Secondary | ICD-10-CM | POA: Diagnosis present

## 2017-03-18 DIAGNOSIS — Z85828 Personal history of other malignant neoplasm of skin: Secondary | ICD-10-CM | POA: Diagnosis not present

## 2017-03-18 DIAGNOSIS — Z7952 Long term (current) use of systemic steroids: Secondary | ICD-10-CM | POA: Diagnosis not present

## 2017-03-18 DIAGNOSIS — N400 Enlarged prostate without lower urinary tract symptoms: Secondary | ICD-10-CM | POA: Diagnosis present

## 2017-03-18 DIAGNOSIS — E872 Acidosis, unspecified: Secondary | ICD-10-CM

## 2017-03-18 DIAGNOSIS — R0600 Dyspnea, unspecified: Secondary | ICD-10-CM | POA: Diagnosis not present

## 2017-03-18 DIAGNOSIS — I129 Hypertensive chronic kidney disease with stage 1 through stage 4 chronic kidney disease, or unspecified chronic kidney disease: Secondary | ICD-10-CM | POA: Diagnosis present

## 2017-03-18 DIAGNOSIS — K219 Gastro-esophageal reflux disease without esophagitis: Secondary | ICD-10-CM | POA: Diagnosis present

## 2017-03-18 DIAGNOSIS — E782 Mixed hyperlipidemia: Secondary | ICD-10-CM | POA: Diagnosis present

## 2017-03-18 DIAGNOSIS — R6521 Severe sepsis with septic shock: Secondary | ICD-10-CM | POA: Diagnosis present

## 2017-03-18 DIAGNOSIS — I252 Old myocardial infarction: Secondary | ICD-10-CM

## 2017-03-18 DIAGNOSIS — E1122 Type 2 diabetes mellitus with diabetic chronic kidney disease: Secondary | ICD-10-CM | POA: Diagnosis present

## 2017-03-18 DIAGNOSIS — Z888 Allergy status to other drugs, medicaments and biological substances status: Secondary | ICD-10-CM

## 2017-03-18 DIAGNOSIS — E039 Hypothyroidism, unspecified: Secondary | ICD-10-CM | POA: Diagnosis present

## 2017-03-18 DIAGNOSIS — I2510 Atherosclerotic heart disease of native coronary artery without angina pectoris: Secondary | ICD-10-CM | POA: Diagnosis present

## 2017-03-18 DIAGNOSIS — R404 Transient alteration of awareness: Secondary | ICD-10-CM | POA: Diagnosis not present

## 2017-03-18 DIAGNOSIS — Z886 Allergy status to analgesic agent status: Secondary | ICD-10-CM

## 2017-03-18 DIAGNOSIS — Z940 Kidney transplant status: Secondary | ICD-10-CM | POA: Diagnosis not present

## 2017-03-18 DIAGNOSIS — Z8674 Personal history of sudden cardiac arrest: Secondary | ICD-10-CM

## 2017-03-18 DIAGNOSIS — R6520 Severe sepsis without septic shock: Secondary | ICD-10-CM

## 2017-03-18 DIAGNOSIS — I469 Cardiac arrest, cause unspecified: Secondary | ICD-10-CM | POA: Diagnosis not present

## 2017-03-18 DIAGNOSIS — K7200 Acute and subacute hepatic failure without coma: Secondary | ICD-10-CM | POA: Diagnosis present

## 2017-03-18 DIAGNOSIS — Z8673 Personal history of transient ischemic attack (TIA), and cerebral infarction without residual deficits: Secondary | ICD-10-CM

## 2017-03-18 DIAGNOSIS — Z825 Family history of asthma and other chronic lower respiratory diseases: Secondary | ICD-10-CM

## 2017-03-18 DIAGNOSIS — Z7984 Long term (current) use of oral hypoglycemic drugs: Secondary | ICD-10-CM

## 2017-03-18 DIAGNOSIS — R0602 Shortness of breath: Secondary | ICD-10-CM | POA: Diagnosis not present

## 2017-03-18 LAB — LACTIC ACID, PLASMA: LACTIC ACID, VENOUS: 14.1 mmol/L — AB (ref 0.5–1.9)

## 2017-03-18 LAB — COMPREHENSIVE METABOLIC PANEL
ALK PHOS: 107 U/L (ref 38–126)
ALT: 1348 U/L — AB (ref 17–63)
AST: 1604 U/L — AB (ref 15–41)
Albumin: 3.2 g/dL — ABNORMAL LOW (ref 3.5–5.0)
BUN: 52 mg/dL — ABNORMAL HIGH (ref 6–20)
CALCIUM: 9.2 mg/dL (ref 8.9–10.3)
CO2: <7 mmol/L — ABNORMAL LOW (ref 22–32)
CREATININE: 3.49 mg/dL — AB (ref 0.61–1.24)
Chloride: 106 mmol/L (ref 101–111)
GFR calc non Af Amer: 16 mL/min — ABNORMAL LOW (ref >60)
GFR, EST AFRICAN AMERICAN: 19 mL/min — AB (ref >60)
Glucose, Bld: 147 mg/dL — ABNORMAL HIGH (ref 65–99)
Potassium: 6 mmol/L — ABNORMAL HIGH (ref 3.5–5.1)
Sodium: 139 mmol/L (ref 135–145)
Total Bilirubin: 2.6 mg/dL — ABNORMAL HIGH (ref 0.3–1.2)
Total Protein: 6.9 g/dL (ref 6.5–8.1)

## 2017-03-18 LAB — CBC WITH DIFFERENTIAL/PLATELET
BASOS ABS: 0 10*3/uL (ref 0–0.1)
Basophils Relative: 0 %
EOS ABS: 0 10*3/uL (ref 0–0.7)
EOS PCT: 0 %
HCT: 39.5 % — ABNORMAL LOW (ref 40.0–52.0)
Hemoglobin: 12.4 g/dL — ABNORMAL LOW (ref 13.0–18.0)
LYMPHS ABS: 0.6 10*3/uL — AB (ref 1.0–3.6)
Lymphocytes Relative: 4 %
MCH: 22.9 pg — AB (ref 26.0–34.0)
MCHC: 31.4 g/dL — ABNORMAL LOW (ref 32.0–36.0)
MCV: 73 fL — AB (ref 80.0–100.0)
MONO ABS: 1.1 10*3/uL — AB (ref 0.2–1.0)
Monocytes Relative: 7 %
Neutro Abs: 14.6 10*3/uL — ABNORMAL HIGH (ref 1.4–6.5)
Neutrophils Relative %: 89 %
PLATELETS: 284 10*3/uL (ref 150–440)
RBC: 5.42 MIL/uL (ref 4.40–5.90)
RDW: 19.6 % — AB (ref 11.5–14.5)
WBC: 16.4 10*3/uL — AB (ref 3.8–10.6)

## 2017-03-18 LAB — URINALYSIS, COMPLETE (UACMP) WITH MICROSCOPIC
BACTERIA UA: NONE SEEN
Bilirubin Urine: NEGATIVE
GLUCOSE, UA: NEGATIVE mg/dL
Hgb urine dipstick: NEGATIVE
KETONES UR: NEGATIVE mg/dL
Leukocytes, UA: NEGATIVE
NITRITE: NEGATIVE
PROTEIN: NEGATIVE mg/dL
Specific Gravity, Urine: 1.018 (ref 1.005–1.030)
pH: 5 (ref 5.0–8.0)

## 2017-03-18 LAB — PROTIME-INR
INR: 1.79
Prothrombin Time: 21 seconds — ABNORMAL HIGH (ref 11.4–15.2)

## 2017-03-18 MED ORDER — SODIUM BICARBONATE 8.4 % IV SOLN
50.0000 meq | Freq: Once | INTRAVENOUS | Status: AC
  Administered 2017-03-18: 50 meq via INTRAVENOUS

## 2017-03-18 MED ORDER — SODIUM BICARBONATE 8.4 % IV SOLN
INTRAVENOUS | Status: DC
  Administered 2017-03-18: 16:00:00 via INTRAVENOUS
  Filled 2017-03-18 (×3): qty 100

## 2017-03-18 MED ORDER — HYDROCORTISONE NA SUCCINATE PF 100 MG IJ SOLR
100.0000 mg | Freq: Once | INTRAMUSCULAR | Status: AC
  Administered 2017-03-18: 100 mg via INTRAVENOUS
  Filled 2017-03-18: qty 2

## 2017-03-18 MED ORDER — SODIUM BICARBONATE 8.4 % IV SOLN
INTRAVENOUS | Status: AC
  Administered 2017-03-18: 50 meq via INTRAVENOUS
  Filled 2017-03-18: qty 50

## 2017-03-18 MED ORDER — DEXTROSE 5 % IV SOLN
1.0000 g | Freq: Once | INTRAVENOUS | Status: AC
  Administered 2017-03-18: 1 g via INTRAVENOUS
  Filled 2017-03-18: qty 1

## 2017-03-18 MED ORDER — VANCOMYCIN HCL IN DEXTROSE 750-5 MG/150ML-% IV SOLN: 750.0000 mg | INTRAVENOUS | Status: DC

## 2017-03-18 MED ORDER — INSULIN ASPART 100 UNIT/ML ~~LOC~~ SOLN
SUBCUTANEOUS | Status: AC
  Filled 2017-03-18: qty 1

## 2017-03-18 MED ORDER — EPINEPHRINE PF 1 MG/ML IJ SOLN
INTRAMUSCULAR | Status: AC
  Filled 2017-03-18: qty 4

## 2017-03-18 MED ORDER — HYDROCORTISONE NA SUCCINATE PF 100 MG IJ SOLR: 100.0000 mg | Freq: Three times a day (TID) | INTRAMUSCULAR | Status: DC

## 2017-03-18 MED ORDER — VASOPRESSIN 20 UNIT/ML IV SOLN
0.0300 [IU]/min | INTRAVENOUS | Status: DC
  Filled 2017-03-18: qty 2

## 2017-03-18 MED ORDER — SODIUM CHLORIDE 0.9 % IV BOLUS (SEPSIS)
1000.0000 mL | Freq: Once | INTRAVENOUS | Status: AC
  Administered 2017-03-18: 1000 mL via INTRAVENOUS

## 2017-03-18 MED ORDER — SODIUM BICARBONATE 8.4 % IV SOLN
INTRAVENOUS | Status: AC | PRN
  Administered 2017-03-18 (×4): 50 meq via INTRAVENOUS

## 2017-03-18 MED ORDER — CALCIUM CHLORIDE 10 % IV SOLN
INTRAVENOUS | Status: AC
  Filled 2017-03-18: qty 10

## 2017-03-18 MED ORDER — NOREPINEPHRINE BITARTRATE 1 MG/ML IV SOLN
0.0000 ug/min | INTRAVENOUS | Status: DC
  Administered 2017-03-18: 5 ug/min via INTRAVENOUS
  Filled 2017-03-18: qty 4

## 2017-03-18 MED ORDER — CALCIUM CHLORIDE 10 % IV SOLN
INTRAVENOUS | Status: AC | PRN
  Administered 2017-03-18 (×2): 1 g via INTRAVENOUS

## 2017-03-18 MED ORDER — CALCIUM GLUCONATE 10 % IV SOLN
1.0000 g | Freq: Once | INTRAVENOUS | Status: AC
  Administered 2017-03-18: 1 g via INTRAVENOUS
  Filled 2017-03-18: qty 10

## 2017-03-18 MED ORDER — SODIUM BICARBONATE 8.4 % IV SOLN
INTRAVENOUS | Status: AC
  Filled 2017-03-18: qty 50

## 2017-03-18 MED ORDER — DEXTROSE 50 % IV SOLN
INTRAVENOUS | Status: AC | PRN
  Administered 2017-03-18: 1 via INTRAVENOUS

## 2017-03-18 MED ORDER — ATROPINE SULFATE 1 MG/ML IJ SOLN
INTRAMUSCULAR | Status: AC | PRN
  Administered 2017-03-18: .5 mg via INTRAVENOUS

## 2017-03-18 MED ORDER — SODIUM POLYSTYRENE SULFONATE 15 GM/60ML PO SUSP: 30.0000 g | Freq: Once | ORAL | Status: DC

## 2017-03-18 MED ORDER — VANCOMYCIN HCL IN DEXTROSE 1-5 GM/200ML-% IV SOLN
1000.0000 mg | Freq: Once | INTRAVENOUS | Status: AC
  Administered 2017-03-18: 1000 mg via INTRAVENOUS
  Filled 2017-03-18: qty 200

## 2017-03-18 MED ORDER — EPINEPHRINE PF 1 MG/10ML IJ SOSY
PREFILLED_SYRINGE | INTRAMUSCULAR | Status: AC | PRN
Start: 2017-03-18 — End: 2017-03-18
  Administered 2017-03-18 (×5): 1 via INTRAVENOUS

## 2017-03-18 MED ORDER — DEXTROSE 5 % IV SOLN: 1.0000 g | INTRAVENOUS | Status: DC

## 2017-03-18 MED ORDER — INSULIN ASPART 100 UNIT/ML ~~LOC~~ SOLN
10.0000 [IU] | Freq: Once | SUBCUTANEOUS | Status: AC
  Administered 2017-03-18: 10 [IU] via INTRAVENOUS

## 2017-03-19 LAB — BLOOD GAS, VENOUS
Acid-base deficit: 21.2 mmol/L — ABNORMAL HIGH (ref 0.0–2.0)
Bicarbonate: 7 mmol/L — ABNORMAL LOW (ref 20.0–28.0)
PATIENT TEMPERATURE: 37
PCO2 VEN: 23 mmHg — AB (ref 44.0–60.0)
PH VEN: 7.09 — AB (ref 7.250–7.430)

## 2017-03-19 LAB — COMPREHENSIVE METABOLIC PANEL
ALT: 1817 U/L — ABNORMAL HIGH (ref 17–63)
ANION GAP: 26 — AB (ref 5–15)
AST: 2472 U/L — ABNORMAL HIGH (ref 15–41)
Albumin: 2 g/dL — ABNORMAL LOW (ref 3.5–5.0)
Alkaline Phosphatase: 92 U/L (ref 38–126)
BILIRUBIN TOTAL: 2 mg/dL — AB (ref 0.3–1.2)
BUN: 48 mg/dL — ABNORMAL HIGH (ref 6–20)
CALCIUM: 10.7 mg/dL — AB (ref 8.9–10.3)
CO2: 9 mmol/L — ABNORMAL LOW (ref 22–32)
Chloride: 105 mmol/L (ref 101–111)
Creatinine, Ser: 3.31 mg/dL — ABNORMAL HIGH (ref 0.61–1.24)
GFR calc Af Amer: 20 mL/min — ABNORMAL LOW (ref >60)
GFR, EST NON AFRICAN AMERICAN: 17 mL/min — AB (ref >60)
Glucose, Bld: 572 mg/dL (ref 65–99)
POTASSIUM: 5.8 mmol/L — AB (ref 3.5–5.1)
Sodium: 140 mmol/L (ref 135–145)
TOTAL PROTEIN: 4.5 g/dL — AB (ref 6.5–8.1)

## 2017-03-20 ENCOUNTER — Telehealth: Payer: Self-pay

## 2017-03-20 NOTE — Telephone Encounter (Signed)
Per chart review tab pt seen [facility] ED 04/07/2017.

## 2017-03-20 NOTE — Telephone Encounter (Signed)
PLEASE NOTE: All timestamps contained within this report are represented as [place] Standard Time. CONFIDENTIALTY NOTICE: This fax transmission is intended only for the addressee. It contains information that is legally privileged, confidential or otherwise protected from use or disclosure. If you are not the intended recipient, you are strictly prohibited from reviewing, disclosing, copying using or disseminating any of this information or taking any action in reliance on or regarding this information. If you have received this fax in error, please notify us immediately by telephone so that we can arrange for its return to [place]. Phone: [phone], Toll-Free: [phone], Fax: [phone] Page: 1 of 1 Call Id: [id number] [place] Night - Client Nonclinical Telephone Record [place] Night - Client Client Site [place] Physician Renford Dills - MD Contact Type Call Who Is Calling Physician / Provider / Hospital Call Type Provider Call [facility] Page Now Reason for Call Request to speak to Physician Initial Comment Dr. Kerman Passey w/[facility] [phone]) paging Dr. Damita Dunnings to consult. Additional Comment Patient Name Zimri Brennen Patient DOB 02/15/1946 Requesting Provider Dr Kerman Passey Physician Number [phone] Facility Name [facility] Paging DoctorName Phone DateTime Result/Outcome Message Type Notes Renford Dills - MD [phone] 03-23-2017 7:12:33 PM Paged On Call Back to Call Center Doctor Paged Renford Dills - MD 03-23-17 7:35:06 PM Spoke with On Call - General Message Result Transferred Dr. Damita Dunnings to Dr. Ulice Dash. Dr. Damita Dunnings indicated he was only on call for outpatients, but would speak to them to make sure he was or was not covering for this patient. Call Closed By: Noelle Penner Transaction Date/Time: 23-Mar-2017 7:00:40 PM (ET)

## 2017-03-20 NOTE — Telephone Encounter (Signed)
Called and LMOVM for wife, home and cell, and on daughter's phone.  No answer.  Unable to LMOVM x3.  Will try again later.  I was always glad to see this kind man in clinic and he will be missed.

## 2017-03-21 NOTE — Telephone Encounter (Signed)
Called wife and offered condolences.  She thanked me for the call.

## 2017-03-23 LAB — CULTURE, BLOOD (ROUTINE X 2)
CULTURE: NO GROWTH
Culture: NO GROWTH

## 2017-04-12 NOTE — Code Documentation (Signed)
Pulse check. No pulse found. Resume CPR.

## 2017-04-12 NOTE — Code Documentation (Signed)
Pulse check. Pulse found.

## 2017-04-12 NOTE — Code Documentation (Addendum)
Patient became bradycardic and pulseless. Resume CPR.

## 2017-04-12 NOTE — ED Provider Notes (Signed)
-----------------------------------------   4:59 PM on April 08, 2017 -----------------------------------------  Patient care assumed from Dr. Alfred Levins.  Shortly after assuming patient care the patient became very bradycardic down to 20 bpm. Family including the wife is at the bedside and made the decision previously not to resuscitate any further. Given the patient's chronic medical conditions I believe this is a very reasonable plan of care. Patient's heart rhythm then transitioned to asystole. Patient's time of death was pronounced at 4:55 PM, drips and ventilator were then turned off.   Harvest Dark, MD 2017-04-08 1700

## 2017-04-12 NOTE — Code Documentation (Signed)
Family updated as to patient's status by EDP.

## 2017-04-12 NOTE — Code Documentation (Signed)
Epi drip mixed up at 4mg /291mL bag. Started per EDP in the right [facility].

## 2017-04-12 NOTE — Consult Note (Signed)
Pulmonary/critical care  Responding to [place] in emergency department Patient with underlying history of cadaveric renal transplantation, presently in septic shock, on vasopressin, epinephrine and norepinephrine, severe metabolic acidosis, receiving multiple amps of bicarbonate, hyperkalemia received calcium. Suffered 2 cardiac arrests now with blood pressure and pulse agonal. Discussions with family wish him to be a DO NOT RESUSCITATE. If patient survives will be available to help assist in his care in the intensive care unit.  Hermelinda Dellen, D.O.

## 2017-04-12 NOTE — ED Notes (Signed)
[place] for consult with Renal Transplant  [id number]

## 2017-04-12 NOTE — ED Triage Notes (Signed)
Pt arrived to ED from home reporting increased WOB and SOB starting last night and worsening today. PT verbalized having generalized body aches as well as SOB. EMS reports pt is on RA at home. EMS placed pt on 3L oxygen due to CO2 of 9 en route to ED. Pt is alert and oriented upon arrival to ED but appears tired. Pt denies having had a cough or fevers at home. Pt currently undergoing radiation. Wife reports pt has never been treated with chemo for current skin cancer.   Hx of CHF, MI, HTN, and borderline diabetes.

## 2017-04-12 NOTE — ED Notes (Signed)
Pt placed under bear hugger.

## 2017-04-12 NOTE — Progress Notes (Signed)
   21-Mar-2017 1500  Clinical Encounter Type  Visited With Patient and family together;Family  Visit Type Initial;ED;Critical Care;Patient actively dying;Spiritual support  Referral From Physician  Spiritual Encounters  Spiritual Needs Grief support;Emotional  MD contacted [place] to come support patient and family for EOL; Spiritual support offered and CH will monitor once Pt in ICU as needed or requested.

## 2017-04-12 NOTE — Discharge Summary (Signed)
Pt was admitted for severe sepsis and shock, with shock liver, Ac on ch renal failure , Hyperkalemia, pneumonia, lactic acidosis, metabolic acidosis.  Started on treatment by [employer] Abx, IV fluids, Bicarb, Calcium gluconate, vasopressors. Within next 2-3 hours he had worsening in his BP and bradycardia with cardiac arrest and trials of resussitation, and he died.

## 2017-04-12 NOTE — Code Documentation (Addendum)
Pulse check. Pulse found. CPR stopped.

## 2017-04-12 NOTE — ED Provider Notes (Addendum)
[facility] Emergency Department Provider Note  ____________________________________________  Time seen: Approximately 3:19 PM  I have reviewed the triage vital signs and the nursing notes.   HISTORY  Chief Complaint Shortness of Breath  Level 5 caveat:  Portions of the history and physical were unable to be obtained due to respiratory distress   HPI Edward Oconnell is a 71 y.o. male with a history of renal transplant in 2012 and 2014, skin cancer on radiation therapy, hypertension, diabetes, coronary artery disease status post STEMI complicated by cardiac arrest who presents for evaluation of shortness of breath. According to the wife patient has had decreased appetite and has not been eating and drinking as much since she started radiation however worse in the last week. This morning he woke up complaining of generalized body pain and difficulty breathing which prompted EMS to be called. No fevers at home. Patient had one episode of vomiting at 2 AM. No diarrhea. Patient has had no cough. He has been compliant with his medications. Per EMS patient was hypotensive with systolics in the 48N upon arrival, normal heart rate, clear lungs. Patient was not hypoxic but due to increased work of breathing was placed on oxygen per EMS.  Past Medical History:  Diagnosis Date  . Altered mental state    with cryptococcal Ag positive on lumbar puncture 2015 at [facility]  . Benign prostatic hypertrophy    a. Nonobstructive on 2004 cath b. anterior STEMI s/p DES-pLAD 12/05/12  . CAD (coronary artery disease)   . Cardiac arrest ([place]) 12/05/2012   In the setting of anterior STEMI  . Chronic kidney disease (CKD), stage IV (severe) ([place]) 12/08/2012   Of renal transplant, done 2010-10-02 at [facility]   . Diabetes mellitus without complication ([employer])   . GERD (gastroesophageal reflux disease)   . History of shingles 08/2012   Lt eye shingles  . Hyperlipidemia   . Hypertension   .  Hypothyroidism   . 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 Creat was 6 when he rec'd a deceased donor transplant 10/02/10 at [facility].  According to patient he did OK then in the first few weeks after surgery had to go back in hospital for "BP" problems that "messed the kidney up". Since then creatinine has been in 2.3-3.0 range.  Sees only [place] nephrologists, gets most care there.  Takes prograf and Myfortic, no steroids.  PKD kidneys were left in place.    . Skin cancer   . Stroke [facility]) 2009    Patient Active Problem List   Diagnosis Date Noted  . Severe sepsis ([place]) 03-28-17  . Parotid tumor 11/25/2016  . Fatigue 07/26/2016  . Cough 08/13/2015  . URI (upper respiratory infection) 02/23/2015  . Atrial fibrillation ([place]) 11/04/2013  . Pulmonary nodules 10/28/2013  . Depression 10/20/2013  . ST elevation myocardial infarction (STEMI) of anterior wall ([place]) 12/12/2012  . Bradycardia 12/12/2012  . Normocytic anemia 12/12/2012  . Hypokalemia 12/12/2012  . Cardiogenic shock ([place]) 12/12/2012  . Cardiac arrest ([place]) 12/12/2012  . GERD (gastroesophageal reflux disease) 12/12/2012  . S/p cadaver renal transplant 12/08/2012  . CKD (chronic kidney disease) stage 4, GFR 15-29 ml/min (HCC) 12/08/2012  . Herpes zoster 05/30/2012  . Erectile dysfunction 04/02/2007  . Hypothyroidism 03/29/2007  . Diabetes mellitus ([place]) 03/29/2007  . HYPERLIPIDEMIA, MIXED 03/29/2007  . Essential hypertension 03/29/2007  . CAD (coronary artery disease) 03/29/2007  . STROKE 03/29/2007  . HEMORRHOIDS  03/29/2007  . BENIGN PROSTATIC HYPERTROPHY 03/29/2007  . Polycystic kidney 03/29/2007    Past Surgical History:  Procedure Laterality Date  . CARDIAC CATHETERIZATION  2004   Nonobstructive  . CORONARY ANGIOPLASTY WITH STENT PLACEMENT  12/05/2012   30% oLAD, 20% mLAD, 95-99% pLAD s/p DES, 60% oD1, 70-80% pRamus, 40% pLCx, 50% AV groove Cx, 50% PLB, 100% mRCA s/p recanalization  intra-procedurally, noted to be small non-dominant with 70% pRCA stenosis post-procedure  . ENUCLEATION  2016   R eye  . EYE SURGERY    . FACIAL RECONSTRUCTION SURGERY  2015   [facility]  . KIDNEY TRANSPLANT     09/2010  . LEFT HEART CATHETERIZATION WITH CORONARY ANGIOGRAM N/A 12/05/2012   Procedure: LEFT HEART CATHETERIZATION WITH CORONARY ANGIOGRAM;  Surgeon: Burnell Blanks, MD;  Location: [facility] CATH LAB;  Service: Cardiovascular;  Laterality: N/A;  . OTHER SURGICAL HISTORY  11/21/12   cancerous cells removed from mid upper chest  . OTHER SURGICAL HISTORY N/A 11/15/2012   pt had skin cancer removed from mid upper chest    Prior to Admission medications   Medication Sig Start Date End Date Taking? Authorizing Provider  amLODipine (NORVASC) 5 MG tablet Take 5 mg by mouth daily.   Yes [provider]  atorvastatin (LIPITOR) 10 MG tablet Take 1 tablet (10 mg total) by mouth daily. 09/23/16  Yes Burnell Blanks, MD  Cholecalciferol (VITAMIN D-1000 MAX ST) 1000 UNITS tablet Take 1,000 Units by mouth.  06/18/15  Yes [provider]  clopidogrel (PLAVIX) 75 MG tablet TAKE 1 TABLET (75 MG TOTAL) BY MOUTH DAILY. 01/04/17  Yes Burnell Blanks, MD  fluconazole (DIFLUCAN) 50 MG tablet Take 1 tablet by mouth daily. 01/08/17  Yes [provider]  furosemide (LASIX) 20 MG tablet Take 20 mg by mouth daily.   Yes [provider]  glimepiride (AMARYL) 1 MG tablet TAKE 1 TABLET (1 MG TOTAL) BY MOUTH DAILY WITH BREAKFAST. 08/02/16  Yes Tonia Ghent, MD  hydrALAZINE (APRESOLINE) 25 MG tablet Take 25 mg by mouth 3 (three) times daily.    Yes [provider]  levothyroxine (SYNTHROID, LEVOTHROID) 150 MCG tablet Take 1 tablet (150 mcg total) by mouth daily before breakfast. 10/12/16  Yes Tonia Ghent, MD  magnesium oxide (MAG-OX) 400 MG tablet Take 400 mg by mouth 2 (two) times daily.    Yes [provider]  nitroGLYCERIN (NITROSTAT) 0.4 MG SL  tablet Place 1 tablet (0.4 mg total) under the tongue every 5 (five) minutes x 3 doses as needed for chest pain. 11/19/15  Yes Burnell Blanks, MD  pantoprazole (PROTONIX) 40 MG tablet TAKE 1 TABLET (40 MG TOTAL) BY MOUTH DAILY. 09/06/16  Yes Burnell Blanks, MD  predniSONE (DELTASONE) 10 MG tablet Take 10 mg by mouth daily with breakfast.   Yes [provider]  sodium bicarbonate 650 MG tablet Take 650 mg by mouth 2 (two) times daily.  07/19/14  Yes [provider]  vardenafil (LEVITRA) 10 MG tablet Take 1 tablet (10 mg total) by mouth daily as needed for erectile dysfunction. 08/03/16  Yes Tonia Ghent, MD  vitamin B-12 (CYANOCOBALAMIN) 1000 MCG tablet Take 1 tablet by mouth daily. 06/18/15  Yes [provider]  glucose blood (ONE TOUCH ULTRA TEST) test strip Use as instructed to test blood sugar daily. Dx code E11.9 09/30/16   Tonia Ghent, MD    Allergies Amiodarone; Nsaids; and Tolmetin  Family History  Problem Relation  Age of Onset  . Kidney failure Mother        was on dialysis 3 times a week when she died  . Alcohol abuse Mother   . Hypertension Father   . Heart attack Sister   . Kidney disease Sister   . Aneurysm Sister   . COPD Sister   . Colon cancer Neg Hx   . Prostate cancer Neg Hx     Social History Social History  Substance Use Topics  . Smoking status: Never Smoker  . Smokeless tobacco: Never Used  . Alcohol use No    Review of Systems  Constitutional: Negative for fever. + generalized body aches Eyes: Negative for visual changes. ENT: Negative for sore throat. Neck: No neck pain  Cardiovascular: Negative for chest pain. Respiratory: + shortness of breath. Gastrointestinal: Negative for abdominal pain. + vomiting Genitourinary: Negative for dysuria. Musculoskeletal: Negative for back pain. Skin: Negative for rash. Neurological: Negative for headaches Psych: No SI or  HI  ____________________________________________   PHYSICAL EXAM:  VITAL SIGNS: ED Triage Vitals  Enc Vitals Group     BP 2017/03/25 1325 (!) 73/57     Pulse Rate 2017-03-25 1308 84     Resp 03-25-17 1308 (!) 38     Temp 2017/03/25 1329 (!) 96.4 F (35.8 C)     Temp Source 2017/03/25 1308 Oral     SpO2 03-25-2017 1306 96 %     Weight --      Height --      Head Circumference --      Peak Flow --      Pain Score --      Pain Loc --      Pain Edu? --      Excl. in [place]? --     Constitutional: Somnolent but easily arousable, moderate respiratory distress.  HEENT:      Head: Normocephalic and atraumatic.         Eyes: Conjunctivae are normal. Sclera is non-icteric.       Mouth/Throat: Mucous membranes are dry.       Neck: Supple with no signs of meningismus. Cardiovascular: Regular rate and rhythm. No murmurs, gallops, or rubs.Mottled skin Respiratory: Increased work of breathing, grunting, tachypneic, bilateral lungs are clear to auscultation with good air movement, no crackles or wheezes  Gastrointestinal: Soft, non tender, and non distended with positive bowel sounds. No rebound or guarding. Musculoskeletal: No edema or erythema.  Neurologic: Face is symmetric. Moving all extremities.  Skin: Skin is warm but mottled  ____________________________________________   LABS (all labs ordered are listed, but only abnormal results are displayed)  Labs Reviewed  COMPREHENSIVE METABOLIC PANEL - Abnormal; Notable for the following:       Result Value   Potassium 6.0 (*)    CO2 <7 (*)    Glucose, Bld 147 (*)    BUN 52 (*)    Creatinine, Ser 3.49 (*)    Albumin 3.2 (*)    AST 1,604 (*)    ALT 1,348 (*)    Total Bilirubin 2.6 (*)    GFR calc non Af Amer 16 (*)    GFR calc Af Amer 19 (*)    All other components within normal limits  LACTIC ACID, PLASMA - Abnormal; Notable for the following:    Lactic Acid, Venous 14.1 (*)    All other components within normal limits  CBC WITH  DIFFERENTIAL/PLATELET - Abnormal; Notable for the following:    WBC 16.4 (*)  Hemoglobin 12.4 (*)    HCT 39.5 (*)    MCV 73.0 (*)    MCH 22.9 (*)    MCHC 31.4 (*)    RDW 19.6 (*)    Neutro Abs 14.6 (*)    Lymphs Abs 0.6 (*)    Monocytes Absolute 1.1 (*)    All other components within normal limits  PROTIME-INR - Abnormal; Notable for the following:    Prothrombin Time 21.0 (*)    All other components within normal limits  URINALYSIS, COMPLETE (UACMP) WITH MICROSCOPIC - Abnormal; Notable for the following:    Color, Urine YELLOW (*)    APPearance CLEAR (*)    Squamous Epithelial / LPF 0-5 (*)    All other components within normal limits  BLOOD GAS, VENOUS - Abnormal; Notable for the following:    pH, Ven 7.09 (*)    pCO2, Ven 23 (*)    Bicarbonate 7.0 (*)    Acid-base deficit 21.2 (*)    All other components within normal limits  CULTURE, BLOOD (ROUTINE X 2)  CULTURE, BLOOD (ROUTINE X 2)  LACTIC ACID, PLASMA  COMPREHENSIVE METABOLIC PANEL   ____________________________________________  EKG  ED ECG REPORT I, Rudene Re, the attending physician, personally viewed and interpreted this ECG.  13:06 - normal sinus rhythm, rate of 89, normal intervals, right axis deviation, low voltage QRS, frequent PVCs, slight ST depressions in lateral leads, no ST elevation.  14:18 - normal sinus rhythm, rate of 70, first-degree AV block, prolonged QTC, low voltage QRS, normal axis, occasional PVCs, no ST elevations or depressions.  ____________________________________________  RADIOLOGY  CXR: Probable interstitial edema with possible layering effusions. Dedicated upright PA and lateral chest x-ray, when patient is able, would be helpful to further evaluate. ____________________________________________   PROCEDURES  Procedure(s) performed:yes .Central Line Date/Time: 04/05/17 3:39 PM Performed by: Rudene Re Authorized by: Rudene Re   Consent:     Consent obtained:  Written   Consent given by:  Spouse   Risks discussed:  Arterial puncture, incorrect placement, nerve damage, bleeding and infection Pre-procedure details:    Hand hygiene: Hand hygiene performed prior to insertion     Sterile barrier technique: All elements of maximal sterile technique followed     Skin preparation:  2% chlorhexidine   Skin preparation agent: Skin preparation agent completely dried prior to procedure   Anesthesia (see MAR for exact dosages):    Anesthesia method:  Local infiltration   Local anesthetic:  Lidocaine 1% w/o epi Procedure details:    Location:  L femoral   Patient position:  Trendelenburg   Procedural supplies:  Triple lumen   Landmarks identified: yes     Number of attempts:  1   Successful placement: yes   Post-procedure details:    Post-procedure:  Dressing applied   Assessment:  Blood return through all ports and free fluid flow   Patient tolerance of procedure:  Tolerated well, no immediate complications  Procedure Name: Intubation Date/Time: 03/19/2017 8:43 AM Performed by: Rudene Re Pre-anesthesia Checklist: Suction available, Emergency Drugs available and Patient being monitored Oxygen Delivery Method: Ambu bag Ventilation: Mask ventilation with difficulty Laryngoscope Size: Glidescope and 3 Tube size: 7.5 mm Number of attempts: 1 Placement Confirmation: ETT inserted through vocal cords under direct vision,  Positive ETCO2,  CO2 detector and Breath sounds checked- equal and bilateral Tube secured with: ETT holder Difficulty Due To: Difficulty was anticipated      Critical Care performed: yes  CRITICAL CARE Performed by: [place]  Alfred Levins  ?  Total critical care time: 80 min  Critical care time was exclusive of separately billable procedures and treating other patients.  Critical care was necessary to treat or prevent imminent or life-threatening deterioration.  Critical care was time spent personally  by me on the following activities: development of treatment plan with patient and/or surrogate as well as nursing, discussions with consultants, evaluation of patient's response to treatment, examination of patient, obtaining history from patient or surrogate, ordering and performing treatments and interventions, ordering and review of laboratory studies, ordering and review of radiographic studies, pulse oximetry and re-evaluation of patient's condition.  ____________________________________________   INITIAL IMPRESSION / ASSESSMENT AND PLAN / ED COURSE  71 y.o. male with a history of renal transplant in 2012 and 2014, skin cancer on radiation therapy, hypertension, diabetes, coronary artery disease status post STEMI complicated by cardiac arrest who presents for evaluation of shortness of breath. Patient arrives with significant increased work of breathing, grunting but not hypoxic and with clear lungs. His end-tidal CO2 was 9 concerning for severe sepsis. Discussion about goals of care with patient's wife who recommended patient remained full code at this time. Chest x-ray with no infiltrate. The patient persistently hypotensive after 2 L of IV fluids and was started on norepinephrine. A left femoral central line was placed. Blood work showing lactic acid of 14,000, CMP showing worsening kidney function with creatinine of 3.4, bicarbonate of less than 7, K of 6.0. Evidence of shock liver with LFTs in the 1000s. White cells 16,000 with stable platelets and hemoglobin. UA with no evidence of UTI. Blood gas showing pH of 7.09 with a PCO2 of 23 concerning for metabolic acidosis. Patient was treated with cefepime and vancomycin for potential infection. Given two amps of bicarbonate, stress dose steroids, and started on NE. The intensivist was consulted for admission. I had a long discussion with the wife, son and daughter the patient is critically ill and that there is great chance the patient will pass within  the next 24 hours. I recommended that they contact his family and all of those who wish to come and say goodbye. Family was very grateful for the care received in the emergency room.  Clinical Course as of Mar 18 1636  Sat Mar 18, 2017  1633 I was in the room evaluating the patient when I noted the patient stop breathing. When I check for pulse patient was found to be pulseless. CPR was started per ACLS protocol. Patient received multiple rounds of epinephrine, bicarbonate, calcium, D50, insulin. Patient achieved ROSC x2 with no corneal, pupillary, cough, or gag reflexes. Patient currently is on maxed epi, NE, and vasopressin drips with hypotension and bradycardia. Atropine was unsuccessful. I had further discussion with his wife who decided that if patient loses his pulse again we will no longer resuscitate him. Patient made DNR.   [CV]    Clinical Course User Index [CV] Rudene Re, MD    Pertinent labs & imaging results that were available during my care of the patient were reviewed by me and considered in my medical decision making (see chart for details).    ____________________________________________   FINAL CLINICAL IMPRESSION(S) / ED DIAGNOSES  Final diagnoses:  Sepsis, due to unspecified organism ([place])  Shock liver  Metabolic acidosis  Acute renal failure superimposed on chronic kidney disease, unspecified CKD stage, unspecified acute renal failure type ([facility])  Hyperkalemia  Respiratory distress  Cardiac arrest ([facility])      NEW MEDICATIONS STARTED DURING  THIS VISIT:  New Prescriptions   No medications on file     Note:  This document was prepared using Dragon voice recognition software and may include unintentional dictation errors.    Alfred Levins, [place], MD 04/03/2017 [place], [place], MD 04/03/17 [place], [place], MD 03/19/17 [phone]

## 2017-04-12 NOTE — Progress Notes (Signed)
Pharmacy Antibiotic Note  Edward Oconnell is a 71 y.o. male admitted on 2017-04-03 with sepsis.  Pharmacy has been consulted for cefepime and vancomycin dosing.  Plan: 1. Cefepime 1 gm IV Q24H 2 Vancomycin 1 gm IV x 1 in ED followed in approximately 24 hours (stacked dosing) by vancomycin 750 mg IV Q48H - predicted trough 18 mcg/mL. Pharmacy will continue to follow and adjust as needed to maintain trough 15 to 20 mcg/mL.   Vd 35.8 L, Ke 0.016 hr-1, T1/2 43.1 hr  Height: 5\' 7"  (170.2 cm) Weight: 112 lb 14 oz (51.2 kg) IBW/kg (Calculated) : 66.1  Temp (24hrs), Avg:96.4 F (35.8 C), Min:96.4 F (35.8 C), Max:96.4 F (35.8 C)   Recent Labs Lab 04/03/2017 1307 April 03, 2017 1308  WBC 16.4*  --   CREATININE 3.49*  --   LATICACIDVEN  --  14.1*    Estimated Creatinine Clearance: 14.1 mL/min (A) (by C-G formula based on SCr of 3.49 mg/dL (H)).    Allergies  Allergen Reactions  . Amiodarone Other (See Comments)    Abnormal LFTs  . Nsaids     Kidney disease  . Tolmetin     Kidney disease    Thank you for allowing pharmacy to be a part of this patient's care.  Laural Benes, Pharm.D., BCPS Clinical Pharmacist 04-03-2017 3:27 PM

## 2017-04-12 NOTE — Code Documentation (Signed)
Family at beside. Family given emotional support. 

## 2017-04-12 NOTE — Code Documentation (Signed)
Pulse check. No pulse found. Resume CPR

## 2017-04-12 NOTE — ED Notes (Signed)
Pt has been taken off the floor to the morgue.

## 2017-04-12 NOTE — H&P (Signed)
[facility] NAME: Edward Oconnell    MR#:  [id number]  DATE OF BIRTH:  [birth year]  DATE OF ADMISSION:  Mar 25, 2017  PRIMARY CARE PHYSICIAN: Tonia Ghent, MD   REQUESTING/REFERRING PHYSICIAN: veronese  CHIEF COMPLAINT:   Chief Complaint  Patient presents with  . Shortness of Breath    HISTORY OF PRESENT ILLNESS: Edward Oconnell  is a 71 y.o. 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 He did not eat or drink much. For last 1 week she did not had much shortness of breath but today he started feeling worsening short of breath. Wife is in the room in emergency room and she denies any fever. But patient became increasingly drowsy today. In ER he is noted to have blood pressure 70/40, lactic acid around 15, severe metabolic acidosis and pH of blood is 7.09, shock liver with liver enzymes in the range of 1000, acute on chronic renal failure with creatinine 3.5 and potassium of 6. Patient is given 2 L IV fluid bolus in the ER, started on hold air blankets and vasopressors to support his blood pressure. Given to hospitalist team for admission after talking to ICU team.  PAST MEDICAL HISTORY:   Past Medical History:  Diagnosis Date  . 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 Benign prostatic hypertrophy    a. Nonobstructive on 2004 cath b. anterior STEMI s/p DES-pLAD 12/05/12  . CAD (coronary artery disease)   . Cardiac arrest ([place]) 12/05/2012   In the setting of anterior STEMI  . Chronic kidney disease (CKD), stage IV (severe) ([place]) 12/08/2012   Of renal transplant, done 09/29/10 at [facility]   . Diabetes mellitus without complication ([place])   . GERD  (gastroesophageal reflux disease)   . History of shingles 08/2012   Lt eye shingles  . Hyperlipidemia   . Hypertension   . Hypothyroidism   . 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 Creat was 6 when he rec'd a deceased donor transplant 29-Sep-2010 at [facility].  According to patient he did OK then in the first few weeks after surgery had to go back in hospital for "BP" problems that "messed the kidney up". Since then creatinine has been in 2.3-3.0 range.  Sees only [facility] neph[facility]ists, gets most care there.  Takes prograf and Myfortic, no steroids.  PKD kidneys were left in place.    . Skin cancer   . Stroke [facility] 2009    PAST SURGICAL HISTORY: Past Surgical History:  Procedure Laterality Date  . CARDIAC CATHETERIZATION  2004   Nonobstructive  . CORONARY ANGIOPLASTY WITH STENT PLACEMENT  12/05/2012   30% oLAD, 20% mLAD, 95-99% pLAD s/p DES, 60% oD1, 70-80% pRamus, 40% pLCx, 50% AV groove Cx, 50% PLB, 100% mRCA s/p recanalization intra-procedurally, noted to be small non-dominant with 70% pRCA stenosis post-procedure  . ENUCLEATION  2016   R eye  . EYE SURGERY    . FACIAL RECONSTRUCTION SURGERY  2015   [facility]  . KIDNEY TRANSPLANT     September 29, 2010  . LEFT HEART CATHETERIZATION WITH CORONARY ANGIOGRAM N/A 12/05/2012   Procedure: LEFT HEART CATHETERIZATION WITH CORONARY ANGIOGRAM;  Surgeon: Burnell Blanks,  MD;  Location: [facility]ATH [facility];  Service: Cardiovascular;  Laterality: N/A;  . OTHER SURGICAL HISTORY  11/21/12   cancerous cells removed from mid upper chest  . OTHER SURGICAL HISTORY N/A 11/15/2012   pt had skin cancer removed from mid upper chest    SOCIAL HISTORY:  Social History  Substance Use Topics  . Smoking status: Never Smoker  . Smokeless tobacco: Never Used  . Alcohol use No    FAMILY HISTORY:  Family History  Problem Relation Age of Onset  . Kidney failure Mother        was on dialysis 3 times a week when she died  . Alcohol abuse Mother   .  Hypertension Father   . Heart attack Sister   . Kidney disease Sister   . Aneurysm Sister   . COPD Sister   . Colon cancer Neg Hx   . Prostate cancer Neg Hx     DRUG ALLERGIES:  Allergies  Allergen Reactions  . Amiodarone Other (See Comments)    Abnormal LFTs  . Nsaids     Kidney disease  . Tolmetin     Kidney disease    REVIEW OF SYSTEMS:   Pt is critically ill and drowsy, not able to give ROS.  MEDICATIONS AT HOME:  Prior to Admission medications   Medication Sig Start Date End Date Taking? Authorizing Provider  amLODipine (NORVASC) 5 MG tablet Take 5 mg by mouth daily.   Yes [provider]  atorvastatin (LIPITOR) 10 MG tablet Take 1 tablet (10 mg total) by mouth daily. 09/23/16  Yes Burnell Blanks, MD  Cholecalciferol (VITAMIN D-1000 MAX ST) 1000 UNITS tablet Take 1,000 Units by mouth.  06/18/15  Yes [provider]  clopidogrel (PLAVIX) 75 MG tablet TAKE 1 TABLET (75 MG TOTAL) BY MOUTH DAILY. 01/04/17  Yes Burnell Blanks, MD  fluconazole (DIFLUCAN) 50 MG tablet Take 1 tablet by mouth daily. 01/08/17  Yes [provider]  furosemide (LASIX) 20 MG tablet Take 20 mg by mouth daily.   Yes [provider]  glimepiride (AMARYL) 1 MG tablet TAKE 1 TABLET (1 MG TOTAL) BY MOUTH DAILY WITH BREAKFAST. 08/02/16  Yes Tonia Ghent, MD  hydrALAZINE (APRESOLINE) 25 MG tablet Take 25 mg by mouth 3 (three) times daily.    Yes [provider]  levothyroxine (SYNTHROID, LEVOTHROID) 150 MCG tablet Take 1 tablet (150 mcg total) by mouth daily before breakfast. 10/12/16  Yes Tonia Ghent, MD  magnesium oxide (MAG-OX) 400 MG tablet Take 400 mg by mouth 2 (two) times daily.    Yes [provider]  nitroGLYCERIN (NITROSTAT) 0.4 MG SL tablet Place 1 tablet (0.4 mg total) under the tongue every 5 (five) minutes x 3 doses as needed for chest pain. 11/19/15  Yes Burnell Blanks, MD  pantoprazole (PROTONIX) 40 MG tablet TAKE  1 TABLET (40 MG TOTAL) BY MOUTH DAILY. 09/06/16  Yes Burnell Blanks, MD  predniSONE (DELTASONE) 10 MG tablet Take 10 mg by mouth daily with breakfast.   Yes [provider]  sodium bicarbonate 650 MG tablet Take 650 mg by mouth 2 (two) times daily.  07/19/14  Yes [provider]  vardenafil (LEVITRA) 10 MG tablet Take 1 tablet (10 mg total) by mouth daily as needed for erectile dysfunction. 08/03/16  Yes Tonia Ghent, MD  vitamin B-12 (CYANOCOBALAMIN) 1000 MCG tablet Take 1 tablet by mouth daily. 06/18/15  Yes [provider]  glucose blood (ONE  TOUCH ULTRA TEST) test strip Use as instructed to test blood sugar daily. Dx code E11.9 09/30/16   Tonia Ghent, MD      PHYSICAL EXAMINATION:   VITAL SIGNS: Blood pressure 93/73, pulse 72, temperature (!) 96.4 F (35.8 C), temperature source Rectal, resp. rate (!) 31, SpO2 90 %.  GENERAL:  71 y.o.-year-old patient lying in the bed with Clinical appearance.  EYES: Pupils equal, round, reactive to light and accommodation. No scleral icterus. Extraocular muscles intact.  HEENT: Head atraumatic, normocephalic. Oropharynx and nasopharynx clear.  NECK:  Supple, no jugular venous distention. No thyroid enlargement, no tenderness. On lateral side of his neck on left, has some superficial scaling burnings secondary to his radiation. LUNGS: Normal breath sounds bilaterally, no wheezing, some crepitation. No use of accessory muscles of respiration.  CARDIOVASCULAR: S1, S2 normal. No murmurs, rubs, or gallops.  ABDOMEN: Soft, nontender, nondistended. Bowel sounds present. No organomegaly or mass.  EXTREMITIES: No pedal edema, cyanosis, or clubbing.  NEUROLOGIC: Patient is drowsy but easily arousable but appears confused, opens eyes and goes back to sleep on being called. PSYCHIATRIC: The patient is drowsy.  SKIN: No obvious rash, lesion, or ulcer.   LABORATORY PANEL:   CBC  Recent Labs Lab 04/15/17 1307  WBC  16.4*  HGB 12.4*  HCT 39.5*  PLT 284  MCV 73.0*  MCH 22.9*  MCHC 31.4*  RDW 19.6*  LYMPHSABS 0.6*  MONOABS 1.1*  EOSABS 0.0  BASOSABS 0.0   ------------------------------------------------------------------------------------------------------------------  Chemistries   Recent Labs Lab 2017/04/15 1307  NA 139  K 6.0*  CL 106  CO2 <7*  GLUCOSE 147*  BUN 52*  CREATININE 3.49*  CALCIUM 9.2  AST 1,604*  ALT 1,348*  ALKPHOS 107  BILITOT 2.6*   ------------------------------------------------------------------------------------------------------------------ CrCl cannot be calculated (Unknown ideal weight.). ------------------------------------------------------------------------------------------------------------------ No results for input(s): TSH, T4TOTAL, T3FREE, THYROIDAB in the last 72 hours.  Invalid input(s): FREET3   Coagulation profile  Recent Labs Lab 04-15-17 1307  INR 1.79   ------------------------------------------------------------------------------------------------------------------- No results for input(s): DDIMER in the last 72 hours. -------------------------------------------------------------------------------------------------------------------  Cardiac Enzymes No results for input(s): CKMB, TROPONINI, MYOGLOBIN in the last 168 hours.  Invalid input(s): CK ------------------------------------------------------------------------------------------------------------------ Invalid input(s): POCBNP  ---------------------------------------------------------------------------------------------------------------  Urinalysis    Component Value Date/Time   COLORURINE YELLOW (A) 04/15/2017 1308   APPEARANCEUR CLEAR (A) 04-15-2017 1308   LABSPEC 1.018 04-15-17 1308   PHURINE 5.0 04/15/17 1308   GLUCOSEU NEGATIVE April 15, 2017 1308   HGBUR NEGATIVE 15-Apr-2017 1308   BILIRUBINUR NEGATIVE 04-15-17 1308   BILIRUBINUR negative 01/05/2016 1002    KETONESUR NEGATIVE April 15, 2017 1308   PROTEINUR NEGATIVE 2017-04-15 1308   UROBILINOGEN negative 01/05/2016 1002   UROBILINOGEN 0.2 02/18/2015 1834   NITRITE NEGATIVE 2017-04-15 1308   LEUKOCYTESUR NEGATIVE 04-15-2017 1308     RADIOLOGY: Dg Chest Portable 1 View  Result Date: 04-15-17 CLINICAL DATA:  Renal transplant with diabetes. Shortness of breath. EXAM: PORTABLE CHEST 1 VIEW COMPARISON:  03/19/2016 FINDINGS: 1317 hour. Low volume film. Vascular congestion with interstitial pulmonary edema and bibasilar atelectasis. Possible layering bilateral pleural effusions. The cardio pericardial silhouette is enlarged. The visualized bony structures of the thorax are intact. Telemetry leads overlie the chest. IMPRESSION: Probable interstitial edema with possible layering effusions. Dedicated upright PA and lateral chest x-ray, when patient is able, would be helpful to further evaluate. Electronically Signed   By: Misty Stanley M.D.   On: 04-15-17 13:44    EKG: Orders placed or performed in visit on 01/20/17  .  EKG 12-Lead    IMPRESSION AND PLAN:  * Severe sepsis with shock, metabolic acidosis, lactic acidosis   Secondary to pneumonia    IV fluid boluses are given, currently on vasopressor. IV bicarbonate drip.   as patient is on oral prednisone, I will give IV hydrocortisone to help with his shock.  Broad-spectrum IV antibiotics, blood cultures are sent.  * Acute on chronic renal failure   Baseline creatinine 1.9   Now worsened secondary to shock   We'll give IV fluid and follow renal function, I spoke to nephrologist.   Patient is a renal transplant patient.  * Hyperkalemia   I spoke to nephrologist on phone.   Calcium gluconate injection.   Kayexalate rectally.  * Shock liver with elevated liver function enzymes.   Continue monitoring with IV fluid and treatment.  * Diabetes   Hold oral medications, keep checking blood sugars every 4 hours.  * Hypertension   Hold oral  medications currently because of hypotension.  * Coronary artery disease   Continue Plavix.  All the records are reviewed and case discussed with ED provider. Management plans discussed with the patient, family and they are in agreement.  CODE STATUS: full code.  Code Status History    Date Active Date Inactive Code Status Order ID Comments User Context   02/17/2015  7:43 PM 02/20/2015  4:07 PM Full Code [id number]  Almyra Deforest, [place] In[facility]   11/11/2013  7:27 PM 11/13/2013  4:47 PM Full Code [id number]  Barton Dubois, MD Inpatient   12/06/2012 12:46 AM 12/12/2012  1:47 PM Full Code [id number]  Burnell Blanks, MD Inpatient       TOTAL TIME TAKING CARE OF THIS PATIENT: 50 critical care  minutes.  I spoke to patient's wife and grandson in the room and nephrologist and intensivist on the phone.   I explained patient's wife and grandson about his critical situation and about poor prognosis.  Vaughan Basta M.D on March 19, 2017   Between 7am to 6pm - Pager - [phone]  After 6pm go to [email or web address] - password [email or web address] [facility]  Office  [phone]  CC: Primary care physician; Tonia Ghent, MD   Note: This dictation was prepared with Dragon dictation along with smaller phrase technology. Any transcriptional errors that result from this process are unintentional.

## 2017-04-12 DEATH — deceased

## 2018-01-06 IMAGING — CT CT CHEST W/O CM
2 of 3 series · 15 of 36 positions shown, 18 images · non-contrast
Comparison: CT chest from [HOSPITAL] Tayy Quartey in [HOSPITAL]
dated 03/21/2016

CLINICAL DATA: Followup of pulmonary nodules

EXAM:
CT CHEST WITHOUT CONTRAST
TECHNIQUE: Multidetector CT imaging of the chest was performed following the
standard protocol without IV contrast.

[Series 2: thorax · axial · 0.67mm/px · z∈[-579,-293]mm · 12 of 169 slices shown, 15 images]
[im 13/169  mediastinal]
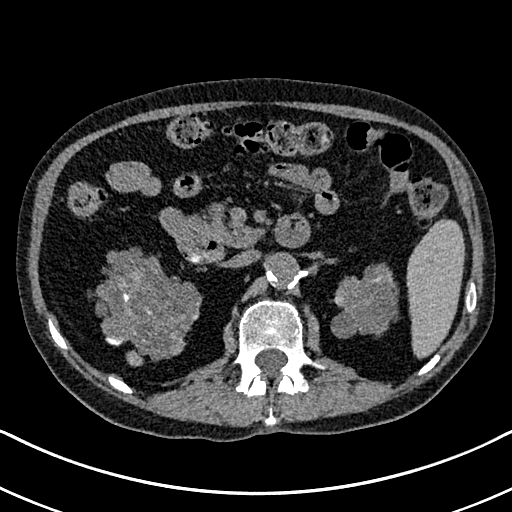
[im 13/169  lung]
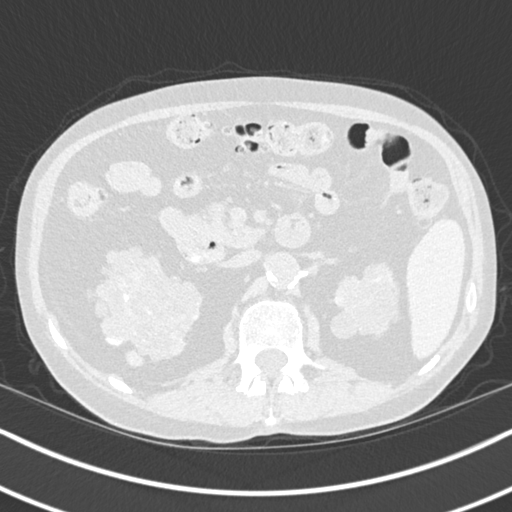
[im 25/169  lung]
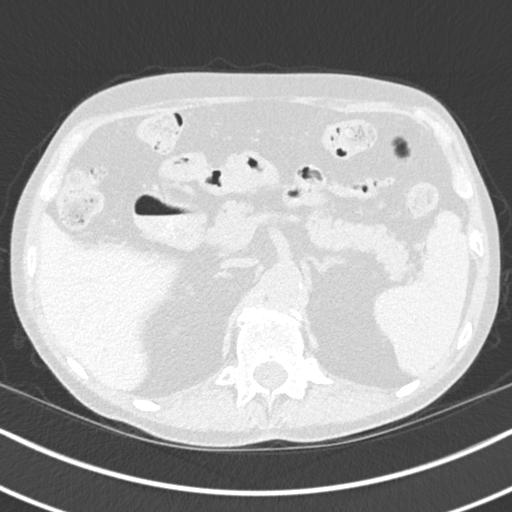
[im 38/169  lung]
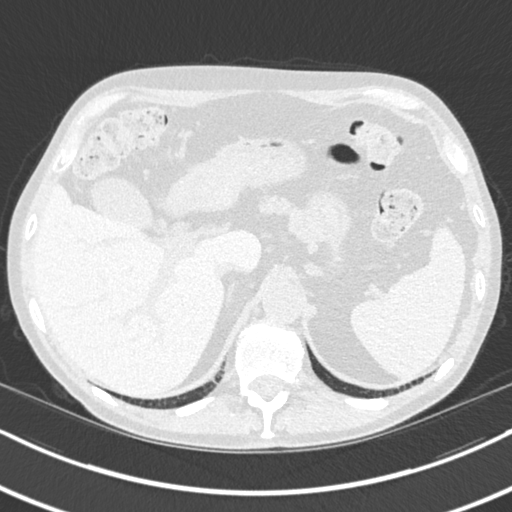
[im 50/169  lung]
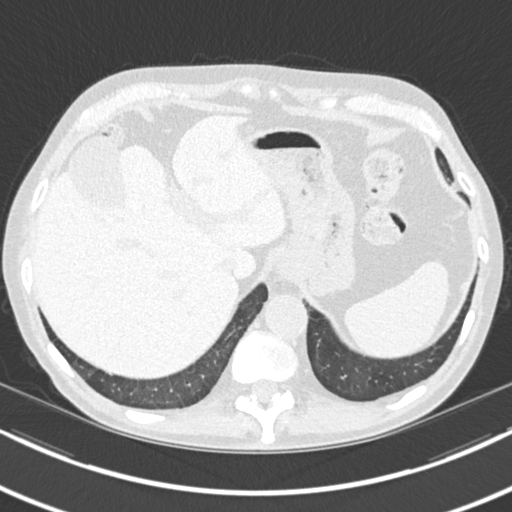
[im 63/169  mediastinal]
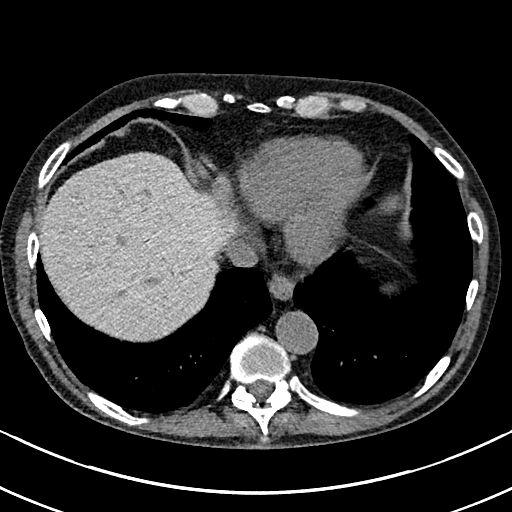
[im 63/169  lung]
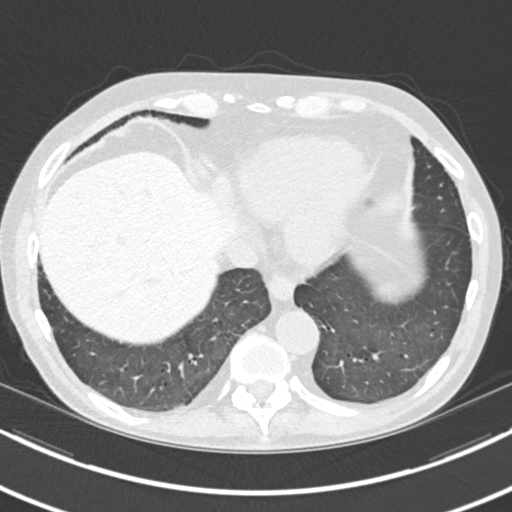
[im 75/169  lung]
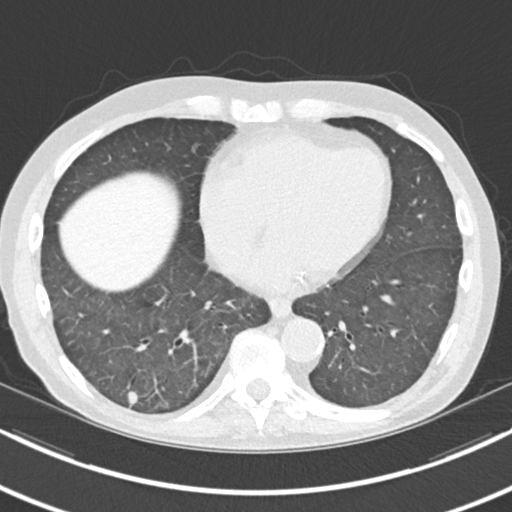
[im 94/169  lung]
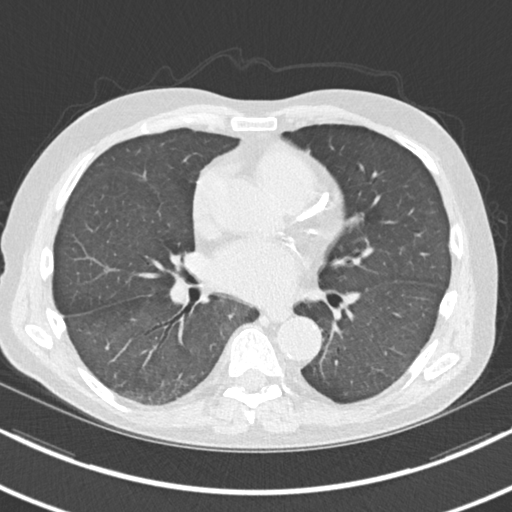
[im 106/169  lung]
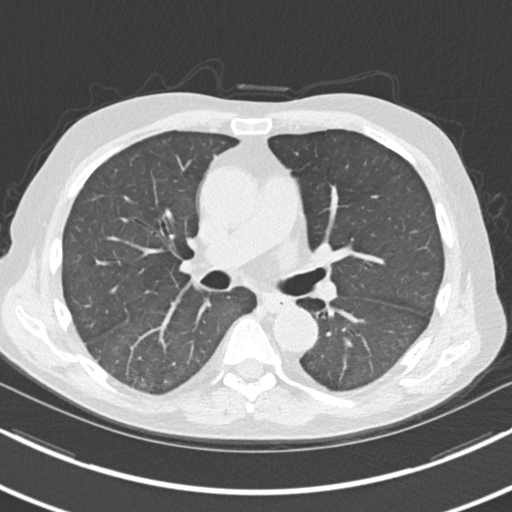
[im 119/169  mediastinal]
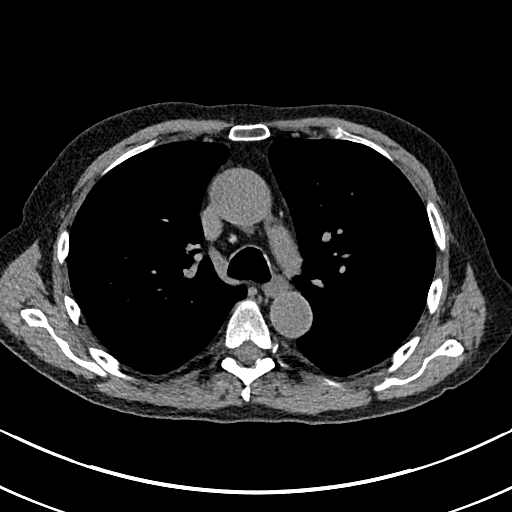
[im 119/169  lung]
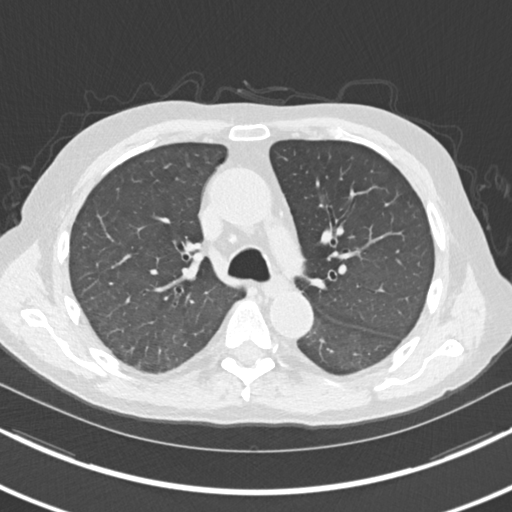
[im 131/169  lung]
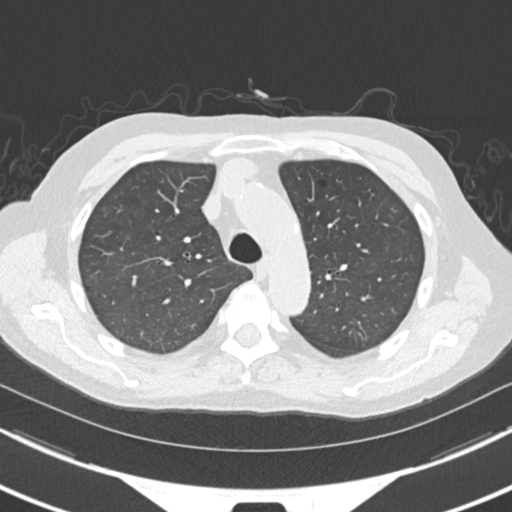
[im 144/169  lung]
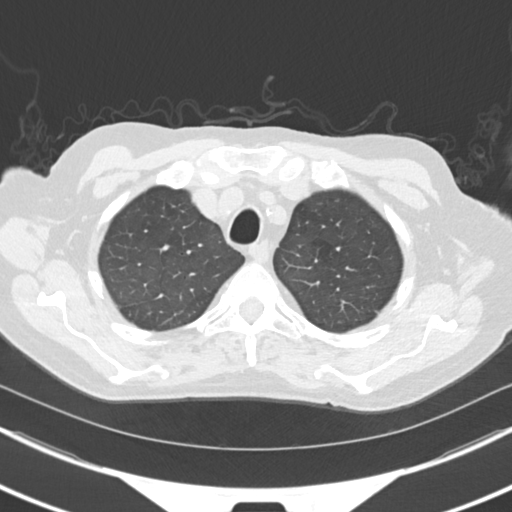
[im 156/169  lung]
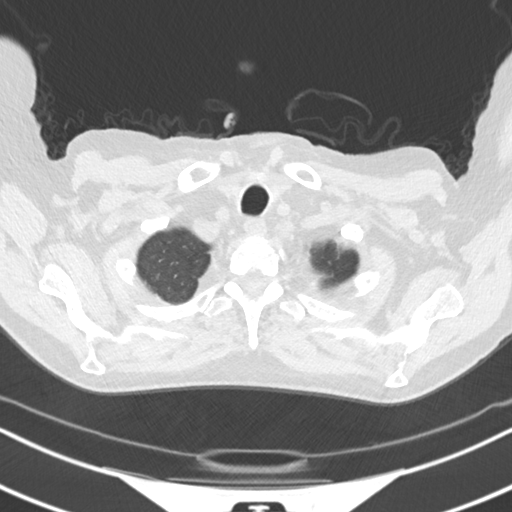

[Series 5: coronal · coronal · 0.66mm/px · 3 of 127 slices shown]
[im 26/127  lung]
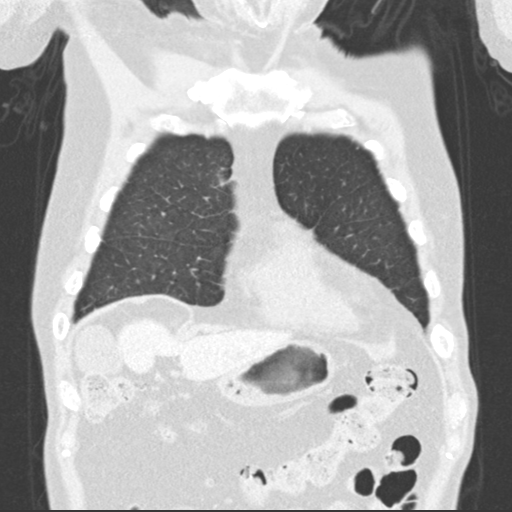
[im 51/127  lung]
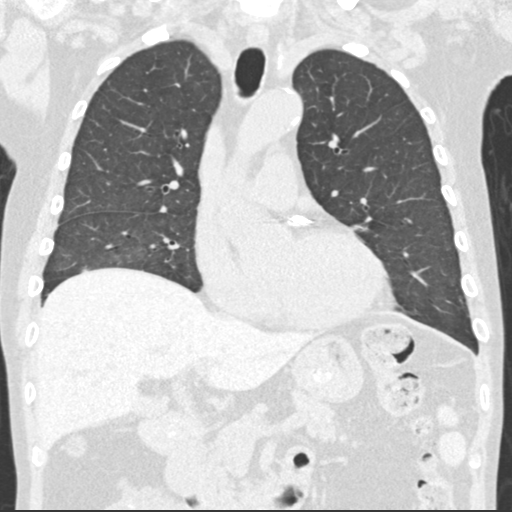
[im 76/127  lung]
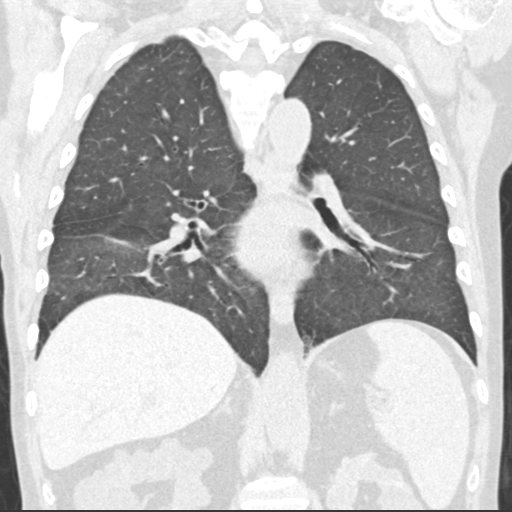

[15 of 36 positions shown; findings below may reference images not displayed]

FINDINGS: Cardiovascular: The heart remains mildly enlarged. Calcification of
the coronary arteries is noted diffusely. No pericardial effusion is
seen. The mid ascending thoracic aorta measures 39 mm in diameter.
Attention to the ascending aorta is recommended on follow-up exams.
Moderate thoracic aortic atherosclerosis is present. The pulmonary
arteries are unremarkable on this unenhanced study.

Mediastinum/Nodes: No mediastinal or hilar adenopathy is seen. There
may be a small hiatal hernia present.

Lungs/Pleura: As noted on the recent CT from [HOSPITAL] [facility] of
03/21/2016, there is no change in the noncalcified right lower lobe
pulmonary nodules. The larger nodule measures 8 mm in diameter
posteriorly in the right lower lobe on image 94 series 3. The small
nodules more caudal measuring 5 mm also in the posterior right lower
lobe on image 96. Additionally a tiny nodule is noted just abutting
the right hemidiaphragm in the right middle lobe of 6 mm in
diameter. This nodule is not definitely seen on the prior CT. I have
reviewed a dictation from the CT of the chest also from [HOSPITAL] [facility]
[place] from CT of the chest of 10/23/2013 describing noncalcified
nodules in the right lower lobe of 6 and 11 mm in diameter. I do not
accurate, the 2 previously described nodules have remained stable
for over 2 years and would therefore be considered benign. However,
in view of the apparent new nodule abutting the right hemidiaphragm
of 6 mm in diameter, followup is again recommended in the next 6-12
months.

Upper Abdomen: Images through the upper abdomen show the liver
parenchyma to be somewhat dense. This may be within upper limits of
normal, but can be seen with other entities such as hemochromatosis,
Wilson's disease, glycogen storage disease, and long-term amiodarone
therapy. Clinical correlation is recommended. In addition, multiple
renal cysts are present replacing much of the renal parenchyma
consistent with polycystic kidney disease.

Musculoskeletal: The thoracic vertebrae are in normal alignment with
normal intervertebral disc spaces.
IMPRESSION: 1. The 2 previously described noncalcified lung nodules in the right
lower lobe are stable and most likely benign.
2. There is however a new poorly defined nodule of 6 mm in diameter
abutting the right hemidiaphragm more anteriorly which is not seen
on previous CT. Therefore consider followup CT of the chest in 6-12
months.
3. Somewhat dense liver parenchyma as described above.
Considerations are given above.
4. Diffuse coronary artery calcifications.
5. Somewhat prominent diameter of the mid ascending thoracic aorta.
Recommend attention to this area on follow-up CT.
6. Moderate thoracic aortic atherosclerosis.
7. Changes of polycystic kidney disease.
